# Patient Record
Sex: Female | Born: 1965 | Race: White | Hispanic: No | Marital: Married | Smoking: Never smoker
Health system: Southern US, Community
[De-identification: ages and names within clinical notes are randomized; demographics above are authoritative.]

## PROBLEM LIST (undated history)

## (undated) DIAGNOSIS — R87619 Unspecified abnormal cytological findings in specimens from cervix uteri: Secondary | ICD-10-CM

## (undated) DIAGNOSIS — I10 Essential (primary) hypertension: Secondary | ICD-10-CM

## (undated) DIAGNOSIS — K219 Gastro-esophageal reflux disease without esophagitis: Secondary | ICD-10-CM

## (undated) DIAGNOSIS — R102 Pelvic and perineal pain unspecified side: Secondary | ICD-10-CM

## (undated) DIAGNOSIS — J45909 Unspecified asthma, uncomplicated: Secondary | ICD-10-CM

## (undated) DIAGNOSIS — N8189 Other female genital prolapse: Secondary | ICD-10-CM

## (undated) DIAGNOSIS — Z87442 Personal history of urinary calculi: Secondary | ICD-10-CM

## (undated) DIAGNOSIS — IMO0002 Reserved for concepts with insufficient information to code with codable children: Secondary | ICD-10-CM

## (undated) DIAGNOSIS — N6019 Diffuse cystic mastopathy of unspecified breast: Secondary | ICD-10-CM

## (undated) DIAGNOSIS — N760 Acute vaginitis: Secondary | ICD-10-CM

## (undated) DIAGNOSIS — D3910 Neoplasm of uncertain behavior of unspecified ovary: Secondary | ICD-10-CM

## (undated) DIAGNOSIS — N200 Calculus of kidney: Secondary | ICD-10-CM

## (undated) DIAGNOSIS — S060X9A Concussion with loss of consciousness of unspecified duration, initial encounter: Secondary | ICD-10-CM

## (undated) HISTORY — DX: Acute vaginitis: N76.0

## (undated) HISTORY — DX: Calculus of kidney: N20.0

## (undated) HISTORY — DX: Pelvic and perineal pain: R10.2

## (undated) HISTORY — DX: Reserved for concepts with insufficient information to code with codable children: IMO0002

## (undated) HISTORY — DX: Unspecified abnormal cytological findings in specimens from cervix uteri: R87.619

## (undated) HISTORY — DX: Diffuse cystic mastopathy of unspecified breast: N60.19

## (undated) HISTORY — DX: Personal history of urinary calculi: Z87.442

## (undated) HISTORY — DX: Other female genital prolapse: N81.89

## (undated) HISTORY — DX: Pelvic and perineal pain unspecified side: R10.20

## (undated) HISTORY — DX: Gastro-esophageal reflux disease without esophagitis: K21.9

## (undated) HISTORY — DX: Essential (primary) hypertension: I10

## (undated) HISTORY — PX: CHOLECYSTECTOMY: SHX55

## (undated) HISTORY — DX: Neoplasm of uncertain behavior of unspecified ovary: D39.10

---

## 1998-07-16 ENCOUNTER — Other Ambulatory Visit: Admission: RE | Admit: 1998-07-16 | Discharge: 1998-07-16 | Payer: Self-pay | Admitting: Obstetrics and Gynecology

## 1999-05-06 ENCOUNTER — Encounter: Admission: RE | Admit: 1999-05-06 | Discharge: 1999-05-06 | Payer: Self-pay | Admitting: General Surgery

## 1999-05-06 ENCOUNTER — Encounter: Payer: Self-pay | Admitting: General Surgery

## 1999-09-05 ENCOUNTER — Other Ambulatory Visit: Admission: RE | Admit: 1999-09-05 | Discharge: 1999-09-05 | Payer: Self-pay | Admitting: Obstetrics and Gynecology

## 2000-12-22 ENCOUNTER — Other Ambulatory Visit: Admission: RE | Admit: 2000-12-22 | Discharge: 2000-12-22 | Payer: Self-pay | Admitting: Obstetrics and Gynecology

## 2001-04-15 ENCOUNTER — Encounter: Payer: Self-pay | Admitting: Obstetrics and Gynecology

## 2001-04-15 ENCOUNTER — Ambulatory Visit (HOSPITAL_COMMUNITY): Admission: RE | Admit: 2001-04-15 | Discharge: 2001-04-15 | Payer: Self-pay | Admitting: Obstetrics and Gynecology

## 2002-04-27 ENCOUNTER — Encounter: Admission: RE | Admit: 2002-04-27 | Discharge: 2002-04-27 | Payer: Self-pay | Admitting: Obstetrics and Gynecology

## 2002-04-27 ENCOUNTER — Encounter: Payer: Self-pay | Admitting: Obstetrics and Gynecology

## 2002-06-08 HISTORY — PX: OOPHORECTOMY: SHX86

## 2002-06-14 ENCOUNTER — Other Ambulatory Visit: Admission: RE | Admit: 2002-06-14 | Discharge: 2002-06-14 | Payer: Self-pay | Admitting: Obstetrics and Gynecology

## 2003-06-09 DIAGNOSIS — D3910 Neoplasm of uncertain behavior of unspecified ovary: Secondary | ICD-10-CM

## 2003-06-09 HISTORY — DX: Neoplasm of uncertain behavior of unspecified ovary: D39.10

## 2003-06-19 ENCOUNTER — Other Ambulatory Visit: Admission: RE | Admit: 2003-06-19 | Discharge: 2003-06-19 | Payer: Self-pay | Admitting: Obstetrics and Gynecology

## 2003-06-22 ENCOUNTER — Ambulatory Visit (HOSPITAL_COMMUNITY): Admission: RE | Admit: 2003-06-22 | Discharge: 2003-06-22 | Payer: Self-pay | Admitting: Obstetrics and Gynecology

## 2003-06-29 ENCOUNTER — Ambulatory Visit (HOSPITAL_COMMUNITY): Admission: RE | Admit: 2003-06-29 | Discharge: 2003-06-29 | Payer: Self-pay | Admitting: Obstetrics and Gynecology

## 2003-07-03 ENCOUNTER — Ambulatory Visit: Admission: RE | Admit: 2003-07-03 | Discharge: 2003-07-03 | Payer: Self-pay | Admitting: Gynecology

## 2003-07-10 ENCOUNTER — Inpatient Hospital Stay (HOSPITAL_COMMUNITY): Admission: RE | Admit: 2003-07-10 | Discharge: 2003-07-12 | Payer: Self-pay | Admitting: Obstetrics and Gynecology

## 2003-07-10 ENCOUNTER — Encounter (INDEPENDENT_AMBULATORY_CARE_PROVIDER_SITE_OTHER): Payer: Self-pay | Admitting: Specialist

## 2003-08-07 ENCOUNTER — Ambulatory Visit: Admission: RE | Admit: 2003-08-07 | Discharge: 2003-08-07 | Payer: Self-pay | Admitting: Gynecology

## 2004-06-19 ENCOUNTER — Other Ambulatory Visit: Admission: RE | Admit: 2004-06-19 | Discharge: 2004-06-19 | Payer: Self-pay | Admitting: Obstetrics and Gynecology

## 2004-07-02 ENCOUNTER — Encounter: Admission: RE | Admit: 2004-07-02 | Discharge: 2004-07-02 | Payer: Self-pay | Admitting: Family Medicine

## 2004-09-12 ENCOUNTER — Encounter (INDEPENDENT_AMBULATORY_CARE_PROVIDER_SITE_OTHER): Payer: Self-pay | Admitting: Specialist

## 2004-09-12 ENCOUNTER — Observation Stay (HOSPITAL_COMMUNITY): Admission: RE | Admit: 2004-09-12 | Discharge: 2004-09-13 | Payer: Self-pay | Admitting: General Surgery

## 2004-12-26 ENCOUNTER — Ambulatory Visit (HOSPITAL_COMMUNITY): Admission: RE | Admit: 2004-12-26 | Discharge: 2004-12-26 | Payer: Self-pay | Admitting: Obstetrics and Gynecology

## 2005-07-02 ENCOUNTER — Other Ambulatory Visit: Admission: RE | Admit: 2005-07-02 | Discharge: 2005-07-02 | Payer: Self-pay | Admitting: Obstetrics and Gynecology

## 2006-01-04 ENCOUNTER — Ambulatory Visit (HOSPITAL_COMMUNITY): Admission: RE | Admit: 2006-01-04 | Discharge: 2006-01-04 | Payer: Self-pay | Admitting: Obstetrics and Gynecology

## 2007-01-12 ENCOUNTER — Ambulatory Visit (HOSPITAL_COMMUNITY): Admission: RE | Admit: 2007-01-12 | Discharge: 2007-01-12 | Payer: Self-pay | Admitting: Obstetrics and Gynecology

## 2008-04-19 ENCOUNTER — Ambulatory Visit: Payer: Self-pay | Admitting: Internal Medicine

## 2008-04-20 ENCOUNTER — Ambulatory Visit (HOSPITAL_COMMUNITY): Admission: RE | Admit: 2008-04-20 | Discharge: 2008-04-20 | Payer: Self-pay | Admitting: Obstetrics and Gynecology

## 2010-10-24 NOTE — Op Note (Signed)
NAME:  Dawn Russell, Dawn Russell                         ACCOUNT NO.:  [id number]   MEDICAL RECORD NO.:  [id number]                   PATIENT TYPE:  INP   LOCATION:  0447                                 FACILITY:  [facility]   PHYSICIAN:  Janine Limbo, M.D.            DATE OF BIRTH:  11/07/1965   DATE OF PROCEDURE:  07/10/2003  DATE OF DISCHARGE:                                 OPERATIVE REPORT   PREOPERATIVE DIAGNOSIS:  Right ovarian pelvic mass.   POSTOPERATIVE DIAGNOSIS:  Borderline serous right ovarian carcinoma.   PROCEDURES:  1. Exploratory laparotomy.  2. Right salpingo-oophorectomy.   SURGEON:  Janine Limbo, M.D.   FIRST ASSISTANT:  De Blanch, M.D., and Telford Nab, R.N.   ANESTHESIA:  General.   DISPOSITION:  Dawn Russell is a 45 year old female, para 0-2-0-2, who presents  with a right ovarian mass discovered at her routine annual exam.  Ultrasound  showed an 11.8 x 8.0 cm right adnexal complex cystic mass with nodules.  Her  CA125 was at the upper level of normal at 24.9.  The patient understands the  indications for her surgical procedure and she accepts the risks of, but not  limited to, anesthetic complications, bleeding, infection, and possible  damage to the surrounding organs.  The patient understands that if a  malignancy is found that we may need to proceed with lymph node dissection,  omentectomy, selected pelvic biopsies, and possible bowel surgery.  The  patient admits that she is not yet ready to say that she does not ever want  any more children, and therefore she will not have her tubes tied and will  maintain her potential fertility unless absolutely necessary.   FINDINGS:  The patient was found to have a 12 cm multicystic right adnexal  mass involving the entire right ovary.  The capsule was smooth and there was  no evidence of tumors or excrescences.  The fallopian tubes were normal  bilaterally.  The left ovary was completely normal.   The uterus was  completely normal.  With exploration of the abdomen, the liver was normal.  The surface was smooth, and there was no evidence of tumor.  The omentum was  normal.  The bowel was run and there was no evidence of masses along the  bowel.  The pelvis was carefully inspected, and there was no evidence of  tumor in the pelvis.  The appendix was identified, and it was completely  normal.   DESCRIPTION OF PROCEDURE:  The patient was taken to the operating room,  where a general anesthetic was given.  The patient's abdomen, perineum, and  vagina were prepped with multiple layers of Betadine.  Examination under  anesthesia was performed.  The mass was detected as mentioned above.  A  catheter was placed in the bladder.  The patient was sterilely draped.  A  low transverse incision was made in the abdomen and carried sharply  through  the subcutaneous tissue, the fascia, and the anterior peritoneum.  Washings  were obtained from the pelvis.  The abdominal contents were explored with  findings as mentioned above.  The Bookwalter retractor was used for  retraction.  The mass was elevated into the operative field.  The right  retroperitoneal space was opened and the right infundibulopelvic ligament  was isolated.  The ureter was identified and followed through its course.  The right infundibulopelvic ligament was isolated, doubly clamped, cut, free-  tied, and suture ligated.  The right ovary was then skeletonized and the  right utero-ovarian ligament was then doubly clamped and cut.  The specimen  was carefully inspected and then sent to pathology for frozen section.  The  right utero-ovarian ligament was then doubly sutured and tied.  The pelvis  was carefully explored while we were awaiting the frozen section report.  The frozen section report returned showing a borderline serous ovarian  carcinoma.  There was no evidence of tumor on the external surface of the  ovarian specimen.   This having been completed, the pelvis was vigorously  irrigated.  Hemostasis was again confirmed.  All instruments were removed.  The anterior peritoneum was closed using a running suture of 0 Vicryl.  The  fascia was closed using two running sutures of 0 Vicryl from the corners to  the midline.  A Jackson-Pratt drain was placed in the subcutaneous space and  brought out through the right lower quadrant.  It was sutured into place  using silk suture.  The subcutaneous layer was closed using 2-0 Vicryl.  The  skin was reapproximated using skin staples.  The sponge, needle, and  instrument counts were correct on two occasions.  The estimated blood loss  was 100 mL.  The patient was noted to drain clear yellow urine at the end of  her procedure.  She was awakened from her anesthetic and taken to the  recovery room in stable condition.  The specimen will be re-evaluated for  permanent samples.                                               Janine Limbo, M.D.    AVS/MEDQ  D:  07/10/2003  T:  07/10/2003  Job:  [id number]   cc:   De Blanch, M.D.

## 2010-10-24 NOTE — H&P (Signed)
NAME:  Dawn Russell, Dawn Russell                         ACCOUNT NO.:  [id number]   MEDICAL RECORD NO.:  [id number]                   PATIENT TYPE:  OUT   LOCATION:  MAMO                                 [facility]ACILITY:  [facility]   PHYSICIAN:  Janine Limbo, M.D.            DATE OF BIRTH:  13-May-1966   DATE OF ADMISSION:  07/10/2003  DATE OF DISCHARGE:                                HISTORY & PHYSICAL   HISTORY OF PRESENT ILLNESS:  Dawn Russell is a 45 year old female, para 0-2-0-  2, who presents for an exploratory laparotomy and right salpingo-  oophorectomy.  The patient has been followed at the [facility].  The  patient was in her usual condition of good health when she presented for her  annual exam on June 19, 2003.  She was found to have a cystic right  adnexal mass.  An ultrasound was performed on that date which showed an 11.5  cm cystic mass on the right ovary.  A CT scan with contrast was obtained and  the CT scan showed an 11.8 x 8.0 x 7.3 cm right adnexal complex cystic mass.  The mass is composed of several cystic portions, some of which do contain  mural nodules.  The left ovary measured 3.7 x 3.7 x 4.0 cm.  There was no  evidence of free fluid or pelvic adenopathy.  There was no evidence of  hydronephrosis.  The uterus appeared normal.  The patient's most recent Pap  smear was in January 2005 and it was within normal limits.  The patient uses  rhythm method and condoms for contraception.  The patient did have a CT scan  with contrast in November 2002 because of abdominal pain.  At that time  there was no evidence of acute abnormality in the abdomen.  The right and  left ovary were both said to be slightly enlarged but upon measurement the  right ovary measured 4.8 x 3.5 cm and it contained several follicles.  The  left ovary measured 5.0 x 3.0 cm.  No discrete masses were identified.  The  patient did have a  cesarean section in November 1998 at [redacted] weeks gestation  because of preterm labor and because of a breech presentation.  The  patient's gynecologic history is otherwise largely uncomplicated.  She  denies GI and GU symptomatology other than occasional pelvic discomfort.  She does state for the past 4-6 months that she is sometimes uncomfortable  when she bends over.   OBSTETRICAL HISTORY:  In August 1993 the patient had a vaginal delivery of a  5-pound 13-ounce female infant at [redacted] weeks gestation.  In 1998 the patient had  a cesarean section at 35-1/[redacted] weeks gestation because of a breech infant  where she delivered a 5-pound 2-ounce female infant.   DRUG ALLERGIES:  The patient reports that she is allergic to SULFA  MEDICATIONS and AMOXICILLIN - they cause a rash.   PAST MEDICAL HISTORY:  The patient has asthma and she uses an inhaler p.r.n.  Generally speaking she does well.  Change of temperature can cause her  difficulties.  She has been told in the past that she has sludge in her  gallbladder.  The patient denies hypertension and diabetes.   SOCIAL HISTORY:  The patient denies cigarette use, alcohol use, and  recreational drug use.   REVIEW OF SYSTEMS:  Please see history of present illness.   FAMILY HISTORY:  The patient's father died from an aneurysm.  The patient's  twin sister died from a brain tumor at age 33.   PHYSICAL EXAMINATION:  VITAL SIGNS:  Weight is 161 pounds.  HEENT:  Within normal limits.  CHEST:  Clear.  HEART:  Regular rate and rhythm.  BREASTS:  Fibrocytic changes but no discrete masses are appreciated.  BACK:  No CVA tenderness.  ABDOMEN:  Nontender and no adenopathy is present.  There is not a fluid wave  present.  EXTREMITIES:  Within normal limits.  NEUROLOGIC:  Grossly normal.  PELVIC:  External genitalia is normal.  The vagina is normal except for a  small cystocele and rectocele.  The cervix is nontender and no lesions are  appreciated.  The uterus  is normal size, shape, and consistency.  Adnexa  there is an approximately 8-10 cm right cystic mass present that is not  particularly tender.  The left adnexa is clear and nontender.  Rectovaginal  exam confirms the above.   LABORATORY VALUES:  Hemoccult is negative.  CA 125 is 24.9 (normal).  Her  hemoglobin is 14.7, her hematocrit is 44.9, white blood cell count is  10,000, platelet count is 299,000.  Chemistries are within normal limits  except for a glucose of 113.   ASSESSMENT:  An 11 cm complex cystic mass on the right with mural nodules.  Must rule out ovarian cancer.   PLAN:  1. A long discussion was held with the patient and her husband about     possible etiologies for the mass.  The patient understands that the     lesion may be completely benign and that a right salpingo-oophorectomy     may be all that is required.  The patient also understands that there is     a possibility that there is an ovarian malignancy and that we will need     to proceed with a total abdominal hysterectomy, bilateral salpingo-     oophorectomy, lymph node dissection, omentectomy, tumor debulking, and     possible bowel surgery.  The risks of surgery were reviewed including     anesthetic complications, bleeding, infections, and possible damage to     the surrounding organs.  2. The patient will see Dr. De Blanch (GYN oncologist) in     preparation for surgery.                                               Janine Limbo, M.D.    AVS/MEDQ  D:  06/28/2003  T:  06/29/2003  Job:  [id number]   cc:   De Blanch, M.D.   Leonie Man, M.D.  [address]  [address]  [place]  [place] [address]  Fax: [phone]

## 2010-10-24 NOTE — Consult Note (Signed)
NAME:  Dawn Russell, Dawn Russell                         ACCOUNT NO.:  [id number]   MEDICAL RECORD NO.:  [id number]                   PATIENT TYPE:  INP   LOCATION:  NA                                   FACILITY:  [facility]   PHYSICIAN:  De Blanch, M.D.         DATE OF BIRTH:  1965/10/12   DATE OF CONSULTATION:  DATE OF DISCHARGE:                                   CONSULTATION   A 45 year old white female seen in consultation at the request of Dr. Marline Backbone regarding a newly diagnosed complex mass. The patient apparently  was essentially asymptomatic, but on routine exam was found to have a pelvic  mass. This was further typified with an ultrasound showing an 11.5 cm cystic  mass in the right ovary. CT scan was subsequently obtained showing the right  adnexal complex cystic mass composed of several cystic portions which do  contain some mural nodules. There is no evidence of free fluid or pelvic  adenopathy and the upper abdomen appears to be normal. The patient has had a  CA-125 value which was normal (24.9 units/ml). She does note some pelvic  pressure, but has really no other gynecologic symptoms. Her gynecologic  history is uncomplicated and negative.  Obstetrical history of gravida 2.  She delivered her second child by Cesarean section at 35.5 weeks.   DRUG ALLERGIES:  SULFA and AMOXICILLIN.   PAST SURGICAL HISTORY:  Cesarean section.   PAST MEDICAL HISTORY:  Asthma. She uses an inhaler p.r.n.   SOCIAL HISTORY:  The patient is married. She does not smoke.   REVIEW OF SYSTEMS:  Negative except as noted above.   PHYSICAL EXAMINATION:  VITAL SIGNS: Weight 157 pounds.  GENERAL: The patient is a healthy white female in no acute distress.  HEENT: Negative.  NECK: Supple without thyromegaly. There is no supraclavicular or inguinal  adenopathy.  ABDOMEN: Soft and nontender with no mass, organomegaly, ascites, or hernias  noted.  PELVIC EXAM: EGBUS, vagina, bladder, and  urethra are normal. The cervix is  normal. The uterus is anterior and small. Normal shape, size, and  consistency. There is a cystic mass posterior to the uterus measuring  approximately 9 cm in diameter. This is minimally tender. Rectovaginal exam  confirmatory.   IMPRESSION:  Complex cystic mass with mural nodules in a young woman with  normal CA-125. I believe it is likely this is a benign mass, but nonetheless  it needs to be removed and evaluated pathologically. The patient is strongly  desirous of preserving the uterus and a normal ovary if possible. She  understands that if both ovaries are involved she will have a total  abdominal hysterectomy and bilateral salpingo-oophorectomy.  Further she  understands the extent of surgical staging necessary if this turns out to be  an ovarian cancer which would include pelvic and periaortic lymphadenectomy  and omentectomy, peritoneal washings, and biopsies.   Given the fact that this is  most likely benign, I am comfortable if we make  proceed through a Pfannenstiel incision. The patient understands that if  this is a malignancy and requires surgery in the upper abdomen, a second  midline incision will be performed.   We will proceed to coordinate surgery with Dr. Marline Backbone. The risks of  surgery including hemorrhage, infection, injury to adjacent viscera,  thromboembolic complications, and anesthetic risks were outlined to the  patient and her husband. All their questions were answered.                                               De Blanch, M.D.    DC/MEDQ  D:  07/03/2003  T:  07/03/2003  Job:  [id number]

## 2010-10-24 NOTE — Consult Note (Signed)
NAME:  ELYSABETH, Dawn Russell                         ACCOUNT NO.:  [id number]   MEDICAL RECORD NO.:  [id number]                   PATIENT TYPE:  INP   LOCATION:  0447                                 [facility]ACILITY:  [facility]   PHYSICIAN:  De Blanch, M.D.         DATE OF BIRTH:  March 22, 1966   DATE OF CONSULTATION:  07/10/2003  DATE OF DISCHARGE:                                   CONSULTATION   The patient has been found to have a papillary serous borderline tumor of  the right ovary based on frozen section diagnosis by Dr. Charlott Rakes.  The  patient was explored including the liver capsule, diaphragm, spleen,  stomach, omentum, small and large bowel as well as pelvic peritoneal  surfaces, left tube and ovary, and uterus.  There is no evidence of  intraperitoneal or retroperitoneal adenopathy or other metastatic disease.  Peritoneal washings were previously obtained at the time of opening the  abdomen and have been submitted to cytopathology.   Preoperative discussion with the patient indicated that she strongly desires  to preserving the uterus, tube, and ovary as well as preserving fertility.  Therefore, further surgical resection will not be performed given what would  be expected to be an excellent prognosis of a stage IA serous borderline  tumor with no evidence of gross metastatic disease.                                               De Blanch, M.D.    DC/MEDQ  D:  07/11/2003  T:  07/11/2003  Job:  [id number]   cc:   Telford Nab, R.N.  [address]  [place], [place] [address]

## 2010-10-24 NOTE — Op Note (Signed)
NAMEPAYDEN, BONUS NO.:  [id number]   MEDICAL RECORD NO.:  [id number]          PATIENT TYPE:  AMB   LOCATION:  DAY                          FACILITY:  [facility]   PHYSICIAN:  Adolph Pollack, M.D.DATE OF BIRTH:  09/22/1965   DATE OF PROCEDURE:  09/12/2004  DATE OF DISCHARGE:                                 OPERATIVE REPORT   PREOPERATIVE DIAGNOSIS:  Symptomatic cholelithiasis.   POSTOPERATIVE DIAGNOSIS:  Cholelithiasis and chronic cholecystitis.   PROCEDURE:  Laparoscopic cholecystectomy with intraoperative cholangiogram.   SURGEON:  Adolph Pollack, M.D.   ASSISTANT:  Gabrielle Dare. Janee Morn, M.D.   ANESTHESIA:  General.   INDICATIONS FOR PROCEDURE:  Ms. Durrett is a 45 year old female who is having  episodes of right upper quadrant abdominal pain.  Ultrasound demonstrates  several gallstone and a small gallbladder polyp.  She now presents for  elective laparoscopic cholecystectomy.  The procedure and the risks were  discussed with her preoperatively.   DESCRIPTION OF PROCEDURE:  She was seen in the holding area and then brought  to the operating room and placed supine on the operating table.  General  anesthetic was administered.  Her abdominal wall was then sterilely prepped  and draped.  A dilute Marcaine solution was infiltrated in the subumbilical  area.  She was LATEX SENSITIVE, so latex-free gloves and equipment was used.   A small incision was made in the subumbilical region and carried through the  skin and subcutaneous tissues and fascia.  The peritoneal cavity was entered  under direct vision.  A pursestring suture of 0 Vicryl was placed around the  fascia edges.  A Hasson trocar was introduced to the peritoneal cavity, and  pneumoperitoneum was created by insufflation of CO2 gas.   Next, the laparoscope was introduced.  She was placed in the reverse  Trendelenburg position with the right side tilted slightly upward.  An 11-mm  trocar was  placed through an epigastric incision, and two 5-mm trocars were  placed into the right midlateral abdomen.  The fundus of the gallbladder was  grasped.  Filmy adhesions between the duodenum and omentum were noted, and  these were lysed sharply.  No injury to the duodenum was noted.  The fundus  was then retracted towards the right shoulder.  The infundibulum was grasped  and mobilized using careful blunt dissection and select cautery.  I then  identified the neck of the gallbladder and its junction with the cystic  duct.  I created a window around the cystic duct.  I placed a clip at the  cystic duct/gallbladder junction and made a small incision in the cystic  duct and bile was milked back.  A cholangiocatheter was placed through the  anterior abdominal wall into the cystic duct, and a cholangiogram was  performed.   Under real-time fluoroscopy, dilute contrast material was injected into the  cystic duct which was of moderate length.  The common bile ducts all filled  properly.  Contrast drained into the duodenum rapidly without obvious  evidence of obstruction.  Final report is pending radiologist  interpretation.  Following this, the cholangiocatheter was removed.  The cystic duct was  clipped three times proximally and divided.  An anterior branch of the  cystic artery was identified, clipped, and divided.  The posterior branch  was clipped and divided.  The gallbladder was dissected free from the liver  bed intact with the electrocautery and placed in an Endopouch bag.   The gallbladder fossa was irrigated and bleeding points controlled with the  cautery.  The area was irrigated and inspected again, and no bleeding or  bile leak was noted.  The gallbladder was then removed through the  subumbilical port in the bag.  The subumbilical fascial defect was closed  with no laparoscopic vision by tightening up and tying down the pursestring  suture.  The perihepatic area was irrigated,  and fluid was evacuated.  Following this, the trocars were removed and pneumoperitoneum released.  The  skin incisions were closed with 4-0 Monocryl subcuticular sutures followed  by Steri-Strips and sterile dressings.   She tolerated the procedure without any apparent complications and was taken  to the recovery room in satisfactory condition.      TJR/MEDQ  D:  09/12/2004  T:  09/12/2004  Job:  [id number]   cc:   Caryn Bee L. Little, M.D.  [address]  [place]  [place] [address]  Fax: [phone]

## 2010-10-24 NOTE — Discharge Summary (Signed)
NAME:  Dawn Russell, Dawn Russell                         ACCOUNT NO.:  [id number]   MEDICAL RECORD NO.:  [id number]                   PATIENT TYPE:  INP   LOCATION:  0447                                 FACILITY:  [facility]   PHYSICIAN:  Janine Limbo, M.D.            DATE OF BIRTH:  1966/01/30   DATE OF ADMISSION:  07/10/2003  DATE OF DISCHARGE:  07/12/2003                                 DISCHARGE SUMMARY   DISCHARGE DIAGNOSES:  1. 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  2. Asthma.   PROCEDURES THIS ADMISSION, July 10, 2003:  1. Exploratory laparotomy.  2. Right salpingo-oophorectomy.   HISTORY OF PRESENT ILLNESS:  Ms. Biddinger is a 45 year old female, para 0-2-0-  2, who presented for her routine annual exam in January of 2005 at which  time she was found to have a 10 to 12 cm right adnexal mass.  Ultrasound and  then CT scan confirmed a 12 cm complex cystic mass in the right ovary with  nodules.  Her CA-125 was within normal limits.   PHYSICAL EXAMINATION:  VITAL SIGNS:  Stable.  GENERAL:  Within normal limits.  PELVIC:  Significant for a 10 cm cystic mass in the right adnexa.   HOSPITAL COURSE:  On the day of admission, the patient underwent an  exploratory laparotomy where she was found to have a 12 cm multicystic mass  on the right ovary.  There were no excrescences on the external surface of  the ovary.  The pelvis was explored and there was no evidence of malignancy  in the pelvis or the abdominal cavity.  Washings were obtained that returned  showing benign cells.  Frozen section analysis of the specimen showed a  borderline carcinoma of the ovary.  Because the patient was not ready to end  her childbearing potential, we did not perform Dawn Russell additional surgery.  The  patient's postoperative course was uneventful.  Her postoperative hemoglobin  was 11.4 (preoperative hemoglobin was 13.5).  The patient quickly tolerated  her regular diet here and she remained  afebrile.  She was discharged to home  on postoperative day #2 doing well.   DISCHARGE MEDICATIONS:  The patient was given Vicodin and she will take one  or two tablets every 4 hours as needed for pain.  She was given Phenergan  and she will take 25 mg every 6 hours as needed for nausea.  She will also  take ibuprofen 600 mg q.6 h. as needed for mild to moderate pain.   DISCHARGE INSTRUCTIONS:  The patient will return to see Dr. Stefano Gaul in 4  weeks and Dr. Stanford Breed in 4 weeks for follow up examination.  She will  call for questions and concerns.  She will refrain from driving for 2 weeks,  heavy lifting for 4 weeks, and intercourse for 6 weeks.   FINAL PATHOLOGY REPORT:  Cytology:  No malignant cells identified, right  salpingo-oophorectomy,  benign right tube.  The right ovary has a borderline  papillary serous carcinoma without evidence of spread beyond the capsule.                                               Janine Limbo, M.D.    AVS/MEDQ  D:  07/12/2003  T:  07/12/2003  Job:  [id number]   cc:   De Blanch, M.D.

## 2010-10-24 NOTE — Consult Note (Signed)
NAME:  Dawn Russell, Dawn Russell                         ACCOUNT NO.:  [id number]   MEDICAL RECORD NO.:  [id number]                   PATIENT TYPE:  OUT   LOCATION:  GYN                                  FACILITY:  [facility]   PHYSICIAN:  De Blanch, M.D.         DATE OF BIRTH:  April 23, 1966   DATE OF CONSULTATION:  08/07/2003  DATE OF DISCHARGE:                                   CONSULTATION   REASON FOR CONSULTATION:  A 45 year old white female returns to discuss  pathology obtained at surgery on July 10, 2003.  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  The patient  was desirous of preserving fertility and the contralateral ovary if at all  possible which was achieved.  Final pathology showed that the tumor was  confined to the ovary with no evidence of capsular rupture or peritoneal  implants and washings were negative.   The patient has had an uncomplicated postoperative course.   I had a lengthy discussion with the patient and her husband regarding the  natural history of borderline tumors and recommendations for management.  At  the time being, I indicated that I thought the risk of recurrence is very  low, and would simply plan of her having her undergo ultrasound of the  opposite ovary approximately every six months.  They are aware that  functional cysts would be periodically detected which would need followup.  Given the fact that she did not have an elevated CA-125 preoperatively, I do  not think continuing to follow her with CA-125 values would be of any  significant benefit.  The patient will return to the care of Dr. Stefano Gaul,  and begin this follow up program as outlined above.                                               De Blanch, M.D.    DC/MEDQ  D:  08/07/2003  T:  08/07/2003  Job:  [id number]   cc:   Janine Limbo, M.D.  [address], [address]  [place]  [place] [address]  Fax: [phone]   Telford Nab, R.N.  [address]  [place], [place] [address]

## 2010-12-07 DIAGNOSIS — Z87442 Personal history of urinary calculi: Secondary | ICD-10-CM

## 2010-12-07 HISTORY — DX: Personal history of urinary calculi: Z87.442

## 2010-12-09 ENCOUNTER — Other Ambulatory Visit: Payer: Self-pay | Admitting: Internal Medicine

## 2010-12-11 ENCOUNTER — Ambulatory Visit: Payer: Self-pay | Admitting: Internal Medicine

## 2010-12-11 LAB — CA 125: CA 125: 12 U/mL

## 2010-12-27 LAB — HM PAP SMEAR: HM Pap smear: NORMAL

## 2011-01-01 ENCOUNTER — Ambulatory Visit (HOSPITAL_COMMUNITY): Payer: BC Managed Care – PPO

## 2011-01-01 ENCOUNTER — Ambulatory Visit (HOSPITAL_COMMUNITY)
Admission: RE | Admit: 2011-01-01 | Discharge: 2011-01-01 | Disposition: A | Discharge status: Home / Self Care | Payer: BC Managed Care – PPO | Source: Ambulatory Visit | Attending: Urology | Admitting: Urology

## 2011-01-01 DIAGNOSIS — N200 Calculus of kidney: Secondary | ICD-10-CM | POA: Insufficient documentation

## 2011-01-01 DIAGNOSIS — J701 Chronic and other pulmonary manifestations due to radiation: Secondary | ICD-10-CM | POA: Insufficient documentation

## 2011-01-01 DIAGNOSIS — I10 Essential (primary) hypertension: Secondary | ICD-10-CM | POA: Insufficient documentation

## 2011-01-01 DIAGNOSIS — R310 Gross hematuria: Secondary | ICD-10-CM | POA: Insufficient documentation

## 2011-01-01 DIAGNOSIS — J45909 Unspecified asthma, uncomplicated: Secondary | ICD-10-CM | POA: Insufficient documentation

## 2011-03-24 ENCOUNTER — Other Ambulatory Visit: Payer: Self-pay | Admitting: Internal Medicine

## 2011-03-30 ENCOUNTER — Encounter: Payer: Self-pay | Admitting: Internal Medicine

## 2011-03-30 ENCOUNTER — Other Ambulatory Visit: Payer: Self-pay | Admitting: Internal Medicine

## 2011-03-30 ENCOUNTER — Ambulatory Visit (INDEPENDENT_AMBULATORY_CARE_PROVIDER_SITE_OTHER): Payer: BC Managed Care – PPO | Admitting: Internal Medicine

## 2011-03-30 VITALS — BP 161/93 | HR 93 | Temp 98.4°F | Resp 16 | Wt 167.5 lb

## 2011-03-30 DIAGNOSIS — I10 Essential (primary) hypertension: Secondary | ICD-10-CM | POA: Insufficient documentation

## 2011-03-30 DIAGNOSIS — Z87442 Personal history of urinary calculi: Secondary | ICD-10-CM

## 2011-03-30 DIAGNOSIS — E1159 Type 2 diabetes mellitus with other circulatory complications: Secondary | ICD-10-CM | POA: Insufficient documentation

## 2011-03-30 DIAGNOSIS — J029 Acute pharyngitis, unspecified: Secondary | ICD-10-CM | POA: Insufficient documentation

## 2011-03-30 DIAGNOSIS — Z0000 Encounter for general adult medical examination without abnormal findings: Secondary | ICD-10-CM | POA: Insufficient documentation

## 2011-03-30 DIAGNOSIS — Z13220 Encounter for screening for lipoid disorders: Secondary | ICD-10-CM

## 2011-03-30 DIAGNOSIS — Z1239 Encounter for other screening for malignant neoplasm of breast: Secondary | ICD-10-CM | POA: Insufficient documentation

## 2011-03-30 DIAGNOSIS — N200 Calculus of kidney: Secondary | ICD-10-CM | POA: Insufficient documentation

## 2011-03-30 DIAGNOSIS — Z124 Encounter for screening for malignant neoplasm of cervix: Secondary | ICD-10-CM | POA: Insufficient documentation

## 2011-03-30 MED ORDER — LOSARTAN POTASSIUM 50 MG PO TABS: 50.0000 mg | ORAL_TABLET | Freq: Every day | ORAL | Status: DC

## 2011-03-30 MED ORDER — AZITHROMYCIN 500 MG PO TABS: 500.0000 mg | ORAL_TABLET | Freq: Every day | ORAL | Status: DC

## 2011-03-30 MED ORDER — AZITHROMYCIN 500 MG PO TABS: 500.0000 mg | ORAL_TABLET | Freq: Every day | ORAL | Status: AC

## 2011-03-30 NOTE — Assessment & Plan Note (Signed)
Not well controlled on amlodipine 5 mg daily, and she is having LE edema . Will change therapy to losartan

## 2011-03-30 NOTE — Patient Instructions (Addendum)
I am stopping amlodipine bc of your edema (fluid) and prescribing losartan to take once daily for bp.  Get your  bp checked in 2 weeks.  If bp > 130/80, call office for medication adjustment.   Return in one week for fasting labs.   I am[prescribing azithromycin 500 mg one tablet daily for 7 days for throat and cough/sinuses.  Gargle with salt water 2 or 3 times daily fr sore throatt and use Simply Saline to flushes sinuses AM and PM

## 2011-03-30 NOTE — Progress Notes (Signed)
  Subjective:    Patient ID: Dawn Russell, female    DOB: Jun 25, 1965, 45 y.o.   MRN: [id number]  HPI 45 yo white female with history of renal calculi requiring lithotripsy several months ago  presents with a 4 day shistory of pharyngitis, worse at night, accompaned by feeling of strangling and cough.  Accompanied by cervical lymphadenopathy, malaise despite resting all weekend.  Denies fevers, headaches and nausea. Past Medical History  Diagnosis Date  . History of renal calculi July 2012    s/p lithotripsy Dahlstedt  . Hypertension    No current outpatient prescriptions on file prior to visit.    Review of Systems  Constitutional: Positive for chills and fatigue. Negative for fever and unexpected weight change.  HENT: Positive for sore throat, voice change and postnasal drip. Negative for hearing loss, ear pain, nosebleeds, congestion, facial swelling, rhinorrhea, sneezing, mouth sores, trouble swallowing, neck pain, neck stiffness, sinus pressure, tinnitus and ear discharge.   Eyes: Negative for pain, discharge, redness and visual disturbance.  Respiratory: Negative for cough, chest tightness, shortness of breath, wheezing and stridor.   Cardiovascular: Negative for chest pain, palpitations and leg swelling.  Musculoskeletal: Negative for myalgias and arthralgias.  Skin: Negative for color change and rash.  Neurological: Negative for dizziness, weakness, light-headedness and headaches.  Hematological: Negative for adenopathy.       Objective:   Physical Exam  Constitutional: She is oriented to person, place, and time. She appears well-developed and well-nourished.  HENT:  Mouth/Throat: Oropharynx is clear and moist. No oropharyngeal exudate.  Eyes: EOM are normal. Pupils are equal, round, and reactive to light. No scleral icterus.  Neck: Normal range of motion. Neck supple. No JVD present. No thyromegaly present.  Cardiovascular: Normal rate, regular rhythm, normal heart sounds  and intact distal pulses.   Pulmonary/Chest: Effort normal and breath sounds normal.  Abdominal: Soft. Bowel sounds are normal. She exhibits no mass. There is no tenderness.  Musculoskeletal: Normal range of motion. She exhibits no edema.  Lymphadenopathy:    She has cervical adenopathy.  Neurological: She is alert and oriented to person, place, and time.  Skin: Skin is warm and dry.  Psychiatric: She has a normal mood and affect.          Assessment & Plan:

## 2011-04-10 ENCOUNTER — Other Ambulatory Visit (INDEPENDENT_AMBULATORY_CARE_PROVIDER_SITE_OTHER): Payer: BC Managed Care – PPO | Admitting: *Deleted

## 2011-04-10 DIAGNOSIS — I10 Essential (primary) hypertension: Secondary | ICD-10-CM

## 2011-04-10 DIAGNOSIS — Z13220 Encounter for screening for lipoid disorders: Secondary | ICD-10-CM

## 2011-04-10 LAB — COMPREHENSIVE METABOLIC PANEL
ALT: 33 U/L (ref 0–35)
AST: 25 U/L (ref 0–37)
Albumin: 4.2 g/dL (ref 3.5–5.2)
Alkaline Phosphatase: 75 U/L (ref 39–117)
BUN: 11 mg/dL (ref 6–23)
Potassium: 3.6 mEq/L (ref 3.5–5.1)

## 2011-04-10 LAB — LIPID PANEL
Cholesterol: 165 mg/dL (ref 0–200)
LDL Cholesterol: 91 mg/dL (ref 0–99)
Total CHOL/HDL Ratio: 3
VLDL: 12.4 mg/dL (ref 0.0–40.0)

## 2011-04-28 ENCOUNTER — Telehealth: Payer: Self-pay | Admitting: Internal Medicine

## 2011-04-28 DIAGNOSIS — I10 Essential (primary) hypertension: Secondary | ICD-10-CM

## 2011-04-28 MED ORDER — AMLODIPINE BESYLATE 5 MG PO TABS: 5.0000 mg | ORAL_TABLET | Freq: Every day | ORAL | Status: DC

## 2011-04-28 NOTE — Telephone Encounter (Signed)
[phone] Pt called to give bp readings 2 week after appointment on 10/22   137/83 Today  (Both after lunch) 153/79    142/82

## 2011-04-28 NOTE — Telephone Encounter (Signed)
Those are both elevated,  I recommend starting amlodipine 5 mg daily  Qty #30 2 refills.   Goal bp is 130/80 or less,  Wait a week before rechecking. Will call to pharmacy

## 2011-04-29 NOTE — Telephone Encounter (Signed)
Patient notified, she says that the amlodipine is what she was on before starting the losartan when she was having the swelling in her ankles. She is asking if there is something else she can try.

## 2011-04-29 NOTE — Telephone Encounter (Signed)
Sorry, she can increase the losartan to 100 mg daily (from 50 mg daily).  If that brings bp to 130/80 or less,  We will call in new rx for 100 mg

## 2011-04-29 NOTE — Telephone Encounter (Signed)
Patient notified. She will keep log of her blood pressure readings and call us back if it is working of her for new rx for 100 mg.

## 2011-04-29 NOTE — Telephone Encounter (Signed)
See below Can you help ms Hackworth with this thanks

## 2011-05-08 ENCOUNTER — Telehealth: Payer: Self-pay | Admitting: Internal Medicine

## 2011-05-08 NOTE — Telephone Encounter (Signed)
Patient has yellow moucus coming up ,she is taking Mucinex how long should she continue this before coming in for an appointment.

## 2011-05-11 NOTE — Telephone Encounter (Signed)
Typically viral syndromes can take up to a week to resolve, so I do not treat for bacterail infection suntil sympotms have been lasting longer than one week without imoprvement.

## 2011-05-18 ENCOUNTER — Telehealth: Payer: Self-pay | Admitting: Internal Medicine

## 2011-05-18 DIAGNOSIS — I10 Essential (primary) hypertension: Secondary | ICD-10-CM

## 2011-05-18 MED ORDER — LOSARTAN POTASSIUM 100 MG PO TABS: 100.0000 mg | ORAL_TABLET | Freq: Every day | ORAL | Status: DC

## 2011-05-18 NOTE — Telephone Encounter (Signed)
Patient called and left a voicemail and said she was advised to increase her losartan 50 mg to 2 tablets daily (100 mg) and to call back with readings.  She only left 2 readings and no pulse readings:  135/70    123/74.   She wanted to know if you want her to stay on this dose and if so, she needs a refill for the 100 mg tablet.  Please advise

## 2011-05-18 NOTE — Telephone Encounter (Signed)
rx for 100 mg losartan sent to [facility],  bp is much better

## 2011-05-19 NOTE — Telephone Encounter (Signed)
Tried calling patient, but got no answer, phone just rang and rang with no way to leave message. Will try to call patient again later .

## 2011-05-25 NOTE — Telephone Encounter (Signed)
Patient informed. 

## 2011-05-27 ENCOUNTER — Ambulatory Visit (INDEPENDENT_AMBULATORY_CARE_PROVIDER_SITE_OTHER): Payer: BC Managed Care – PPO | Admitting: *Deleted

## 2011-05-27 DIAGNOSIS — Z23 Encounter for immunization: Secondary | ICD-10-CM

## 2011-07-22 ENCOUNTER — Other Ambulatory Visit: Payer: Self-pay | Admitting: Internal Medicine

## 2011-07-23 ENCOUNTER — Ambulatory Visit (INDEPENDENT_AMBULATORY_CARE_PROVIDER_SITE_OTHER): Payer: BC Managed Care – PPO | Admitting: Internal Medicine

## 2011-07-23 ENCOUNTER — Telehealth: Payer: Self-pay | Admitting: Internal Medicine

## 2011-07-23 ENCOUNTER — Encounter: Payer: Self-pay | Admitting: Internal Medicine

## 2011-07-23 VITALS — BP 130/78 | HR 109 | Temp 98.2°F | Ht 67.0 in | Wt 173.0 lb

## 2011-07-23 DIAGNOSIS — J45901 Unspecified asthma with (acute) exacerbation: Secondary | ICD-10-CM

## 2011-07-23 DIAGNOSIS — J45909 Unspecified asthma, uncomplicated: Secondary | ICD-10-CM | POA: Insufficient documentation

## 2011-07-23 MED ORDER — PREDNISONE (PAK) 10 MG PO TABS: ORAL_TABLET | ORAL | Status: AC

## 2011-07-23 MED ORDER — FLUTICASONE-SALMETEROL 250-50 MCG/DOSE IN AEPB: 1.0000 | INHALATION_SPRAY | Freq: Two times a day (BID) | RESPIRATORY_TRACT | Status: DC

## 2011-07-23 NOTE — Progress Notes (Signed)
Subjective:    Patient ID: Dawn Russell, female    DOB: Jan 27, 1966, 46 y.o.   MRN: [id number]  HPI 46 YO female with history of asthma presents for acute visit complaining of shortness of breath. Patient notes that she first developed symptoms of shortness of breath approximately 4 days ago. She attributed shortness of breath to exposure to cold air given that this has exacerbated her symptoms in the past. She also notes some symptoms of sneezing and runny nose consistent with allergic rhinitis which is also triggered her asthma in the past. She tried to use her albuterol inhaler, but this was out of date, and she noted no improvement with it. She refilled medication and filled the next day, but again noted no improvement. She notes that in the distant past she has had to use inhaled steroids and oral steroids to help resolve acute asthma exacerbation. She has never been hospitalized for asthma exacerbation. She does not smoke and is not exposed to smokers. She has not had any fever or chills. She has not had any sore throat, productive cough, ear pain.  Outpatient Encounter Prescriptions as of 07/23/2011  Medication Sig Dispense Refill  . losartan (COZAAR) 100 MG tablet Take 1 tablet (100 mg total) by mouth daily.  30 tablet  11  . VENTOLIN HFA 108 (90 BASE) MCG/ACT inhaler INHALE TWO PUFFS 4 TIMES DAILY AS NEEDED FOR  WHEEZING  18 g  PRN  . Fluticasone-Salmeterol (ADVAIR DISKUS) 250-50 MCG/DOSE AEPB Inhale 1 puff into the lungs 2 (two) times daily.  1 each  3  . predniSONE (STERAPRED UNI-PAK) 10 MG tablet Take 60mg  day 1 then taper by 10mg  daily  21 tablet  0  . DISCONTD: amLODipine (NORVASC) 5 MG tablet Take 1 tablet (5 mg total) by mouth daily.  30 tablet  11    Review of Systems  Constitutional: Negative for fever, chills and unexpected weight change.  HENT: Positive for rhinorrhea, sneezing and postnasal drip. Negative for hearing loss, ear pain, nosebleeds, congestion, sore throat, facial  swelling, mouth sores, trouble swallowing, neck pain, neck stiffness, voice change, sinus pressure, tinnitus and ear discharge.   Eyes: Negative for pain, discharge, redness and visual disturbance.  Respiratory: Positive for cough and shortness of breath. Negative for chest tightness, wheezing and stridor.   Cardiovascular: Negative for chest pain, palpitations and leg swelling.  Musculoskeletal: Negative for myalgias and arthralgias.  Skin: Negative for color change and rash.  Neurological: Negative for dizziness, weakness, light-headedness and headaches.  Hematological: Negative for adenopathy.   BP 130/78  Pulse 109  Temp(Src) 98.2 F (36.8 C) (Oral)  Ht 5\' 7"  (1.702 m)  Wt 173 lb (78.472 kg)  BMI 27.10 kg/m2  SpO2 98%     Objective:   Physical Exam  Constitutional: She is oriented to person, place, and time. She appears well-developed and well-nourished. No distress.  HENT:  Head: Normocephalic and atraumatic.  Right Ear: External ear normal.  Left Ear: External ear normal.  Nose: Nose normal.  Mouth/Throat: Oropharynx is clear and moist. No oropharyngeal exudate.  Eyes: Conjunctivae are normal. Pupils are equal, round, and reactive to light. Right eye exhibits no discharge. Left eye exhibits no discharge. No scleral icterus.  Neck: Normal range of motion. Neck supple. No tracheal deviation present. No thyromegaly present.  Cardiovascular: Normal rate, regular rhythm, normal heart sounds and intact distal pulses.  Exam reveals no gallop and no friction rub.   No murmur heard. Pulmonary/Chest: Effort normal. No  accessory muscle usage. Not tachypneic. No respiratory distress. She has decreased breath sounds (prolonged expiratory phase with few end expiratory crackles). She has no wheezes. She has no rales. She exhibits no tenderness.  Musculoskeletal: Normal range of motion. She exhibits no edema and no tenderness.  Lymphadenopathy:    She has no cervical adenopathy.    Neurological: She is alert and oriented to person, place, and time. No cranial nerve deficit. She exhibits normal muscle tone. Coordination normal.  Skin: Skin is warm and dry. No rash noted. She is not diaphoretic. No erythema. No pallor.  Psychiatric: She has a normal mood and affect. Her behavior is normal. Judgment and thought content normal.          Assessment & Plan:

## 2011-07-23 NOTE — Telephone Encounter (Signed)
[phone] Pt called wanting to see dr Darrick Huntsman today Pt is having problems asthma

## 2011-07-23 NOTE — Assessment & Plan Note (Signed)
Current exacerbation secondary to both exposure to cold weather and seasonal allergies. Will add Zyrtec. Will start prednisone taper. Will start Advair. Patient will continue to use albuterol as needed for episodes of cough and dyspnea. She will followup in one week for repeat evaluation.

## 2011-07-23 NOTE — Telephone Encounter (Signed)
Appt made with Dr. Dan Humphreys for today.

## 2011-07-30 ENCOUNTER — Encounter: Payer: Self-pay | Admitting: Internal Medicine

## 2011-07-30 ENCOUNTER — Other Ambulatory Visit: Payer: Self-pay | Admitting: Internal Medicine

## 2011-07-30 ENCOUNTER — Ambulatory Visit (INDEPENDENT_AMBULATORY_CARE_PROVIDER_SITE_OTHER): Payer: BC Managed Care – PPO | Admitting: Internal Medicine

## 2011-07-30 VITALS — BP 124/80 | HR 88 | Temp 98.2°F | Wt 172.0 lb

## 2011-07-30 DIAGNOSIS — Z1231 Encounter for screening mammogram for malignant neoplasm of breast: Secondary | ICD-10-CM

## 2011-07-30 DIAGNOSIS — Z124 Encounter for screening for malignant neoplasm of cervix: Secondary | ICD-10-CM

## 2011-07-30 DIAGNOSIS — J45901 Unspecified asthma with (acute) exacerbation: Secondary | ICD-10-CM

## 2011-07-30 MED ORDER — BUDESONIDE 180 MCG/ACT IN AEPB: 1.0000 | INHALATION_SPRAY | Freq: Two times a day (BID) | RESPIRATORY_TRACT | Status: DC

## 2011-08-02 ENCOUNTER — Encounter: Payer: Self-pay | Admitting: Internal Medicine

## 2011-08-02 NOTE — Progress Notes (Signed)
  Subjective:    Patient ID: Dawn Russell, female    DOB: 04-30-1966, 46 y.o.   MRN: [id number]  HPI  46 yr old white female with history of asthma presents  With cough , shortness of breath and wheezing,  Symptoms have been present for 3 or 4 days and have not responded to oral medications. Denies fevers productive cough, sick contacts.    Past Medical History  Diagnosis Date  . History of renal calculi July 2012    s/p lithotripsy Dahlstedt  . Hypertension    Current Outpatient Prescriptions on File Prior to Visit  Medication Sig Dispense Refill  . Fluticasone-Salmeterol (ADVAIR DISKUS) 250-50 MCG/DOSE AEPB Inhale 1 puff into the lungs 2 (two) times daily.  1 each  3  . losartan (COZAAR) 100 MG tablet Take 1 tablet (100 mg total) by mouth daily.  30 tablet  11  . VENTOLIN HFA 108 (90 BASE) MCG/ACT inhaler INHALE TWO PUFFS 4 TIMES DAILY AS NEEDED FOR  WHEEZING  18 g  PRN  . predniSONE (STERAPRED UNI-PAK) 10 MG tablet Take 60mg  day 1 then taper by 10mg  daily  21 tablet  0    Review of Systems     Objective:   Physical Exam  Constitutional: She is oriented to person, place, and time. She appears well-developed and well-nourished.  HENT:  Mouth/Throat: Oropharynx is clear and moist.  Eyes: EOM are normal. Pupils are equal, round, and reactive to light. No scleral icterus.  Neck: Normal range of motion. Neck supple. No JVD present. No thyromegaly present.  Cardiovascular: Normal rate, regular rhythm, normal heart sounds and intact distal pulses.   Pulmonary/Chest: Effort normal and breath sounds normal.  Abdominal: Soft. Bowel sounds are normal. She exhibits no mass. There is no tenderness.  Musculoskeletal: Normal range of motion. She exhibits no edema.  Lymphadenopathy:    She has no cervical adenopathy.  Neurological: She is alert and oriented to person, place, and time.  Skin: Skin is warm and dry.  Psychiatric: She has a normal mood and affect.      Assessment & Plan:

## 2011-08-02 NOTE — Assessment & Plan Note (Signed)
Will treat with prednisone, MDIs .

## 2011-08-25 ENCOUNTER — Ambulatory Visit (HOSPITAL_COMMUNITY): Admission: RE | Admit: 2011-08-25 | Payer: BC Managed Care – PPO | Source: Ambulatory Visit

## 2011-08-25 ENCOUNTER — Ambulatory Visit (HOSPITAL_COMMUNITY)
Admission: RE | Admit: 2011-08-25 | Discharge: 2011-08-25 | Disposition: A | Discharge status: Home / Self Care | Payer: BC Managed Care – PPO | Source: Ambulatory Visit | Attending: Internal Medicine | Admitting: Internal Medicine

## 2011-08-25 DIAGNOSIS — Z1231 Encounter for screening mammogram for malignant neoplasm of breast: Secondary | ICD-10-CM | POA: Insufficient documentation

## 2011-08-26 ENCOUNTER — Other Ambulatory Visit: Payer: Self-pay | Admitting: Internal Medicine

## 2011-08-26 DIAGNOSIS — R928 Other abnormal and inconclusive findings on diagnostic imaging of breast: Secondary | ICD-10-CM

## 2011-08-27 ENCOUNTER — Ambulatory Visit
Admission: RE | Admit: 2011-08-27 | Discharge: 2011-08-27 | Disposition: A | Discharge status: Home / Self Care | Payer: BC Managed Care – PPO | Source: Ambulatory Visit | Attending: Internal Medicine | Admitting: Internal Medicine

## 2011-08-27 DIAGNOSIS — R928 Other abnormal and inconclusive findings on diagnostic imaging of breast: Secondary | ICD-10-CM

## 2011-08-28 ENCOUNTER — Telehealth: Payer: Self-pay | Admitting: Internal Medicine

## 2011-08-28 NOTE — Telephone Encounter (Signed)
Triage Record Num: [id number] Operator: Chevis Pretty Patient Name: Dawn Russell Call Date & Time: 08/28/2011 2:44:09PM Patient Phone: [phone] PCP: Patient Gender: Female PCP Fax : Patient DOB: 08-05-1965 Practice Name: [facility] Reason for Call: Caller: Anaiya/Patient; PCP: Duncan Dull; CB#: [phone]; ; ; Call regarding Cough/Congestion; seen in office 2 weeks ago for URI and got albuterol refill and sample of Symbicort. States has not improved, and new onset 08/26/11 of cough, stuffy head, and coughing on arising. Cough productive of brownish green phlegm. Per protocol, emergent symptoms denied; advised being seen within 24 hours. Appt sched 0915 08/29/11 at [facility] office. Protocol(s) Used: Upper Respiratory Infection (URI) Recommended Outcome per Protocol: See Provider within 24 hours Reason for Outcome: Productive cough with colored sputum (other than clear or white sputum) Symptoms worsen after 7 days or symptoms do not improve after 14 days of home care Care Advice: ~ Use a cool mist humidifier to moisten air. Be sure to clean according to manufacturer's instructions. ~ Rest until symptoms improve. ~ May inhale steam from hot shower or heated water. Be careful to avoid burns. Limit or avoid exposure to irritants and allergens (e.g. air pollution, smoke/smoking, chemicals, dust, pollen, pet dander, etc.) ~ Increase fluids to 8-12 eight oz (1.6 to 2.4 liters) glasses per day, half of them to be water. Soups, popsicles, fruit juices, non-caffeinated sodas (unless restricting sodium intake), jello, broths, decaf teas, etc. are all okay. Warm fluids can be soothing. ~ ~ Consider use of a saline nasal spray per package directions to help relieve nasal congestion. ~ If you can, stop smoking now and avoid all secondhand smoke. ~ Warm fluids may help, or try a mixture of honey and lemon juice in warm tea. ~ HEALTH PROMOTION / MAINTENANCE ~ SYMPTOM /  CONDITION MANAGEMENT ~ INFECTION CONTROL ~ CAUTIONS Coughing up mucus or phlegm helps to get rid of an infection. A productive cough should not be stopped. A cough medicine with guaifenesin (Robitussin, Mucinex) can help loosen the mucus. Cough medicine with dextromethorphan (DM) should be avoided. Drinking lots of fluids can help loosen the mucus too, especially warm fluids. ~ Go to the ED if new onset of stiff neck (unable to touch chin to chest), generalized headache, change in mental status, difficulty opening mouth, unable to swallow liquids or signs of dehydration. ~ Most adults need to drink 6-10 eight-ounce glasses (1.2-2.0 liters) of fluids per day unless previously told to limit fluid intake for other medical reasons. Limit fluids that contain caffeine, sugar or alcohol. Urine will be a very light yellow color when you drink enough fluids. ~ Analgesic/Antipyretic Advice - Acetaminophen: Consider acetaminophen as directed on label or by pharmacist/provider for pain or fever PRECAUTIONS: - Use if there is no history of liver disease, alcoholism, or intake of three or more alcohol drinks per day ~ 08/28/2011 2:55:25PM Page 1 of 2 [id number] [facility] Triage Call Report Patient Name: Tameshia Bonneville continuation page/s - Only if approved by provider during pregnancy or when breastfeeding - During pregnancy, acetaminophen should not be taken more than 3 consecutive days without telling provider - Do not exceed recommended dose or frequency ~ Go to the ED if having chest pain with breathing or breathing is becoming more difficult. Call provider if has a fever over 101.5 F (38.6 C) that has not responded to home care measures, having shaking chills or any fever in someone immunocompromised/frail elderly. ~ Speak with your provider as soon as possible if: - any temperature  elevation in a frail elderly or immunocompromised patient (such as diabetes, HIV/AIDS, renal disease,  chemotherapy, organ transplant, or chronic steroid use). - pregnant and temperature elevation of 100.5 F (38C) or above. - fever does not respond despite 2 doses of fever reducing medication. - fever responds to home care but persists for 3 days or more. ~ Analgesic/Antipyretic Advice - NSAIDs: Consider aspirin, ibuprofen, naproxen or ketoprofen for pain or fever as directed on label or by pharmacist/provider. PRECAUTIONS: - If over 88 years of age, should not take longer than 1 week without consulting provider. EXCEPTIONS: - Should not be used if taking blood thinners or have bleeding problems. - Do not use if have history of sensitivity/allergy to any of these medications; or history of cardiovascular, ulcer, kidney, liver disease or diabetes unless approved by provider. - Do not exceed recommended dose or frequency. ~ Respiratory Hygiene: - Cover the nose/mouth tightly with a tissue when coughing or sneezing. - Use tissue 1 time and discard in the nearest waste receptacle. - Wash hands with soap and water or alcohol-based hand rub after coming into contact with respiratory secretions and contaminated objects/materials. - Alternatively when no tissue is available, cough into the bend of the elbow. - .Avoid touching your eyes, nose or mouth. ~ 03/

## 2011-08-28 NOTE — Telephone Encounter (Signed)
Caller: Tashay/Patient; PCP: Duncan Dull; CB#: [phone]; ; ; Call regarding Cough/Congestion; seen in office 2 weeks ago for URI and got albuterol refill and sample of Symbicort.  States has not improved, and new onset 08/26/11 of cough, stuffy head, and coughing on arising.  Cough productive of brownish green phlegm.  Per protocol, emergent symptoms denied; advised being seen within 24 hours.  Appt sched 0915 08/29/11 at [facility] office.

## 2011-08-29 ENCOUNTER — Ambulatory Visit (INDEPENDENT_AMBULATORY_CARE_PROVIDER_SITE_OTHER): Payer: BC Managed Care – PPO | Admitting: Family Medicine

## 2011-08-29 ENCOUNTER — Encounter: Payer: Self-pay | Admitting: Family Medicine

## 2011-08-29 VITALS — BP 130/82 | Temp 98.0°F | Wt 173.0 lb

## 2011-08-29 DIAGNOSIS — J45901 Unspecified asthma with (acute) exacerbation: Secondary | ICD-10-CM

## 2011-08-29 DIAGNOSIS — J069 Acute upper respiratory infection, unspecified: Secondary | ICD-10-CM

## 2011-08-29 MED ORDER — PREDNISONE 20 MG PO TABS: ORAL_TABLET | ORAL | Status: DC

## 2011-08-29 NOTE — Progress Notes (Signed)
OFFICE NOTE  08/29/2011  CC:  Chief Complaint  Patient presents with  . Cough    congestion     HPI: Patient is a 46 y.o. Caucasian female who is here for sinus congestion. Pt presents complaining of respiratory symptoms for 4 days.  Mostly nasal congestion/runny nose, sneezing, and PND cough.  Worst symptoms seems to be the coughing, chest tight--using albuterol but not wheezing.  Lately the symptoms seem to be stable. No fevers, no wheezing, and no SOB.  No pain in face or teeth.  No significant HA.  ST mild at most.  Symptoms made worse in morning and night.  Symptoms improved by ventolin. Smoker? no Recent sick contact? Not known Muscle or joint aches? no Flu shot this season at least 2 wks ago? yes  ROS: no n/v/d or abdominal pain.  No rash.  No neck stiffness.   +Mild fatigue.  +Mild appetite loss. '  Pertinent PMH:  Asthma, persistent HTN MEDS:  Outpatient Prescriptions Prior to Visit  Medication Sig Dispense Refill  . budesonide (PULMICORT FLEXHALER) 180 MCG/ACT inhaler Inhale 1 puff into the lungs 2 (two) times daily.  1 Inhaler  6  . Fluticasone-Salmeterol (ADVAIR DISKUS) 250-50 MCG/DOSE AEPB Inhale 1 puff into the lungs 2 (two) times daily.  1 each  3  . losartan (COZAAR) 100 MG tablet Take 1 tablet (100 mg total) by mouth daily.  30 tablet  11  . VENTOLIN HFA 108 (90 BASE) MCG/ACT inhaler INHALE TWO PUFFS 4 TIMES DAILY AS NEEDED FOR  WHEEZING  18 g  PRN  *Pt taking pulmicort last few days and ventolin qid last few days.  Not taking advair.   PE: Blood pressure 130/82, temperature 98 F (36.7 C), temperature source Oral, weight 173 lb (78.472 kg), last menstrual period 08/24/2011. VS: noted--normal. Gen: alert, NAD, NONTOXIC APPEARING. HEENT: eyes without injection, drainage, or swelling.  Ears: EACs clear, TMs with normal light reflex and landmarks.  Nose: Clear rhinorrhea, with some dried, crusty exudate adherent to mildly injected mucosa.  No purulent d/c.  No  paranasal sinus TTP.  No facial swelling.  Throat and mouth without focal lesion.  No pharyngial swelling, erythema, or exudate.   Neck: supple, no LAD.   LUNGS: CTA bilat, nonlabored resps.  +Prolonged exp phase with decreased aeration MINIMALLY on exhalation.  No wheezing. CV: RRR, no m/r/g. EXT: no c/c/e SKIN: no rash    IMPRESSION AND PLAN: 1) Viral URI.  Self-limited nature of this illness was discussed, questions answered.  Discussed symptomatic care, rest, fluids.   Warning signs/symptoms of worsening illness were discussed.  Patient instructed to call or return if any of these occur. 2) Mild acute asthma flare--pt wishes to avoid systemic steroids if at all possible so we went with a switch to advair 250/50 short term, 1 p bid today (stop pulmicort), gave rx of prednisone 40mg  qd x 5d to fill in 2d if not improving, see Dr. Darrick Huntsman if worsening. Continue ventolin q4h prn, add saline nasal spray.  FOLLOW UP: prn

## 2011-08-31 ENCOUNTER — Telehealth: Payer: Self-pay | Admitting: Internal Medicine

## 2011-08-31 MED ORDER — PREDNISONE 10 MG PO TABS: ORAL_TABLET | ORAL | Status: DC

## 2011-08-31 NOTE — Telephone Encounter (Signed)
Patient notified of taper she stated she would rather do what Dr. Darrick Huntsman says so she will pick up the Rx.  Rx has been called in.

## 2011-08-31 NOTE — Telephone Encounter (Signed)
Typically I will do a prednisone taper using 10 mg tables  starting at 60 mg,  Reduce by 10 mg daily until gone #21 no refills Ok to call in if she would prefer that.

## 2011-08-31 NOTE — Telephone Encounter (Signed)
[facility] Triage Call Report Triage Record Num: [id number] Operator: Arline Asp Loftin Patient Name: Dawn Russell Call Date & Time: 08/31/2011 10:51:02AM Patient Phone: [phone] PCP: Duncan Dull Patient Gender: Female PCP Fax : [phone] Patient DOB: 1966/01/08 Practice Name: [facility] Reason for Call: Caller: Henlee/Patient; PCP: Duncan Dull; CB#: [phone]; LMP: 03/18; Call regarding "Patient Put On Steroids for Cough and Is SOB," THE PATIENT REFUSED 911. Seen 03/23 for coughing, saw Dr. Marvel Plan, received Rx for Steroids 20mg  to "use 2 daily if coughing worsens." Per caller, this is "different than what Dr. Darrick Huntsman normally prescribes." Caller simply wants to confirm with T. Tullo if this is the dosage she needs." PLEASE CALL MS Fan AT [phone] AND ADVISE. RN has no remote access to [facility], unable to send message except per [facility]. Protocol(s) Used: Office Note Recommended Outcome per Protocol: Information Noted and Sent to Office Reason for Outcome: Caller information to office Care Advice: ~ 08/31/2011 11:02:19AM Page 1 of 1 [id number]

## 2011-08-31 NOTE — Telephone Encounter (Signed)
From Call A Nurse: saw Dr. Marvel Plan, received Rx for Steroids 20mg  to "use 2 daily  if coughing worsens." Per caller, this is "different than what Dr. Darrick Huntsman normally prescribes."  Caller simply wants to confirm with T. Tullo if this is the dosage she needs."

## 2011-11-09 ENCOUNTER — Telehealth: Payer: Self-pay | Admitting: Internal Medicine

## 2011-11-09 NOTE — Telephone Encounter (Signed)
Caller: Dawn Russell/Patient; PCP: Duncan Dull; CB#: [phone]. Call regarding Ha/Congestion. Caller reports she has had some sinus pain and pressure for the past 4-5 weeks. Caller reports she was seen by Dentist appx a month ago for pain in her teeth. Xrays showed a "cloudy area" over her sinuses. She was advised to call MD to advise. Freq headache, feeling bad in general.  Per Face Pain or Swelling Protocol, Caller advised she should be seen for eval of sxs. She is agreeable, appt scheduled with Dr Dan Humphreys for Tues 6/4 at 8:30.

## 2011-11-10 ENCOUNTER — Ambulatory Visit (INDEPENDENT_AMBULATORY_CARE_PROVIDER_SITE_OTHER): Payer: BC Managed Care – PPO | Admitting: Internal Medicine

## 2011-11-10 ENCOUNTER — Encounter: Payer: Self-pay | Admitting: Internal Medicine

## 2011-11-10 VITALS — BP 130/80 | HR 81 | Temp 98.7°F | Ht 67.0 in | Wt 174.8 lb

## 2011-11-10 DIAGNOSIS — J320 Chronic maxillary sinusitis: Secondary | ICD-10-CM

## 2011-11-10 MED ORDER — LEVOFLOXACIN 750 MG PO TABS: 750.0000 mg | ORAL_TABLET | Freq: Every day | ORAL | Status: AC

## 2011-11-10 NOTE — Assessment & Plan Note (Signed)
Symptoms consistent with left maxillary sinusitis. Will treat with Levaquin. Patient will use ibuprofen 800 mg 2-3 times daily over the next couple of days to help with inflammation. She will call or return to clinic if symptoms are not improving.

## 2011-11-10 NOTE — Progress Notes (Signed)
Subjective:    Patient ID: Dawn Russell, female    DOB: 26-Sep-1965, 46 y.o.   MRN: [id number]  HPI 46 year old female with history of hypertension presents for acute visit complaining of several week history of left-sided facial pressure and upper tooth pain. She reports that she was seen by her dentist because she was concerned that she had a cavity or abscess. Her dentist performed x-rays of her left upper teeth which showed opacification of her maxillary sinus. Her dentist recommended taking ibuprofen to help with inflammation and drainage. She used some ibuprofen with minimal improvement in her symptoms. She reports minimal sinus drainage. She has not had fever or chills. She does report significant fatigue and occasional sweats. She has not had any cough or shortness of breath.  Outpatient Encounter Prescriptions as of 11/10/2011  Medication Sig Dispense Refill  . cetirizine (ZYRTEC) 10 MG tablet Take 10 mg by mouth daily.      Marland Kitchen losartan (COZAAR) 100 MG tablet Take 1 tablet (100 mg total) by mouth daily.  30 tablet  11  . VENTOLIN HFA 108 (90 BASE) MCG/ACT inhaler INHALE TWO PUFFS 4 TIMES DAILY AS NEEDED FOR  WHEEZING  18 g  PRN  . levofloxacin (LEVAQUIN) 750 MG tablet Take 1 tablet (750 mg total) by mouth daily.  7 tablet  0    Review of Systems  Constitutional: Positive for diaphoresis. Negative for fever, chills and unexpected weight change.  HENT: Positive for congestion, dental problem and sinus pressure. Negative for hearing loss, ear pain, nosebleeds, sore throat, facial swelling, rhinorrhea, sneezing, mouth sores, trouble swallowing, neck pain, neck stiffness, voice change, postnasal drip, tinnitus and ear discharge.   Eyes: Negative for pain, discharge, redness and visual disturbance.  Respiratory: Negative for cough, chest tightness, shortness of breath and wheezing.   Cardiovascular: Negative for chest pain, palpitations and leg swelling.  Musculoskeletal: Negative for myalgias  and arthralgias.  Skin: Negative for color change and rash.  Neurological: Positive for headaches. Negative for dizziness, weakness and light-headedness.  Hematological: Negative for adenopathy.   BP 130/80  Pulse 81  Temp(Src) 98.7 F (37.1 C) (Oral)  Ht 5\' 7"  (1.702 m)  Wt 174 lb 12 oz (79.266 kg)  BMI 27.37 kg/m2  SpO2 96%  LMP 11/09/2011     Objective:   Physical Exam  Constitutional: She is oriented to person, place, and time. She appears well-developed and well-nourished. No distress.  HENT:  Head: Normocephalic and atraumatic.  Right Ear: Tympanic membrane and external ear normal. Tympanic membrane is not erythematous and not bulging.  Left Ear: External ear normal. Tympanic membrane is bulging. Tympanic membrane is not erythematous.  Nose: Right sinus exhibits no maxillary sinus tenderness and no frontal sinus tenderness. Left sinus exhibits maxillary sinus tenderness. Left sinus exhibits no frontal sinus tenderness.  Mouth/Throat: Oropharynx is clear and moist. No oropharyngeal exudate.  Eyes: Conjunctivae are normal. Pupils are equal, round, and reactive to light. Right eye exhibits no discharge. Left eye exhibits no discharge. No scleral icterus.  Neck: Normal range of motion. Neck supple. No tracheal deviation present. No thyromegaly present.  Cardiovascular: Normal rate, regular rhythm, normal heart sounds and intact distal pulses.  Exam reveals no gallop and no friction rub.   No murmur heard. Pulmonary/Chest: Effort normal and breath sounds normal. No respiratory distress. She has no wheezes. She has no rales. She exhibits no tenderness.  Musculoskeletal: Normal range of motion. She exhibits no edema and no tenderness.  Lymphadenopathy:  She has no cervical adenopathy.  Neurological: She is alert and oriented to person, place, and time. No cranial nerve deficit. She exhibits normal muscle tone. Coordination normal.  Skin: Skin is warm and dry. No rash noted. She is  not diaphoretic. No erythema. No pallor.  Psychiatric: She has a normal mood and affect. Her behavior is normal. Judgment and thought content normal.          Assessment & Plan:

## 2011-12-01 ENCOUNTER — Ambulatory Visit (INDEPENDENT_AMBULATORY_CARE_PROVIDER_SITE_OTHER): Payer: BC Managed Care – PPO | Admitting: Internal Medicine

## 2011-12-01 ENCOUNTER — Encounter: Payer: Self-pay | Admitting: Internal Medicine

## 2011-12-01 VITALS — BP 112/72 | HR 95 | Temp 98.0°F | Resp 16 | Wt 175.2 lb

## 2011-12-01 DIAGNOSIS — H6690 Otitis media, unspecified, unspecified ear: Secondary | ICD-10-CM | POA: Insufficient documentation

## 2011-12-01 DIAGNOSIS — E663 Overweight: Secondary | ICD-10-CM | POA: Insufficient documentation

## 2011-12-01 DIAGNOSIS — I10 Essential (primary) hypertension: Secondary | ICD-10-CM

## 2011-12-01 DIAGNOSIS — Z6825 Body mass index (BMI) 25.0-25.9, adult: Secondary | ICD-10-CM

## 2011-12-01 MED ORDER — AZITHROMYCIN 500 MG PO TABS: 500.0000 mg | ORAL_TABLET | Freq: Every day | ORAL | Status: AC

## 2011-12-01 MED ORDER — LOSARTAN POTASSIUM 100 MG PO TABS: 100.0000 mg | ORAL_TABLET | Freq: Every day | ORAL | Status: DC

## 2011-12-01 NOTE — Assessment & Plan Note (Signed)
Left eardrum remain cloudy suggesting effusion., despite tx with levaquin.  Has PCN allergy.  Will start azithromycin and add decongestant.

## 2011-12-01 NOTE — Assessment & Plan Note (Signed)
BMI acknowledged. Weight status was not addressed at today's visit due to acute nature of evaluation

## 2011-12-01 NOTE — Progress Notes (Signed)
Patient ID: Dawn Russell, female   DOB: 09-26-1965, 46 y.o.   MRN: [id number]  Patient Active Problem List  Diagnosis  . History of renal calculi  . Hypertension  . Screening for breast cancer  . Screening for cervical cancer  . Left maxillary sinusitis  . Otitis media    Subjective:  CC:   Chief Complaint  Patient presents with  . Follow-up    HPI:   Dawn Russell a 46 y.o. female who presents for follow up on left maxillary sinusitis diganosed by dentist,  Evaluated by Dr. Dan Humphreys and found to have concurrent otitis media.  Asymptomatic except for recent onset of sweats.  Was treated with levaquin.  Did not take a decongestant since she had no symptoms,  Never had pain.     Past Medical History  Diagnosis Date  . History of renal calculi July 2012    s/p lithotripsy Dahlstedt  . Hypertension     Past Surgical History  Procedure Date  . Oophorectomy 2004    due to large benign cyst,  elevated CA125         The following portions of the patient's history were reviewed and updated as appropriate: Allergies, current medications, and problem list.    Review of Systems:  HEENT  review of systems was negative except those addressed in the HPI,     History   Social History  . Marital Status: Married    Spouse Name: N/A    Number of Children: N/A  . Years of Education: N/A   Occupational History  . Not on file.   Social History Main Topics  . Smoking status: Never Smoker   . Smokeless tobacco: Never Used  . Alcohol Use: No  . Drug Use: No  . Sexually Active: Not on file   Other Topics Concern  . Not on file   Social History Narrative  . No narrative on file    Objective:  BP 112/72  Pulse 95  Temp 98 F (36.7 C) (Oral)  Resp 16  Wt 175 lb 4 oz (79.493 kg)  SpO2 96%  LMP 11/09/2011  General appearance: alert, cooperative and appears stated age Ears: TM on left is opaque, with evience of recent rupture.  Right TM shiny  Throat:  lips, mucosa, and tongue normal; teeth and gums normal Neck: no adenopathy, no carotid bruit, supple, symmetrical, trachea midline and thyroid not enlarged, symmetric, no tenderness/mass/nodules Back: symmetric, no curvature. ROM normal. No CVA tenderness. Lungs: clear to auscultation bilaterally Heart: regular rate and rhythm, S1, S2 normal, no murmur, click, rub or gallop Abdomen: soft, non-tender; bowel sounds normal; no masses,  no organomegaly Lymph nodes: Cervical, supraclavicular, and axillary nodes normal.  Assessment and Plan:  Otitis media Left eardrum remain cloudy suggesting effusion., despite tx with levaquin.  Has PCN allergy.  Will start azithromycin and add decongestant.    Updated Medication List Outpatient Encounter Prescriptions as of 12/01/2011  Medication Sig Dispense Refill  . cetirizine (ZYRTEC) 10 MG tablet Take 10 mg by mouth daily.      Marland Kitchen losartan (COZAAR) 100 MG tablet Take 1 tablet (100 mg total) by mouth daily.  90 tablet  3  . VENTOLIN HFA 108 (90 BASE) MCG/ACT inhaler INHALE TWO PUFFS 4 TIMES DAILY AS NEEDED FOR  WHEEZING  18 g  PRN  . DISCONTD: losartan (COZAAR) 100 MG tablet Take 1 tablet (100 mg total) by mouth daily.  30 tablet  11  . azithromycin (  ZITHROMAX) 500 MG tablet Take 1 tablet (500 mg total) by mouth daily.  5 tablet  0  . DISCONTD: cetirizine (ZYRTEC) 10 MG tablet Take 10 mg by mouth daily.         No orders of the defined types were placed in this encounter.    No Follow-up on file.

## 2011-12-01 NOTE — Patient Instructions (Addendum)
Your eardrum is better but not normal.  Please take  Sudafed PE  10  Mg every 6 hours for a few days along with 7 days of azithromycin   Flush sinuses with simply saline twice daily for as long as you can tolerate   Take sudafed PE with you to [facility] and take a dose before you board the plane

## 2011-12-29 ENCOUNTER — Telehealth: Payer: Self-pay | Admitting: Internal Medicine

## 2011-12-29 NOTE — Telephone Encounter (Signed)
error 

## 2011-12-29 NOTE — Telephone Encounter (Signed)
Caller: [place]/Patient; PCP: Duncan Dull; CB#: [phone]; ; ; Call regarding Ear Infection;  seen by Dr. Dan Humphreys in June 2013 for sinus infection and ear infection.  States her ear drum was "ready to burst."  States she took antibiotics/Levaquin, and when rechecked 3 weeks later, her ears looked "okay," though not completely cleared.  Took sudafed PE during travel to [place].  L ear did hurt after she returned from [facility] 12/23/11.  Onset 12/29/11 of restart of L earache, with "pain going down the neck."  Using saline spray in sinuses as well.  Afebrile.   Per protocol, advised appt within 24 hours; no appt available in office within designated time frame.  Advised UC; patient will go to UC.

## 2011-12-30 ENCOUNTER — Telehealth: Payer: Self-pay | Admitting: Internal Medicine

## 2011-12-30 DIAGNOSIS — H6690 Otitis media, unspecified, unspecified ear: Secondary | ICD-10-CM

## 2011-12-30 NOTE — Telephone Encounter (Signed)
Left detailed message notifying patient.

## 2011-12-30 NOTE — Telephone Encounter (Signed)
Caller: September/Patient; Phone Number: [phone]; Message from caller: Pt calling today 12/30/11 regarding she called yesterday and talked with nurse Wynona Canes regarding has been having ear infection problems since June, she went to walk in clinic yesterday and was prescribed Omnicef, Flonase, Tramadol.  MD also told her to take Muccinex D.  2 Weeks ago Dr.  Darrick Huntsman told her to take Sudafed PE.  Also told her they were going to make appt for an ENT.  Pt wants to know if she should take the Muccinex D or the Sudafed PE that Dr.  Darrick Huntsman had advised earlier.  She does not like taking a lot of meds.  PLEASE CALL PT BACK [phone] ADVISE.

## 2011-12-30 NOTE — Telephone Encounter (Signed)
Sudafed PE instead of mucinex D.   referral made to [facility] ENT

## 2012-01-11 ENCOUNTER — Telehealth: Payer: Self-pay | Admitting: Obstetrics and Gynecology

## 2012-01-11 NOTE — Telephone Encounter (Signed)
Triage/appt

## 2012-01-12 ENCOUNTER — Encounter: Payer: Self-pay | Admitting: Obstetrics and Gynecology

## 2012-01-12 ENCOUNTER — Ambulatory Visit (INDEPENDENT_AMBULATORY_CARE_PROVIDER_SITE_OTHER): Payer: BC Managed Care – PPO | Admitting: Obstetrics and Gynecology

## 2012-01-12 VITALS — BP 116/72 | Wt 174.0 lb

## 2012-01-12 DIAGNOSIS — N898 Other specified noninflammatory disorders of vagina: Secondary | ICD-10-CM

## 2012-01-12 DIAGNOSIS — B373 Candidiasis of vulva and vagina: Secondary | ICD-10-CM

## 2012-01-12 MED ORDER — NYSTATIN-TRIAMCINOLONE 100000-0.1 UNIT/GM-% EX OINT: TOPICAL_OINTMENT | CUTANEOUS | Status: DC

## 2012-01-12 NOTE — Patient Instructions (Signed)
Avoid: - excess soap on genital area (consider using plain oatmeal soap) - use of powder or sprays in genital area - douching - wearing underwear to bed (except with menses) - using more than is directed detergent when washing clothes - tight fitting garments around genital area - excess sugar intake   

## 2012-01-12 NOTE — Progress Notes (Signed)
Color: white Odor: no Itching:yes Thin:yes Thick:no Fever:no Dyspareunia:no Hx PID:no HX STD:no Pelvic Pain:no Desires Gc/CT:no Desires HIV,RPR,HbsAG:no

## 2012-01-12 NOTE — Progress Notes (Signed)
46 YO with complaints of pruritic vaginal discharge that she treated with one day Monistat that is now worse.Has been on antibiotics for months due to refractory ear infection. Most recently Omnicef x 10 days  (is to see ENT this week).  Used some old cream on outside that helped (previously given for yeast).   O: Pelvic: EGBUS-inflamed with mild edema, vagina-white discharge, cervix/uterus-normal, adnexae-no tenderness  Wet Prep: pH-5.0,  whiff-negative,  obscured by Monistat  A: Vulvovaginitis  P:  Lotrisone Cream 15 grams apply to external vaginal area bid x 7-14 days no refills       Perineal hygiene       RTO-as scheduled or prn  Dawn Greggs, PA-C

## 2012-02-25 ENCOUNTER — Ambulatory Visit (INDEPENDENT_AMBULATORY_CARE_PROVIDER_SITE_OTHER): Payer: BC Managed Care – PPO | Admitting: Obstetrics and Gynecology

## 2012-02-25 ENCOUNTER — Encounter: Payer: Self-pay | Admitting: Obstetrics and Gynecology

## 2012-02-25 VITALS — BP 124/72 | Wt 172.0 lb

## 2012-02-25 DIAGNOSIS — Z124 Encounter for screening for malignant neoplasm of cervix: Secondary | ICD-10-CM

## 2012-02-25 DIAGNOSIS — B373 Candidiasis of vulva and vagina: Secondary | ICD-10-CM

## 2012-02-25 MED ORDER — FLUCONAZOLE 150 MG PO TABS: 150.0000 mg | ORAL_TABLET | Freq: Every day | ORAL | Status: DC

## 2012-02-25 NOTE — Progress Notes (Signed)
HISTORY OF PRESENT ILLNESS  [name] is a 46 y.o. year old female,G2P0, who presents for a problem visit. The patient has a history of ASCUS Pap smears.  Subjective:  She is doing well.  Objective:  BP 124/72  Wt 172 lb (78.019 kg)  LMP 02/17/2012   General: no distress GI: soft and nontender  External genitalia: normal general appearance Vaginal: relaxation noted Cervix: normal appearance Adnexa: normal bimanual exam Uterus: upper limits normal size  Assessment:  History of ascus Pap  Plan:  Pap smear sent  Return to office in 6 month(s) for annual exam.   Leonard Schwartz M.D.  02/25/2012 9:27 AM   Last Pap Normal: yes Date: 06/22/11 Grade: n/a High Risk HPV: no Vaginal Discharge:no Prior LEEP:no Prior Conization:no Prior Cryotherapy:no Prior Lazer:no

## 2012-02-29 LAB — PAP IG W/ RFLX HPV ASCU

## 2012-03-01 LAB — HUMAN PAPILLOMAVIRUS, HIGH RISK: HPV DNA High Risk: DETECTED — AB

## 2012-03-14 ENCOUNTER — Telehealth: Payer: Self-pay

## 2012-03-14 NOTE — Telephone Encounter (Signed)
Per AVS pt needs colpo. Pt is on cycle this week. Colpo scheduled Tuesday 03/22/2012. W/AVS per pt request. Pt agreeable.  [facility] CMA

## 2012-03-22 ENCOUNTER — Encounter: Payer: Self-pay | Admitting: Obstetrics and Gynecology

## 2012-03-22 ENCOUNTER — Ambulatory Visit (INDEPENDENT_AMBULATORY_CARE_PROVIDER_SITE_OTHER): Payer: BC Managed Care – PPO | Admitting: Obstetrics and Gynecology

## 2012-03-22 VITALS — BP 144/70 | Ht 67.0 in | Wt 172.0 lb

## 2012-03-22 DIAGNOSIS — M545 Low back pain, unspecified: Secondary | ICD-10-CM

## 2012-03-22 DIAGNOSIS — R6889 Other general symptoms and signs: Secondary | ICD-10-CM

## 2012-03-22 DIAGNOSIS — B977 Papillomavirus as the cause of diseases classified elsewhere: Secondary | ICD-10-CM

## 2012-03-22 DIAGNOSIS — IMO0001 Reserved for inherently not codable concepts without codable children: Secondary | ICD-10-CM

## 2012-03-22 LAB — POCT URINALYSIS DIPSTICK
Ketones, UA: NEGATIVE
Leukocytes, UA: NEGATIVE
Nitrite, UA: NEGATIVE
Protein, UA: NEGATIVE
pH, UA: 5

## 2012-03-22 LAB — POCT URINE PREGNANCY: Preg Test, Ur: NEGATIVE

## 2012-03-22 NOTE — Progress Notes (Signed)
HISTORY OF PRESENT ILLNESS  [name] is a 46 y.o. year old female,G2P0, who presents for a problem visit. The patient has a history of ASCUS Pap smears.  Her HPV test have been negative in the past.  However, with her last Pap smear her HPV was positive.  Subjective:  The patient is very upset about her diagnosis.  Objective:  BP 144/70  Ht 5\' 7"  (1.702 m)  Wt 172 lb (78.019 kg)  BMI 26.94 kg/m2  LMP 03/14/2012   GI: soft and nontender  External genitalia: normal general appearance Vaginal: atrophic mucosa and relaxation noted Cervix: see colposcopy note Adnexa: normal bimanual exam Uterus: upper limits normal size  COLPOSCOPY NOTE:  The colposcopy procedure was explained.  The patient's questions were answered. A speculum exam was performed.  The cervix was prepped with acetic acid and Hurricaine gel.  The cervix was evaluated using a white light and the green filter. Findings: white epithelium noted at 5:00 .  The endocervical canal was clear.  No lesions were seen.  Biopsies obtained: 5:00.  Hemostasis was adequate.  An endocervical curettage was performed.  Again, hemostasis was adequate. The procedure was terminated.  The patient tolerated her procedure well.  The specimens were sent to pathology.   Assessment:  Recurrent ascus Pap smear Positive HPV  Plan:  Biopsies at 5:00 and ECC sent to pathology. Abnormal Pap smears and HPV discussed.  25 min. Visit with greater than 50% being face-to-face.  Return to office in 2 week(s).   Leonard Schwartz M.D.  03/22/2012 9:38 PM

## 2012-04-05 ENCOUNTER — Ambulatory Visit (INDEPENDENT_AMBULATORY_CARE_PROVIDER_SITE_OTHER): Payer: BC Managed Care – PPO | Admitting: Obstetrics and Gynecology

## 2012-04-05 ENCOUNTER — Telehealth: Payer: Self-pay | Admitting: Internal Medicine

## 2012-04-05 ENCOUNTER — Encounter: Payer: Self-pay | Admitting: Obstetrics and Gynecology

## 2012-04-05 VITALS — BP 136/80 | Ht 67.0 in | Wt 171.0 lb

## 2012-04-05 DIAGNOSIS — N870 Mild cervical dysplasia: Secondary | ICD-10-CM

## 2012-04-05 DIAGNOSIS — A630 Anogenital (venereal) warts: Secondary | ICD-10-CM

## 2012-04-05 DIAGNOSIS — B977 Papillomavirus as the cause of diseases classified elsewhere: Secondary | ICD-10-CM

## 2012-04-05 NOTE — Telephone Encounter (Signed)
Caller: Liah/Patient; Patient Name: Dawn Russell; PCP: Duncan Dull (Adults only); Best Callback Phone Number: [phone].  Patient calling about sinus pressure/earache; has had 3 ear infections in past 6 months.  Seen by Dr. Cheri Rous and everything looked good, but onset of new head congestion 2 weeks ago.  States has a cough, and sinus pain/pressure.  Afebrile.  Per URI protocol, emergent symptoms denied; advised appt within 24 hours.  Appt scheduled 04/06/12 1130 with Dr. Darrick Huntsman.

## 2012-04-05 NOTE — Progress Notes (Signed)
HISTORY OF PRESENT ILLNESS  [name] is a 46 y.o. year old female,G2P0, who presents for a problem visit. The patient had a Pap smear that showed ASCUS cells and HPV.  Colposcopy and biopsies were performed that shows CIN-1 and HPV.  Subjective:  The patient reports that she is more calm today.  Objective:  BP 136/80  Ht 5\' 7"  (1.702 m)  Wt 171 lb (77.565 kg)  BMI 26.78 kg/m2  LMP 03/14/2012   General: no distress  Exam deferred.  Assessment:  CIN-1  HPV  Anxious personality  Plan:  HPV and CIN discussed.  25 min. Visit (greater than 50% face to face) with patient and her husband.  They agree to observe only for now.  Gardasil recommended for their children. Repeat Pap smear in 6 months.  Return to office in 3 month(s) for annual exam.   Leonard Schwartz M.D.  04/05/2012 2:03 PM

## 2012-04-06 ENCOUNTER — Encounter: Payer: Self-pay | Admitting: Internal Medicine

## 2012-04-06 ENCOUNTER — Ambulatory Visit (INDEPENDENT_AMBULATORY_CARE_PROVIDER_SITE_OTHER): Payer: BC Managed Care – PPO | Admitting: Internal Medicine

## 2012-04-06 VITALS — BP 116/68 | HR 82 | Temp 98.4°F | Ht 66.5 in | Wt 171.5 lb

## 2012-04-06 DIAGNOSIS — Z23 Encounter for immunization: Secondary | ICD-10-CM

## 2012-04-06 DIAGNOSIS — J069 Acute upper respiratory infection, unspecified: Secondary | ICD-10-CM

## 2012-04-06 MED ORDER — LEVOFLOXACIN 500 MG PO TABS: 500.0000 mg | ORAL_TABLET | Freq: Every day | ORAL | Status: DC

## 2012-04-06 NOTE — Patient Instructions (Addendum)
You have a viral  Syndrome .  The post nasal drip is causing your cough  Lavage your sinuses twice daily with Simply saline nasal spray.  Use benadryl 25 mg every 8 hours and Sudafed PE 10 to 30 every 8 hours to manage the  post nasal drip  and congestion.  Continye the steroid spray from Dr Willeen Cass (use AFTER the saline)   If you develop T > 100.4,  Green nasal discharge,  Or ear or facial pain,  You may start the antibiotic.

## 2012-04-06 NOTE — Progress Notes (Signed)
Patient ID: Dawn Russell, female   DOB: 07-Oct-1965, 46 y.o.   MRN: [id number]  Patient Active Problem List  Diagnosis  . History of renal calculi  . Hypertension  . Screening for breast cancer  . Screening for cervical cancer  . Left maxillary sinusitis  . Otitis media  . Overweight (BMI 25.0-29.9)  . HPV in female  . ASCUS on Pap smear  . URI (upper respiratory infection)    Subjective:  CC:   Chief Complaint  Patient presents with  . Sinus Problem    HPI:   Dawn Russell a 46 y.o. female who presents with  URI symptoms for the past two weeks.  Symptoms started with mild left ear fullness with popping and crackling sounds with swallowing. Last week developed sinus congestion,  Treated with sudafed PE with some improvement.  No headaches, fevers. Purulent nasal drainage or discolored sputum..  Occasional cough.  No pharyngitis.    Past Medical History  Diagnosis Date  . History of renal calculi July 2012    s/p lithotripsy Dahlstedt  . Hypertension   . 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 Abnormal Pap smear     ASCUS  . Pelvic pain in female   . Dyspareunia   . Pelvic relaxation   . Kidney stones   . Vaginitis   . GERD (gastroesophageal reflux disease)   . Fibrocystic breast changes     Past Surgical History  Procedure Date  . Oophorectomy 2004    due to large benign cyst,  elevated CA125  . Cesarean section   . Cholecystectomy          The following portions of the patient's history were reviewed and updated as appropriate: Allergies, current medications, and problem list.    Review of Systems:   12 Pt  review of systems was negative except those addressed in the HPI,     History   Social History  . Marital Status: Married    Spouse Name: N/A    Number of Children: N/A  . Years of Education: N/A   Occupational History  . Not on file.   Social History Main Topics  . Smoking status: Never  Smoker   . Smokeless tobacco: Never Used  . Alcohol Use: No  . Drug Use: No  . Sexually Active: Yes    Birth Control/ Protection: Surgical     vas.   Other Topics Concern  . Not on file   Social History Narrative  . No narrative on file    Objective:  BP 116/68  Pulse 82  Temp 98.4 F (36.9 C) (Oral)  Ht 5' 6.5" (1.689 m)  Wt 171 lb 8 oz (77.792 kg)  BMI 27.27 kg/m2  SpO2 98%  LMP 04/05/2012  General appearance: alert, cooperative and appears stated age Ears: normal TM's and external ear canals both ears Throat: lips, mucosa, and tongue normal; teeth and gums normal Neck: no adenopathy, no carotid bruit, supple, symmetrical, trachea midline and thyroid not enlarged, symmetric, no tenderness/mass/nodules Back: symmetric, no curvature. ROM normal. No CVA tenderness. Lungs: clear to auscultation bilaterally Heart: regular rate and rhythm, S1, S2 normal, no murmur, click, rub or gallop Abdomen: soft, non-tender; bowel sounds normal; no masses,  no organomegaly Pulses: 2+ and symmetric Skin: Skin color, texture, turgor normal. No rashes or lesions Lymph nodes: Cervical, supraclavicular, and axillary nodes normal.  Assessment and Plan:  URI (upper respiratory infection) This URI is  currently mild and likely viral given the history  And exam .   I have explained that in viral URIS, an antibiotic will not help the symptoms and will increase the risk of developing diarrhea.,  Continue oral and nasal decongestants,  Use Ibuprofen 400 mg and tylenol 650 mq 8 hrs for aches and pains,  Add abx only if symptoms worsen to include fevers, facial pain, purulent sputum./drainage.    Updated Medication List Outpatient Encounter Prescriptions as of 04/06/2012  Medication Sig Dispense Refill  . losartan (COZAAR) 100 MG tablet Take 1 tablet (100 mg total) by mouth daily.  90 tablet  3  . triamcinolone (NASACORT) 55 MCG/ACT nasal inhaler Place 2 sprays into the nose daily.      . VENTOLIN  HFA 108 (90 BASE) MCG/ACT inhaler INHALE TWO PUFFS 4 TIMES DAILY AS NEEDED FOR  WHEEZING  18 g  PRN  . cetirizine (ZYRTEC) 10 MG tablet Take 10 mg by mouth daily.      . fluconazole (DIFLUCAN) 150 MG tablet Take 1 tablet (150 mg total) by mouth daily.  1 tablet  1  . fluticasone (FLONASE) 50 MCG/ACT nasal spray Place 2 sprays into the nose daily.      Marland Kitchen levofloxacin (LEVAQUIN) 500 MG tablet Take 1 tablet (500 mg total) by mouth daily.  7 tablet  0  . nystatin-triamcinolone ointment (MYCOLOG) apply to affected area bid x 7-14 days  30 g  0     Orders Placed This Encounter  Procedures  . Flu vaccine greater than or equal to 3yo preservative free IM    No Follow-up on file.

## 2012-04-08 ENCOUNTER — Encounter: Payer: Self-pay | Admitting: Internal Medicine

## 2012-04-08 DIAGNOSIS — J069 Acute upper respiratory infection, unspecified: Secondary | ICD-10-CM | POA: Insufficient documentation

## 2012-04-08 NOTE — Assessment & Plan Note (Signed)
This URI is currently mild and likely viral given the history  And exam .   I have explained that in viral URIS, an antibiotic will not help the symptoms and will increase the risk of developing diarrhea.,  Continue oral and nasal decongestants,  Use Ibuprofen 400 mg and tylenol 650 mq 8 hrs for aches and pains,  Add abx only if symptoms worsen to include fevers, facial pain, purulent sputum./drainage.

## 2012-10-25 ENCOUNTER — Other Ambulatory Visit: Payer: Self-pay | Admitting: Internal Medicine

## 2012-11-29 ENCOUNTER — Ambulatory Visit (INDEPENDENT_AMBULATORY_CARE_PROVIDER_SITE_OTHER): Payer: BC Managed Care – PPO | Admitting: Internal Medicine

## 2012-11-29 ENCOUNTER — Encounter: Payer: Self-pay | Admitting: Internal Medicine

## 2012-11-29 VITALS — BP 132/84 | HR 91 | Temp 98.6°F | Resp 16 | Wt 170.2 lb

## 2012-11-29 DIAGNOSIS — R5383 Other fatigue: Secondary | ICD-10-CM

## 2012-11-29 DIAGNOSIS — M545 Low back pain, unspecified: Secondary | ICD-10-CM

## 2012-11-29 DIAGNOSIS — R748 Abnormal levels of other serum enzymes: Secondary | ICD-10-CM

## 2012-11-29 DIAGNOSIS — E785 Hyperlipidemia, unspecified: Secondary | ICD-10-CM

## 2012-11-29 DIAGNOSIS — R5381 Other malaise: Secondary | ICD-10-CM

## 2012-11-29 DIAGNOSIS — Z79899 Other long term (current) drug therapy: Secondary | ICD-10-CM

## 2012-11-29 MED ORDER — MELOXICAM 15 MG PO TABS: 15.0000 mg | ORAL_TABLET | Freq: Every day | ORAL | Status: DC

## 2012-11-29 MED ORDER — TRAMADOL HCL 50 MG PO TABS: 50.0000 mg | ORAL_TABLET | Freq: Three times a day (TID) | ORAL | Status: DC | PRN

## 2012-11-29 MED ORDER — LOSARTAN POTASSIUM 100 MG PO TABS: ORAL_TABLET | ORAL | Status: DC

## 2012-11-29 NOTE — Progress Notes (Signed)
Patient ID: Dawn Russell, female   DOB: 07/16/1965, 47 y.o.   MRN: [id number]  Patient Active Problem List   Diagnosis Date Noted  . Elevated liver enzymes 11/30/2012  . Low back pain without sciatica 11/30/2012  . URI (upper respiratory infection) 04/08/2012  . 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 ASCUS on Pap smear 03/22/2012  . Otitis media 12/01/2011  . Overweight (BMI 25.0-29.9) 12/01/2011  . Left maxillary sinusitis 11/10/2011  . Screening for breast cancer 03/30/2011  . Screening for cervical cancer 03/30/2011  . History of renal calculi   . Hypertension     Subjective:  CC:   Chief Complaint  Patient presents with  . Acute Visit    Back X 3 weeks    HPI:   Dawn Russell a 47 y.o. female who presents with a 4 week history of persistent back pain unrelieved with 600 mg of ibuprofen every 8 hours and chiropractic manipulation.  She has tried  yoga and stretching . Marland Kitchen  Does a lot of heavy lifting every day at work,  Not history of a fall or a specific acitivty which aggravated pain .  She has not taken any time off from work .  The pain is nonradiating.  No leg weakness or saddle parasthesias.      Past Medical History  Diagnosis Date  . History of renal calculi July 2012    s/p lithotripsy Dawn Russell  . Hypertension   . 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 Abnormal Pap smear     ASCUS  . Pelvic pain in female   . Dyspareunia   . Pelvic relaxation   . Kidney stones   . Vaginitis   . GERD (gastroesophageal reflux disease)   . Fibrocystic breast changes     Past Surgical History  Procedure Laterality Date  . Oophorectomy  2004    due to large benign cyst,  elevated CA125  . Cesarean section    . Cholecystectomy         The following portions of the patient's history were reviewed and updated as appropriate: Allergies, current medications, and problem list.    Review of Systems:   Patient denies headache, fevers,  malaise, unintentional weight loss, skin rash, eye pain, sinus congestion and sinus pain, sore throat, dysphagia,  hemoptysis , cough, dyspnea, wheezing, chest pain, palpitations, orthopnea, edema, abdominal pain, nausea, melena, diarrhea, constipation, flank pain, dysuria, hematuria, urinary  Frequency, nocturia, numbness, tingling, seizures,  Focal weakness, Loss of consciousness,  Tremor, insomnia, depression, anxiety, and suicidal ideation.      History   Social History  . Marital Status: Married    Spouse Name: N/A    Number of Children: N/A  . Years of Education: N/A   Occupational History  . Not on file.   Social History Main Topics  . Smoking status: Never Smoker   . Smokeless tobacco: Never Used  . Alcohol Use: No  . Drug Use: No  . Sexually Active: Yes    Birth Control/ Protection: Surgical     Comment: vas.   Other Topics Concern  . Not on file   Social History Narrative  . No narrative on file    Objective:  BP 132/84  Pulse 91  Temp(Src) 98.6 F (37 C) (Oral)  Resp 16  Wt 170 lb 4 oz (77.225 kg)  BMI 27.07 kg/m2  SpO2 98%  LMP 11/13/2012  General appearance: alert, cooperative and appears  stated age Ears: normal TM's and external ear canals both ears Throat: lips, mucosa, and tongue normal; teeth and gums normal Neck: no adenopathy, no carotid bruit, supple, symmetrical, trachea midline and thyroid not enlarged, symmetric, no tenderness/mass/nodules Back: symmetric, no curvature. ROM normal. No CVA tenderness. Lungs: clear to auscultation bilaterally Heart: regular rate and rhythm, S1, S2 normal, no murmur, click, rub or gallop Abdomen: soft, non-tender; bowel sounds normal; no masses,  no organomegaly Pulses: 2+ and symmetric Skin: Skin color, texture, turgor normal. No rashes or lesions Lymph nodes: Cervical, supraclavicular, and axillary nodes normal. Neuro: DTRs normal,  Strength in both LE's normal.   Assessment and Plan:  Low back pain  without sciatica Neurologic exam is normal. Meloxicam, tramadol and tylenol.Marland Kitchen    Updated Medication List Outpatient Encounter Prescriptions as of 11/29/2012  Medication Sig Dispense Refill  . losartan (COZAAR) 100 MG tablet TAKE 1 TABLET DAILY  90 tablet  3  . VENTOLIN HFA 108 (90 BASE) MCG/ACT inhaler INHALE TWO PUFFS 4 TIMES DAILY AS NEEDED FOR  WHEEZING  18 g  PRN  . [DISCONTINUED] losartan (COZAAR) 100 MG tablet TAKE 1 TABLET DAILY  30 tablet  0  . cetirizine (ZYRTEC) 10 MG tablet Take 10 mg by mouth daily.      . fluconazole (DIFLUCAN) 150 MG tablet Take 1 tablet (150 mg total) by mouth daily.  1 tablet  1  . fluticasone (FLONASE) 50 MCG/ACT nasal spray Place 2 sprays into the nose daily.      Marland Kitchen levofloxacin (LEVAQUIN) 500 MG tablet Take 1 tablet (500 mg total) by mouth daily.  7 tablet  0  . meloxicam (MOBIC) 15 MG tablet Take 1 tablet (15 mg total) by mouth daily.  30 tablet  0  . nystatin-triamcinolone ointment (MYCOLOG) apply to affected area bid x 7-14 days  30 g  0  . traMADol (ULTRAM) 50 MG tablet Take 1 tablet (50 mg total) by mouth every 8 (eight) hours as needed for pain.  90 tablet  1  . triamcinolone (NASACORT) 55 MCG/ACT nasal inhaler Place 2 sprays into the nose daily.       No facility-administered encounter medications on file as of 11/29/2012.     Orders Placed This Encounter  Procedures  . Comprehensive metabolic panel  . TSH  . Iron and TIBC  . Hepatitis C antibody  . ANA  . Ferritin  . Anti-Smith antibody  . Hepatitis B surface antigen  . Hepatitis B core antibody, total  . Comprehensive metabolic panel  . Lipid panel    No Follow-up on file.

## 2012-11-29 NOTE — Patient Instructions (Addendum)
We are choosing meloxicam as your anti inflammatory because it is only once daily   Tramadol can be taken every 8 hours if needed. Or just at bedtime up to 100 mg per dose  Ok to add tylenol if needed.   I will refill losartan x 90 days once  I see your labs today

## 2012-11-30 ENCOUNTER — Encounter: Payer: Self-pay | Admitting: Internal Medicine

## 2012-11-30 DIAGNOSIS — M545 Low back pain, unspecified: Secondary | ICD-10-CM | POA: Insufficient documentation

## 2012-11-30 DIAGNOSIS — R748 Abnormal levels of other serum enzymes: Secondary | ICD-10-CM | POA: Insufficient documentation

## 2012-11-30 LAB — COMPREHENSIVE METABOLIC PANEL
ALT: 49 U/L — ABNORMAL HIGH (ref 0–35)
Alkaline Phosphatase: 70 U/L (ref 39–117)
Sodium: 137 mEq/L (ref 135–145)
Total Bilirubin: 0.4 mg/dL (ref 0.3–1.2)
Total Protein: 7.6 g/dL (ref 6.0–8.3)

## 2012-11-30 NOTE — Assessment & Plan Note (Signed)
Neurologic exam is normal. Meloxicam, tramadol and tylenol.Marland Kitchen

## 2012-12-14 ENCOUNTER — Other Ambulatory Visit (INDEPENDENT_AMBULATORY_CARE_PROVIDER_SITE_OTHER): Payer: BC Managed Care – PPO

## 2012-12-14 ENCOUNTER — Other Ambulatory Visit: Payer: Self-pay | Admitting: *Deleted

## 2012-12-14 DIAGNOSIS — R5383 Other fatigue: Secondary | ICD-10-CM

## 2012-12-14 DIAGNOSIS — E785 Hyperlipidemia, unspecified: Secondary | ICD-10-CM

## 2012-12-14 DIAGNOSIS — R5381 Other malaise: Secondary | ICD-10-CM

## 2012-12-14 DIAGNOSIS — R748 Abnormal levels of other serum enzymes: Secondary | ICD-10-CM

## 2012-12-14 LAB — COMPREHENSIVE METABOLIC PANEL
AST: 18 U/L (ref 0–37)
BUN: 12 mg/dL (ref 6–23)
Calcium: 9.3 mg/dL (ref 8.4–10.5)
Chloride: 106 mEq/L (ref 96–112)
Creatinine, Ser: 0.6 mg/dL (ref 0.4–1.2)
Glucose, Bld: 109 mg/dL — ABNORMAL HIGH (ref 70–99)

## 2012-12-14 LAB — LIPID PANEL
Cholesterol: 203 mg/dL — ABNORMAL HIGH (ref 0–200)
HDL: 55.4 mg/dL (ref >39.00)
Triglycerides: 108 mg/dL (ref 0.0–149.0)

## 2012-12-14 LAB — IRON AND TIBC: UIBC: 163 ug/dL (ref 125–400)

## 2012-12-14 LAB — LDL CHOLESTEROL, DIRECT: Direct LDL: 141.8 mg/dL

## 2012-12-14 MED ORDER — LOSARTAN POTASSIUM 100 MG PO TABS: ORAL_TABLET | ORAL | Status: DC

## 2012-12-15 LAB — ANA: Anti Nuclear Antibody(ANA): NEGATIVE

## 2012-12-15 LAB — HEPATITIS B SURFACE ANTIGEN: Hepatitis B Surface Ag: NEGATIVE

## 2012-12-15 LAB — HEPATITIS C ANTIBODY: HCV Ab: NEGATIVE

## 2012-12-19 LAB — ANTI-SMITH ANTIBODY: ENA SM Ab Ser-aCnc: <1 AI (ref <1.0)

## 2012-12-19 NOTE — Addendum Note (Signed)
Addended by: Sherlene Shams on: 12/19/2012 08:55 PM   Modules accepted: Orders

## 2012-12-22 ENCOUNTER — Telehealth: Payer: Self-pay | Admitting: Internal Medicine

## 2012-12-22 ENCOUNTER — Ambulatory Visit: Payer: Self-pay | Admitting: Internal Medicine

## 2012-12-22 DIAGNOSIS — R748 Abnormal levels of other serum enzymes: Secondary | ICD-10-CM

## 2012-12-22 NOTE — Telephone Encounter (Signed)
Liver ultrasound was normal .  Repeat liver tests normal and all serologies for hepatitis causes were negative

## 2012-12-22 NOTE — Assessment & Plan Note (Signed)
Liver ultrasound was normal and serologies for hepatitis causes were negative

## 2012-12-23 NOTE — Telephone Encounter (Signed)
Called and advised patient of results

## 2013-01-12 ENCOUNTER — Encounter: Payer: Self-pay | Admitting: Internal Medicine

## 2013-04-21 ENCOUNTER — Ambulatory Visit: Payer: BC Managed Care – PPO

## 2013-05-02 ENCOUNTER — Ambulatory Visit (INDEPENDENT_AMBULATORY_CARE_PROVIDER_SITE_OTHER): Payer: BC Managed Care – PPO

## 2013-05-02 DIAGNOSIS — Z23 Encounter for immunization: Secondary | ICD-10-CM

## 2013-05-20 ENCOUNTER — Other Ambulatory Visit: Payer: Self-pay | Admitting: Internal Medicine

## 2013-06-20 ENCOUNTER — Telehealth: Payer: Self-pay | Admitting: Internal Medicine

## 2013-06-20 ENCOUNTER — Ambulatory Visit: Payer: Self-pay | Admitting: Family Medicine

## 2013-06-20 NOTE — Telephone Encounter (Signed)
FYI being seen at [facility]

## 2013-06-20 NOTE — Telephone Encounter (Signed)
Patient Information:  Caller Name: Ieisha  Phone: [phone]  Patient: Dawn Russell, Dawn Russell  Gender: Female  DOB: 08/09/1965  Age: 48 Years  PCP: Deborra Medina (Adults only)  Pregnant: No  Office Follow Up:  Does the office need to follow up with this patient?: No  Instructions For The Office: N/A   Symptoms  Reason For Call & Symptoms: Pressure over bladder area, urgency, frequency, small amounts of urine at times.  Reviewed Health History In EMR: Yes  Reviewed Medications In EMR: Yes  Reviewed Allergies In EMR: Yes  Reviewed Surgeries / Procedures: Yes  Date of Onset of Symptoms: 06/18/2013 OB / GYN:  LMP: 06/07/2013  Guideline(s) Used:  Urination Pain - Female  Disposition Per Guideline:   See Today in Office  Reason For Disposition Reached:   All other females with painful urination, or patient wants to be seen  Advice Given:  N/A  Patient Will Follow Care Advice:  YES  Appointment Scheduled:  06/20/2013 15:45:00 Appointment Scheduled Provider:  Hulan Saas, DO with [facility]

## 2013-07-27 ENCOUNTER — Ambulatory Visit (INDEPENDENT_AMBULATORY_CARE_PROVIDER_SITE_OTHER): Payer: BC Managed Care – PPO | Admitting: Internal Medicine

## 2013-07-27 ENCOUNTER — Encounter: Payer: Self-pay | Admitting: Internal Medicine

## 2013-07-27 VITALS — BP 118/74 | HR 120 | Temp 99.6°F | Resp 18 | Wt 163.5 lb

## 2013-07-27 DIAGNOSIS — R6889 Other general symptoms and signs: Secondary | ICD-10-CM

## 2013-07-27 DIAGNOSIS — J111 Influenza due to unidentified influenza virus with other respiratory manifestations: Secondary | ICD-10-CM

## 2013-07-27 LAB — POCT INFLUENZA A/B
Influenza A, POC: POSITIVE
Influenza B, POC: POSITIVE

## 2013-07-27 MED ORDER — PSEUDOEPHEDRINE-CODEINE-GG 30-10-100 MG/5ML PO SOLN: 10.0000 mL | Freq: Four times a day (QID) | ORAL | Status: DC | PRN

## 2013-07-27 MED ORDER — PROMETHAZINE HCL 25 MG PO TABS: 25.0000 mg | ORAL_TABLET | Freq: Three times a day (TID) | ORAL | Status: DC | PRN

## 2013-07-27 MED ORDER — LEVOFLOXACIN 500 MG PO TABS: 500.0000 mg | ORAL_TABLET | Freq: Every day | ORAL | Status: DC

## 2013-07-27 MED ORDER — OSELTAMIVIR PHOSPHATE 75 MG PO CAPS: 75.0000 mg | ORAL_CAPSULE | Freq: Two times a day (BID) | ORAL | Status: DC

## 2013-07-27 NOTE — Progress Notes (Signed)
Pre-visit discussion using our clinic review tool. No additional management support is needed unless otherwise documented below in the visit note.  

## 2013-07-27 NOTE — Patient Instructions (Addendum)
You have the flu.  Please ask you husband to request a "prophylactic" dose of Tamiflu from his physician.  Start your Tamiflu ASAP ,  Twice daily for 5 days.  This is the "anti viral" medication   I called in Promethazine to take  if needed for nausea..  It will make you sleepy  Cheratussin cough syrup (has sudafed codeine and mucinex already in it)  You Can use benadryl for runny nose if needed; Ibuprofen and tylenol as needed for fevers and body aches.  If the throat is no better  In 3 to 4 days OR  if you develop  Facial pain with blood streaked or Green nasal discharge, start the levaquin. Please take a probiotic ( Align, Floraque or Culturelle) for 2 weeks if you start the Levaquin to prevent a serious antibiotic associated diarrhea  Called" clostridium dificile colitis" ( should also help prevent   vaginal yeast infection) .

## 2013-07-27 NOTE — Progress Notes (Signed)
Patient ID: Dawn Russell, female   DOB: 1966/02/06, 48 y.o.   MRN: [id number]   Patient Active Problem List   Diagnosis Date Noted  . Influenza with respiratory manifestations 07/29/2013  . Elevated liver enzymes 11/30/2012  . Low back pain without sciatica 11/30/2012  . URI (upper respiratory infection) 04/08/2012  . 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 ASCUS on Pap smear 03/22/2012  . Otitis media 12/01/2011  . Overweight (BMI 25.0-29.9) 12/01/2011  . Left maxillary sinusitis 11/10/2011  . Screening for breast cancer 03/30/2011  . Screening for cervical cancer 03/30/2011  . History of renal calculi   . Hypertension     Subjective:  CC:   Chief Complaint  Patient presents with  . Cough    joint pain  fever reported by patient of 101.  . Sore Throat    HPI:   Dawn Russell is a 48 y.o. female who presents for evaluation of flu like symptoms .  She returned from flight to [place] less than 48 hours ago and developed cough, myalgias,  And fevers 24 hours ago. Family is not sick but husband has now been exposed to her,    Past Medical History  Diagnosis Date  . History of renal calculi July 2012    s/p lithotripsy Dahlstedt  . Hypertension   . 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 Abnormal Pap smear     ASCUS  . Pelvic pain in female   . Dyspareunia   . Pelvic relaxation   . Kidney stones   . Vaginitis   . GERD (gastroesophageal reflux disease)   . Fibrocystic breast changes     Past Surgical History  Procedure Laterality Date  . Oophorectomy  2004    due to large benign cyst,  elevated CA125  . Cesarean section    . Cholecystectomy         The following portions of the patient's history were reviewed and updated as appropriate: Allergies, current medications, and problem list.    Review of Systems:   Patient denies headache, fevers, malaise, unintentional weight loss, skin rash, eye pain, sinus congestion and sinus pain,  sore throat, dysphagia,  hemoptysis , cough, dyspnea, wheezing, chest pain, palpitations, orthopnea, edema, abdominal pain, nausea, melena, diarrhea, constipation, flank pain, dysuria, hematuria, urinary  Frequency, nocturia, numbness, tingling, seizures,  Focal weakness, Loss of consciousness,  Tremor, insomnia, depression, anxiety, and suicidal ideation.     History   Social History  . Marital Status: Married    Spouse Name: N/A    Number of Children: N/A  . Years of Education: N/A   Occupational History  . Not on file.   Social History Main Topics  . Smoking status: Never Smoker   . Smokeless tobacco: Never Used  . Alcohol Use: No  . Drug Use: No  . Sexual Activity: Yes    Birth Control/ Protection: Surgical     Comment: vas.   Other Topics Concern  . Not on file   Social History Narrative  . No narrative on file    Objective:  Filed Vitals:   07/27/13 0930  BP: 118/74  Pulse: 120  Temp: 99.6 F (37.6 C)  Resp: 18     General appearance: alert, cooperative and appears stated age.  Sick appearing  Ears: normal TM's and external ear canals both ears Throat: lips, mucosa, and tongue normal; teeth and gums normal Neck: cervical adenopathy noted , no carotid bruit,  supple, symmetrical, trachea midline and thyroid not enlarged, symmetric, no tenderness/mass/nodules Back: symmetric, no curvature. ROM normal. No CVA tenderness. Lungs: clear to auscultation bilaterally Heart: regular rate and rhythm, S1, S2 normal, no murmur, click, rub or gallop Abdomen: soft, non-tender; bowel sounds normal; no masses,  no organomegaly Pulses: 2+ and symmetric Skin: Skin color, texture, turgor normal. No rashes or lesions Lymph nodes: Cervical, supraclavicular, and axillary nodes normal.  Assessment and Plan:  Influenza with respiratory manifestations tamiflu prescribed.  Other supportive care outlined.  Work note given  Prophylaxis recommended for husband.    Updated  Medication List Outpatient Encounter Prescriptions as of 07/27/2013  Medication Sig  . losartan (COZAAR) 100 MG tablet TAKE 1 TABLET DAILY  . VENTOLIN HFA 108 (90 BASE) MCG/ACT inhaler INHALE TWO PUFFS 4 TIMES DAILY AS NEEDED FOR  WHEEZING  . cetirizine (ZYRTEC) 10 MG tablet Take 10 mg by mouth daily.  . fluconazole (DIFLUCAN) 150 MG tablet Take 1 tablet (150 mg total) by mouth daily.  . fluticasone (FLONASE) 50 MCG/ACT nasal spray Place 2 sprays into the nose daily.  Marland Kitchen levofloxacin (LEVAQUIN) 500 MG tablet Take 1 tablet (500 mg total) by mouth daily.  Marland Kitchen levofloxacin (LEVAQUIN) 500 MG tablet Take 1 tablet (500 mg total) by mouth daily.  . meloxicam (MOBIC) 15 MG tablet Take 1 tablet (15 mg total) by mouth daily.  Marland Kitchen nystatin-triamcinolone ointment (MYCOLOG) apply to affected area bid x 7-14 days  . oseltamivir (TAMIFLU) 75 MG capsule Take 1 capsule (75 mg total) by mouth 2 (two) times daily.  . promethazine (PHENERGAN) 25 MG tablet Take 1 tablet (25 mg total) by mouth every 8 (eight) hours as needed for nausea or vomiting.  . pseudoephedrine-codeine-guaifenesin (CHERATUSSIN DAC) 30-10-100 MG/5ML solution Take 10 mLs by mouth 4 (four) times daily as needed for cough.  . traMADol (ULTRAM) 50 MG tablet Take 1 tablet (50 mg total) by mouth every 8 (eight) hours as needed for pain.  Marland Kitchen triamcinolone (NASACORT) 55 MCG/ACT nasal inhaler Place 2 sprays into the nose daily.     Orders Placed This Encounter  Procedures  . POCT Influenza A/B    No Follow-up on file.

## 2013-07-29 DIAGNOSIS — J111 Influenza due to unidentified influenza virus with other respiratory manifestations: Secondary | ICD-10-CM | POA: Insufficient documentation

## 2013-07-29 NOTE — Assessment & Plan Note (Signed)
tamiflu prescribed.  Other supportive care outlined.  Work note given  Prophylaxis recommended for husband.

## 2013-10-24 ENCOUNTER — Other Ambulatory Visit: Payer: Self-pay | Admitting: Internal Medicine

## 2013-10-25 ENCOUNTER — Other Ambulatory Visit: Payer: Self-pay | Admitting: Obstetrics and Gynecology

## 2013-10-25 DIAGNOSIS — Z1231 Encounter for screening mammogram for malignant neoplasm of breast: Secondary | ICD-10-CM

## 2013-11-07 ENCOUNTER — Ambulatory Visit (HOSPITAL_COMMUNITY)
Admission: RE | Admit: 2013-11-07 | Discharge: 2013-11-07 | Disposition: A | Discharge status: Home / Self Care | Payer: BC Managed Care – PPO | Source: Ambulatory Visit | Attending: Obstetrics and Gynecology | Admitting: Obstetrics and Gynecology

## 2013-11-07 DIAGNOSIS — Z1231 Encounter for screening mammogram for malignant neoplasm of breast: Secondary | ICD-10-CM

## 2013-12-05 ENCOUNTER — Ambulatory Visit (INDEPENDENT_AMBULATORY_CARE_PROVIDER_SITE_OTHER): Payer: BC Managed Care – PPO | Admitting: Adult Health

## 2013-12-05 ENCOUNTER — Encounter: Payer: Self-pay | Admitting: Adult Health

## 2013-12-05 VITALS — BP 122/60 | HR 95 | Temp 98.8°F | Resp 14 | Wt 163.0 lb

## 2013-12-05 DIAGNOSIS — J019 Acute sinusitis, unspecified: Secondary | ICD-10-CM

## 2013-12-05 MED ORDER — AZITHROMYCIN 250 MG PO TABS: ORAL_TABLET | ORAL | Status: DC

## 2013-12-05 NOTE — Progress Notes (Signed)
Pre visit review using our clinic review tool, if applicable. No additional management support is needed unless otherwise documented below in the visit note. 

## 2013-12-05 NOTE — Patient Instructions (Addendum)
  Start azithromycin 2 tablets today then one tablet daily for the next 4 days.  Continue Mucinex and Zyrtec as you have been doing.  You may take Sudafed for a few days. This should not affect your blood pressure if used short-term.  You can used Afrin nasal spray for 3 days only if your get severely congested.   Drink plenty of fluids to stay hydrated.  Tylenol for general body aches, discomfort or fever.

## 2013-12-05 NOTE — Progress Notes (Signed)
   Subjective:    Patient ID: Dawn Russell, female    DOB: 11-21-1965, 48 y.o.   MRN: [id number]  Sinusitis Associated symptoms include congestion, coughing and sinus pressure.    Pleasant 48 yo caucasian female presents today for a potential sinus infection. Symptoms started approximately 1 1/2 weeks ago. Indicates fogginess feeling in the head and some congestion in her chest. Productive cough especially worse in the morning is thick, white and some yellowish. Feels malaise and not like doing anything. Some sinus pressure. Denies sore throat, fever, or hearing, or balance changes. Has been taking mucinex and zyrtec with little relief. Did not take sudafed because of concern with her blood pressure.  Has a job interview coming up and would like to get this cleared up before.   Past Medical History  Diagnosis Date  . History of renal calculi July 2012    s/p lithotripsy Dahlstedt  . Hypertension   . 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 Abnormal Pap smear     ASCUS  . Pelvic pain in female   . Dyspareunia   . Pelvic relaxation   . Kidney stones   . Vaginitis   . GERD (gastroesophageal reflux disease)   . Fibrocystic breast changes     Current Outpatient Prescriptions on File Prior to Visit  Medication Sig Dispense Refill  . cetirizine (ZYRTEC) 10 MG tablet Take 10 mg by mouth daily.      . fluticasone (FLONASE) 50 MCG/ACT nasal spray Place 2 sprays into the nose daily.      Marland Kitchen losartan (COZAAR) 100 MG tablet TAKE 1 TABLET DAILY  30 tablet  0  . VENTOLIN HFA 108 (90 BASE) MCG/ACT inhaler INHALE TWO PUFFS 4 TIMES DAILY AS NEEDED FOR  WHEEZING  18 g  PRN   No current facility-administered medications on file prior to visit.     Review of Systems  HENT: Positive for congestion and sinus pressure.   Respiratory: Positive for cough.   All other systems reviewed and are negative.      Objective:  BP 122/60  Pulse 95  Temp(Src) 98.8  F (37.1 C) (Oral)  Resp 14  Wt 163 lb (73.936 kg)  SpO2 97%  LMP 10/08/2013   Physical Exam  Constitutional: She is oriented to person, place, and time. No distress.  HENT:  Right Ear: External ear normal.  Left Ear: External ear normal.  Nose: Nose normal.  Mucus membranes moist and pink. Mild errythemia in oral pharynx with no exudate.   Cardiovascular: Normal rate, regular rhythm and normal heart sounds.   Pulmonary/Chest: Effort normal and breath sounds normal.  Neurological: She is alert and oriented to person, place, and time.  Skin: Skin is warm and dry.  Psychiatric: She has a normal mood and affect. Her behavior is normal. Judgment and thought content normal.       Assessment & Plan:   .1. Acute sinusitis, unspecified Symptoms consistent with a bacterial sinus infection. Start azithromycin as below. Continue the mucinex and zyrtec as needed. May take Sudafed for a couple of days. Use saline nasal and drink plenty of fluids. Use Afrin nasal spray for 3 days if you get severely congested.   - azithromycin (ZITHROMAX) 250 MG tablet; Start 2 tablets today then 1 tablet daily for 4 days.  Dispense: 6 tablet; Refill: 0

## 2014-01-09 ENCOUNTER — Telehealth: Payer: Self-pay | Admitting: Internal Medicine

## 2014-01-09 DIAGNOSIS — R5383 Other fatigue: Secondary | ICD-10-CM

## 2014-01-09 DIAGNOSIS — E785 Hyperlipidemia, unspecified: Secondary | ICD-10-CM

## 2014-01-09 DIAGNOSIS — R5381 Other malaise: Secondary | ICD-10-CM

## 2014-01-09 DIAGNOSIS — I10 Essential (primary) hypertension: Secondary | ICD-10-CM

## 2014-01-09 NOTE — Telephone Encounter (Signed)
Patient called for refill on losartan requesting a 90 day supply patient was advised in May needed follow up appointment but has not scheduled please advise.

## 2014-01-10 MED ORDER — LOSARTAN POTASSIUM 100 MG PO TABS: ORAL_TABLET | ORAL | Status: DC

## 2014-01-10 NOTE — Telephone Encounter (Signed)
Patient notified to call office and schedule fasting lab appointment.

## 2014-01-10 NOTE — Telephone Encounter (Signed)
Refill one 30 days only on losartan.  Has to have labs done because it has been over a year . Fasting labs ordered

## 2014-01-18 ENCOUNTER — Other Ambulatory Visit: Payer: BC Managed Care – PPO

## 2014-01-23 ENCOUNTER — Other Ambulatory Visit (INDEPENDENT_AMBULATORY_CARE_PROVIDER_SITE_OTHER): Payer: BC Managed Care – PPO

## 2014-01-23 DIAGNOSIS — R5383 Other fatigue: Secondary | ICD-10-CM

## 2014-01-23 DIAGNOSIS — I10 Essential (primary) hypertension: Secondary | ICD-10-CM

## 2014-01-23 DIAGNOSIS — R5381 Other malaise: Secondary | ICD-10-CM

## 2014-01-23 DIAGNOSIS — E785 Hyperlipidemia, unspecified: Secondary | ICD-10-CM

## 2014-01-23 LAB — TSH: TSH: 1.23 u[IU]/mL (ref 0.35–4.50)

## 2014-01-23 LAB — COMPREHENSIVE METABOLIC PANEL
ALBUMIN: 3.8 g/dL (ref 3.5–5.2)
ALK PHOS: 61 U/L (ref 39–117)
ALT: 26 U/L (ref 0–35)
AST: 20 U/L (ref 0–37)
BUN: 10 mg/dL (ref 6–23)
CALCIUM: 9 mg/dL (ref 8.4–10.5)
CHLORIDE: 105 meq/L (ref 96–112)
CO2: 28 mEq/L (ref 19–32)
Creatinine, Ser: 0.6 mg/dL (ref 0.4–1.2)
GFR: 112.92 mL/min (ref >60.00)
GLUCOSE: 100 mg/dL — AB (ref 70–99)
Potassium: 3.8 mEq/L (ref 3.5–5.1)
Sodium: 139 mEq/L (ref 135–145)
Total Bilirubin: 0.5 mg/dL (ref 0.2–1.2)
Total Protein: 6.9 g/dL (ref 6.0–8.3)

## 2014-01-23 LAB — LIPID PANEL
CHOLESTEROL: 191 mg/dL (ref 0–200)
HDL: 63.4 mg/dL (ref >39.00)
LDL Cholesterol: 101 mg/dL — ABNORMAL HIGH (ref 0–99)
NonHDL: 127.6
TRIGLYCERIDES: 134 mg/dL (ref 0.0–149.0)
Total CHOL/HDL Ratio: 3
VLDL: 26.8 mg/dL (ref 0.0–40.0)

## 2014-01-24 ENCOUNTER — Encounter: Payer: Self-pay | Admitting: *Deleted

## 2014-02-05 ENCOUNTER — Telehealth: Payer: Self-pay | Admitting: Internal Medicine

## 2014-02-05 DIAGNOSIS — I10 Essential (primary) hypertension: Secondary | ICD-10-CM

## 2014-02-05 MED ORDER — LOSARTAN POTASSIUM 100 MG PO TABS: ORAL_TABLET | ORAL | Status: DC

## 2014-02-05 NOTE — Telephone Encounter (Signed)
Patient called for refill on losartan [facility] day supply [facility]

## 2014-04-09 ENCOUNTER — Encounter: Payer: Self-pay | Admitting: Adult Health

## 2014-05-17 ENCOUNTER — Ambulatory Visit (INDEPENDENT_AMBULATORY_CARE_PROVIDER_SITE_OTHER): Payer: BC Managed Care – PPO | Admitting: *Deleted

## 2014-05-17 ENCOUNTER — Ambulatory Visit: Payer: BC Managed Care – PPO

## 2014-05-17 DIAGNOSIS — Z23 Encounter for immunization: Secondary | ICD-10-CM

## 2014-06-24 ENCOUNTER — Emergency Department: Payer: Self-pay | Admitting: Physician Assistant

## 2014-06-24 DIAGNOSIS — S060X9A Concussion with loss of consciousness of unspecified duration, initial encounter: Secondary | ICD-10-CM

## 2014-06-24 DIAGNOSIS — S060XAA Concussion with loss of consciousness status unknown, initial encounter: Secondary | ICD-10-CM

## 2014-06-24 HISTORY — DX: Concussion with loss of consciousness of unspecified duration, initial encounter: S06.0X9A

## 2014-06-24 HISTORY — DX: Concussion with loss of consciousness status unknown, initial encounter: S06.0XAA

## 2014-06-25 ENCOUNTER — Encounter: Payer: Self-pay | Admitting: Internal Medicine

## 2014-06-25 ENCOUNTER — Telehealth: Payer: Self-pay | Admitting: Internal Medicine

## 2014-06-25 ENCOUNTER — Ambulatory Visit (INDEPENDENT_AMBULATORY_CARE_PROVIDER_SITE_OTHER): Payer: BLUE CROSS/BLUE SHIELD | Admitting: Internal Medicine

## 2014-06-25 VITALS — BP 138/76 | HR 92 | Temp 97.9°F | Resp 16 | Ht 66.5 in | Wt 166.5 lb

## 2014-06-25 DIAGNOSIS — S060X0D Concussion without loss of consciousness, subsequent encounter: Secondary | ICD-10-CM

## 2014-06-25 DIAGNOSIS — S060X0A Concussion without loss of consciousness, initial encounter: Secondary | ICD-10-CM

## 2014-06-25 MED ORDER — CEPHALEXIN 500 MG PO CAPS: 500.0000 mg | ORAL_CAPSULE | Freq: Three times a day (TID) | ORAL | Status: DC

## 2014-06-25 NOTE — Telephone Encounter (Signed)
Please schedule in place of the cancellation

## 2014-06-25 NOTE — Progress Notes (Signed)
Patient ID: Dawn Russell, female   DOB: 09-05-1965, 49 y.o.   MRN: [id number]  Patient Active Problem List   Diagnosis Date Noted  . Concussion with no loss of consciousness 06/28/2014  . Influenza with respiratory manifestations 07/29/2013  . Elevated liver enzymes 11/30/2012  . Low back pain without sciatica 11/30/2012  . URI (upper respiratory infection) 04/08/2012  . 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 ASCUS on Pap smear 03/22/2012  . Otitis media 12/01/2011  . Overweight (BMI 25.0-29.9) 12/01/2011  . Left maxillary sinusitis 11/10/2011  . Screening for breast cancer 03/30/2011  . Screening for cervical cancer 03/30/2011  . History of renal calculi   . Hypertension     Subjective:  CC:   Chief Complaint  Patient presents with  . Follow-up    For ER visit fell down 3 to 4 steps.    HPI:   Dawn Russell is a 49 y.o. female who presents for  TREATED IN ED  JAN 17 , 2 DAYS after a fall, for  PERSISTENT HEADACHE, feeling foggy BLURRED VISION.  Patient fell face forward while descending cement stairway at her daughter's high school basketball game on Jan 15th.   HIT FACE FIRST ON CEMENT.  Lacerated her upper lip, and abraded her right .  She was not noted to have a LOC and did not go to ER until 2 days later because she had a persistent headache accompanied by feeling mentally "foggy" and noting some blurred vision.     Head CT was normal in ER and she was released with recommendations for treatment of concussion and a 2 day "stay out of work" note.   Given rx for meclozine only, and given a tetanus shot. She has not complied with instructions for brain rest.  Has been watching TV, texting,  Using computer.  Still has a headache, mild and feels exhausted.   Past Medical History  Diagnosis Date  . History of renal calculi July 2012    s/p lithotripsy Dahlstedt  . Hypertension   . 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 Abnormal Pap smear    ASCUS  . Pelvic pain in female   . Dyspareunia   . Pelvic relaxation   . Kidney stones   . Vaginitis   . GERD (gastroesophageal reflux disease)   . Fibrocystic breast changes     Past Surgical History  Procedure Laterality Date  . Oophorectomy  2004    due to large benign cyst,  elevated CA125  . Cesarean section    . Cholecystectomy         The following portions of the patient's history were reviewed and updated as appropriate: Allergies, current medications, and problem list.    Review of Systems:   Patient denies headache, fevers, malaise, unintentional weight loss, skin rash, eye pain, sinus congestion and sinus pain, sore throat, dysphagia,  hemoptysis , cough, dyspnea, wheezing, chest pain, palpitations, orthopnea, edema, abdominal pain, nausea, melena, diarrhea, constipation, flank pain, dysuria, hematuria, urinary  Frequency, nocturia, numbness, tingling, seizures,  Focal weakness, Loss of consciousness,  Tremor, insomnia, depression, anxiety, and suicidal ideation.     History   Social History  . Marital Status: Married    Spouse Name: N/A    Number of Children: N/A  . Years of Education: N/A   Occupational History  . Not on file.   Social History Main Topics  . Smoking status: Never Smoker   . Smokeless  tobacco: Never Used  . Alcohol Use: No  . Drug Use: No  . Sexual Activity: Yes    Birth Control/ Protection: Surgical     Comment: vas.   Other Topics Concern  . Not on file   Social History Narrative    Objective:  Filed Vitals:   06/25/14 1649  BP: 138/76  Pulse: 92  Temp: 97.9 F (36.6 C)  Resp: 16     General appearance: alert, cooperative and appears stated age Ears: normal TM's and external ear canals both ears Throat: lips, mucosa, and tongue normal; teeth and gums normal Neck: no adenopathy, no carotid bruit, supple, symmetrical, trachea midline and thyroid not enlarged, symmetric, no tenderness/mass/nodules Back: symmetric, no  curvature. ROM normal. No CVA tenderness. Lungs: clear to auscultation bilaterally Heart: regular rate and rhythm, S1, S2 normal, no murmur, click, rub or gallop Abdomen: soft, non-tender; bowel sounds normal; no masses,  no organomegaly Pulses: 2+ and symmetric Skin: Skin color, texture, turgor normal. No rashes or lesions Lymph nodes: Cervical, supraclavicular, and axillary nodes normal. Neuro: CNs 2-12 intact. DTRs 2+/4 in biceps, brachioradialis, patellars and achilles. Muscle strength 5/5 in upper and lower exremities. Fine resting tremor bilaterally both hands cerebellar function normal. Romberg negative.  No pronator drift.   Gait normal.    Assessment and Plan:  Concussion with no loss of consciousness Neurologic exam is normal except for mild loss of balance.  Counseling given on "brain rest" and advised to stay home from work for rest of week.    A total of 25 minutes was spent with patient more than half of which was spent in counseling patient on the above mentioned issues , reviewing recent imaging studies done in ER , and coordination of care.  Updated Medication List Outpatient Encounter Prescriptions as of 06/25/2014  Medication Sig  . cetirizine (ZYRTEC) 10 MG tablet Take 10 mg by mouth daily.  . fluticasone (FLONASE) 50 MCG/ACT nasal spray Place 2 sprays into the nose daily.  Marland Kitchen losartan (COZAAR) 100 MG tablet TAKE 1 TABLET DAILY  . meclizine (ANTIVERT) 25 MG tablet Take 25 mg by mouth 3 (three) times daily as needed for dizziness.  . VENTOLIN HFA 108 (90 BASE) MCG/ACT inhaler INHALE TWO PUFFS 4 TIMES DAILY AS NEEDED FOR  WHEEZING  . cephALEXin (KEFLEX) 500 MG capsule Take 1 capsule (500 mg total) by mouth 3 (three) times daily.  . [DISCONTINUED] azithromycin (ZITHROMAX) 250 MG tablet Start 2 tablets today then 1 tablet daily for 4 days. (Patient not taking: Reported on 06/25/2014)     No orders of the defined types were placed in this encounter.    No Follow-up on  file.

## 2014-06-25 NOTE — Telephone Encounter (Signed)
The patient fell on Saturday and she received a mild concussion from this fall per the ER physician . She was told to follow up with her primary with in two days she is to return to work on Wednesday.

## 2014-06-25 NOTE — Patient Instructions (Signed)
I am treating your wound with an antibiotic  Called cephalexin  3 times daily for one 7 days Please take a probiotic ( Align, Floraque or Culturelle or the generic version , available OTC ) while you are on the antibiotic to prevent a serious antibiotic associated diarrhea  Called clostirudium dificile colitis and a vaginal yeast infection   You meed to treat your concussion with "brain rest."    No TV,  Reading,  texting or IPAD use for one week  No sports for one week  Concussion A concussion, or closed-head injury, is a brain injury caused by a direct blow to the head or by a quick and sudden movement (jolt) of the head or neck. Concussions are usually not life-threatening. Even so, the effects of a concussion can be serious. If you have had a concussion before, you are more likely to experience concussion-like symptoms after a direct blow to the head.  CAUSES  Direct blow to the head, such as from running into another player during a soccer game, being hit in a fight, or hitting your head on a hard surface.  A jolt of the head or neck that causes the brain to move back and forth inside the skull, such as in a car crash. SIGNS AND SYMPTOMS The signs of a concussion can be hard to notice. Early on, they may be missed by you, family members, and health care providers. You may look fine but act or feel differently. Symptoms are usually temporary, but they may last for days, weeks, or even longer. Some symptoms may appear right away while others may not show up for hours or days. Every head injury is different. Symptoms include:  Mild to moderate headaches that will not go away.  A feeling of pressure inside your head.  Having more trouble than usual:  Learning or remembering things you have heard.  Answering questions.  Paying attention or concentrating.  Organizing daily tasks.  Making decisions and solving problems.  Slowness in thinking, acting or reacting, speaking, or  reading.  Getting lost or being easily confused.  Feeling tired all the time or lacking energy (fatigued).  Feeling drowsy.  Sleep disturbances.  Sleeping more than usual.  Sleeping less than usual.  Trouble falling asleep.  Trouble sleeping (insomnia).  Loss of balance or feeling lightheaded or dizzy.  Nausea or vomiting.  Numbness or tingling.  Increased sensitivity to:  Sounds.  Lights.  Distractions.  Vision problems or eyes that tire easily.  Diminished sense of taste or smell.  Ringing in the ears.  Mood changes such as feeling sad or anxious.  Becoming easily irritated or angry for little or no reason.  Lack of motivation.  Seeing or hearing things other people do not see or hear (hallucinations). DIAGNOSIS Your health care provider can usually diagnose a concussion based on a description of your injury and symptoms. He or she will ask whether you passed out (lost consciousness) and whether you are having trouble remembering events that happened right before and during your injury. Your evaluation might include:  A brain scan to look for signs of injury to the brain. Even if the test shows no injury, you may still have a concussion.  Blood tests to be sure other problems are not present. TREATMENT  Concussions are usually treated in an emergency department, in urgent care, or at a clinic. You may need to stay in the hospital overnight for further treatment.  Tell your health care provider if  you are taking any medicines, including prescription medicines, over-the-counter medicines, and natural remedies. Some medicines, such as blood thinners (anticoagulants) and aspirin, may increase the chance of complications. Also tell your health care provider whether you have had alcohol or are taking illegal drugs. This information may affect treatment.  Your health care provider will send you home with important instructions to follow.  How fast you will  recover from a concussion depends on many factors. These factors include how severe your concussion is, what part of your brain was injured, your age, and how healthy you were before the concussion.  Most people with mild injuries recover fully. Recovery can take time. In general, recovery is slower in older persons. Also, persons who have had a concussion in the past or have other medical problems may find that it takes longer to recover from their current injury. HOME CARE INSTRUCTIONS General Instructions  Carefully follow the directions your health care provider gave you.  Only take over-the-counter or prescription medicines for pain, discomfort, or fever as directed by your health care provider.  Take only those medicines that your health care provider has approved.  Do not drink alcohol until your health care provider says you are well enough to do so. Alcohol and certain other drugs may slow your recovery and can put you at risk of further injury.  If it is harder than usual to remember things, write them down.  If you are easily distracted, try to do one thing at a time. For example, do not try to watch TV while fixing dinner.  Talk with family members or close friends when making important decisions.  Keep all follow-up appointments. Repeated evaluation of your symptoms is recommended for your recovery.  Watch your symptoms and tell others to do the same. Complications sometimes occur after a concussion. Older adults with a brain injury may have a higher risk of serious complications, such as a blood clot on the brain.  Tell your teachers, school nurse, school counselor, coach, athletic trainer, or work Freight forwarder about your injury, symptoms, and restrictions. Tell them about what you can or cannot do. They should watch for:  Increased problems with attention or concentration.  Increased difficulty remembering or learning new information.  Increased time needed to complete tasks  or assignments.  Increased irritability or decreased ability to cope with stress.  Increased symptoms.  Rest. Rest helps the brain to heal. Make sure you:  Get plenty of sleep at night. Avoid staying up late at night.  Keep the same bedtime hours on weekends and weekdays.  Rest during the day. Take daytime naps or rest breaks when you feel tired.  Limit activities that require a lot of thought or concentration. These include:  Doing homework or job-related work.  Watching TV.  Working on the computer.  Avoid any situation where there is potential for another head injury (football, hockey, soccer, basketball, martial arts, downhill snow sports and horseback riding). Your condition will get worse every time you experience a concussion. You should avoid these activities until you are evaluated by the appropriate follow-up health care providers. Returning To Your Regular Activities You will need to return to your normal activities slowly, not all at once. You must give your body and brain enough time for recovery.  Do not return to sports or other athletic activities until your health care provider tells you it is safe to do so.  Ask your health care provider when you can drive, ride a bicycle,  or operate heavy machinery. Your ability to react may be slower after a brain injury. Never do these activities if you are dizzy.  Ask your health care provider about when you can return to work or school. Preventing Another Concussion It is very important to avoid another brain injury, especially before you have recovered. In rare cases, another injury can lead to permanent brain damage, brain swelling, or death. The risk of this is greatest during the first 7-10 days after a head injury. Avoid injuries by:  Wearing a seat belt when riding in a car.  Drinking alcohol only in moderation.  Wearing a helmet when biking, skiing, skateboarding, skating, or doing similar activities.  Avoiding  activities that could lead to a second concussion, such as contact or recreational sports, until your health care provider says it is okay.  Taking safety measures in your home.  Remove clutter and tripping hazards from floors and stairways.  Use grab bars in bathrooms and handrails by stairs.  Place non-slip mats on floors and in bathtubs.  Improve lighting in dim areas. SEEK MEDICAL CARE IF:  You have increased problems paying attention or concentrating.  You have increased difficulty remembering or learning new information.  You need more time to complete tasks or assignments than before.  You have increased irritability or decreased ability to cope with stress.  You have more symptoms than before. Seek medical care if you have any of the following symptoms for more than 2 weeks after your injury:  Lasting (chronic) headaches.  Dizziness or balance problems.  Nausea.  Vision problems.  Increased sensitivity to noise or light.  Depression or mood swings.  Anxiety or irritability.  Memory problems.  Difficulty concentrating or paying attention.  Sleep problems.  Feeling tired all the time. SEEK IMMEDIATE MEDICAL CARE IF:  You have severe or worsening headaches. These may be a sign of a blood clot in the brain.  You have weakness (even if only in one hand, leg, or part of the face).  You have numbness.  You have decreased coordination.  You vomit repeatedly.  You have increased sleepiness.  One pupil is larger than the other.  You have convulsions.  You have slurred speech.  You have increased confusion. This may be a sign of a blood clot in the brain.  You have increased restlessness, agitation, or irritability.  You are unable to recognize people or places.  You have neck pain.  It is difficult to wake you up.  You have unusual behavior changes.  You lose consciousness. MAKE SURE YOU:  Understand these instructions.  Will watch your  condition.  Will get help right away if you are not doing well or get worse. Document Released: 08/15/2003 Document Revised: 05/30/2013 Document Reviewed: 12/15/2012 [facility] Patient Information 2015 [place], [place]. This information is not intended to replace advice given to you by your health care provider. Make sure you discuss any questions you have with your health care provider.

## 2014-06-25 NOTE — Progress Notes (Signed)
Pre-visit discussion using our clinic review tool. No additional management support is needed unless otherwise documented below in the visit note.  

## 2014-06-26 ENCOUNTER — Telehealth: Payer: Self-pay | Admitting: Internal Medicine

## 2014-06-26 NOTE — Telephone Encounter (Signed)
Patient notified and voiced understanding.

## 2014-06-26 NOTE — Telephone Encounter (Signed)
Patient says the medication the doctor prescribed yesterday for her to take, Suzie Portela said it would interfere with her reaction to amoxicillin.  She wants to know if she should take this medication or not.

## 2014-06-26 NOTE — Telephone Encounter (Signed)
Patient had rash about 15 years ago and stated she did  Mention this at appointment please advise?

## 2014-06-26 NOTE — Telephone Encounter (Signed)
i am aware of remote amox allergy,  Most peolole can tolerate keflex and it is the only choice available to cover skin infections for her.  If she fevelops a rash,  Stop the medication

## 2014-06-28 DIAGNOSIS — S060X0A Concussion without loss of consciousness, initial encounter: Secondary | ICD-10-CM | POA: Insufficient documentation

## 2014-06-28 NOTE — Assessment & Plan Note (Signed)
Neurologic exam is normal except for mild loss of balance.  Counseling given on "brain rest" and advised to stay home from work for rest of week.

## 2014-07-10 ENCOUNTER — Telehealth: Payer: Self-pay | Admitting: *Deleted

## 2014-07-10 NOTE — Addendum Note (Signed)
Addended by: Crecencio Mc on: 07/10/2014 08:23 PM   Modules accepted: Orders

## 2014-07-10 NOTE — Telephone Encounter (Signed)
Pt called states she started her menstrual today and is cancelling her appoint for tomorrow for evaluation for yeast infection.  Pt is requesting an Rx to be sent to [facility] on [address].  She wanted to remind you that she is allergic to Monistat.  Please advise

## 2014-07-10 NOTE — Telephone Encounter (Signed)
The only available treatment for yeast infection is fluconazole, which is distantly related to miconazole.  What reaction did she have to monistat?

## 2014-07-11 ENCOUNTER — Ambulatory Visit: Payer: BLUE CROSS/BLUE SHIELD | Admitting: Nurse Practitioner

## 2014-07-11 MED ORDER — FLUCONAZOLE 150 MG PO TABS: 150.0000 mg | ORAL_TABLET | Freq: Every day | ORAL | Status: DC

## 2014-07-11 NOTE — Telephone Encounter (Deleted)
Please send Diflucan Rx.  No Rx sent.

## 2014-07-11 NOTE — Addendum Note (Signed)
Addended by: Johnsie Cancel on: 07/11/2014 08:22 AM   Modules accepted: Orders

## 2014-07-12 ENCOUNTER — Other Ambulatory Visit: Payer: Self-pay | Admitting: Internal Medicine

## 2014-07-16 ENCOUNTER — Telehealth: Payer: Self-pay | Admitting: Internal Medicine

## 2014-07-16 NOTE — Telephone Encounter (Signed)
Patient stated she feels really tired,  since going back to work. Patient stated she is having  Ringing in ears at night. 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 She wants to know if should be over the concussion from 06/22/14.  Patient also stated when she walks outside she thinks she sees little spots but not each time she walks out.

## 2014-07-16 NOTE — Telephone Encounter (Signed)
She should be, but often patients develop a post concussion syndrome, like what she is describing .  She still needs to be careful about her environment and activity level.

## 2014-07-16 NOTE — Telephone Encounter (Signed)
Patient notified and voiced understanding.

## 2014-07-31 ENCOUNTER — Telehealth: Payer: Self-pay | Admitting: Internal Medicine

## 2014-07-31 ENCOUNTER — Ambulatory Visit: Payer: Self-pay | Admitting: Endocrinology

## 2014-07-31 NOTE — Telephone Encounter (Signed)
Patient Name: Dawn Russell  DOB: 1965/12/02    Initial Comment Caller states last night she had horrible pain in her lower abdominal. Now she is not having any pain. Just has questions. Did have 3 BM's yesterday, not her normal.   Nurse Assessment  Nurse: Leilani Merl, RN, Heather Date/Time (Eastern Time): 07/31/2014 9:45:38 AM  Confirm and document reason for call. If symptomatic, describe symptoms. ---Caller states last night she had horrible pain in her lower abdominal. Now she is not having any pain. Just has questions. Did have 3 BM's yesterday, not her normal. It lasted about 1.5-2 hours and it got severe at one point. Caller states that she feels fine now  Has the patient traveled out of the country within the last 30 days? ---Not Applicable  Does the patient require triage? ---Yes  Related visit to physician within the last 2 weeks? ---No  Does the PT have any chronic conditions? (i.e. diabetes, asthma, etc.) ---Yes  List chronic conditions. ---previous kidney stone, HTN  Did the patient indicate they were pregnant? ---No     Guidelines    Guideline Title Affirmed Question Affirmed Notes  Abdominal Pain - Female [1] MILD-MODERATE abdominal pain AND [2] constant and [3] present < 2 hours (all triage questions negative)    Final Disposition User   See Physician within 24 Hours Standifer, RN, Water quality scientist    Comments  Appt made at 11:15 am with Dr. Howell Rucks

## 2014-07-31 NOTE — Telephone Encounter (Signed)
Just a FYI

## 2014-07-31 NOTE — Telephone Encounter (Signed)
Patient scheduled with carrie Doss NP 08/01/14?

## 2014-07-31 NOTE — Telephone Encounter (Signed)
See her on schedule for 8:45 am tomorrow.

## 2014-08-01 ENCOUNTER — Encounter (INDEPENDENT_AMBULATORY_CARE_PROVIDER_SITE_OTHER): Payer: Self-pay

## 2014-08-01 ENCOUNTER — Encounter: Payer: Self-pay | Admitting: Nurse Practitioner

## 2014-08-01 ENCOUNTER — Ambulatory Visit (INDEPENDENT_AMBULATORY_CARE_PROVIDER_SITE_OTHER): Payer: BLUE CROSS/BLUE SHIELD | Admitting: Nurse Practitioner

## 2014-08-01 VITALS — BP 120/80 | HR 91 | Temp 98.0°F | Resp 14 | Ht 66.5 in | Wt 167.0 lb

## 2014-08-01 DIAGNOSIS — R1031 Right lower quadrant pain: Secondary | ICD-10-CM

## 2014-08-01 DIAGNOSIS — R1032 Left lower quadrant pain: Secondary | ICD-10-CM

## 2014-08-01 NOTE — Patient Instructions (Addendum)
Let us know if it happens again

## 2014-08-01 NOTE — Progress Notes (Signed)
Subjective:    Patient ID: Dawn Russell, female    DOB: Jun 19, 1965, 49 y.o.   MRN: [id number]  HPI  Dawn Russell is a 49 yo female with a CC of lower abdominal pain.   1) She woke up and had to urinate two nights ago she reports and had severe pain in her lower abdomen (points to suprapubic area bilaterally). She reports it spontaneously resolved after 1.5 hours of pain. She went to urinate twice more during this painful episode and produced little urine. Denies hematuria, dysuria, odor, diarrhea, nausea, vomiting. She reports discussing with her husband if they should go to the ER when it resolved.   Review of Systems  Constitutional: Negative for fever, chills, diaphoresis and fatigue.  Respiratory: Negative for chest tightness, shortness of breath and wheezing.   Cardiovascular: Negative for chest pain, palpitations and leg swelling.  Gastrointestinal: Positive for abdominal pain. Negative for nausea, vomiting, diarrhea, constipation, blood in stool, abdominal distention, anal bleeding and rectal pain.  Genitourinary: Negative for dysuria.  Skin: Negative for rash.  Neurological: Negative for dizziness, weakness, numbness and headaches.  Psychiatric/Behavioral: The patient is not nervous/anxious.    Past Medical History  Diagnosis Date  . History of renal calculi July 2012    s/p lithotripsy Dahlstedt  . Hypertension   . 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 Abnormal Pap smear     ASCUS  . Pelvic pain in female   . Dyspareunia   . Pelvic relaxation   . Kidney stones   . Vaginitis   . GERD (gastroesophageal reflux disease)   . Fibrocystic breast changes     History   Social History  . Marital Status: Married    Spouse Name: N/A  . Number of Children: N/A  . Years of Education: N/A   Occupational History  . Not on file.   Social History Main Topics  . Smoking status: Never Smoker   . Smokeless tobacco: Never Used  . Alcohol  Use: No  . Drug Use: No  . Sexual Activity: Yes    Birth Control/ Protection: Surgical     Comment: vas.   Other Topics Concern  . Not on file   Social History Narrative    Past Surgical History  Procedure Laterality Date  . Oophorectomy  2004    due to large benign cyst,  elevated CA125  . Cesarean section    . Cholecystectomy      Family History  Problem Relation Age of Onset  . Hypertension Father   . Stroke Father   . Hypertension Mother   . Hyperlipidemia Mother   . Cancer Sister     brain tumor    Allergies  Allergen Reactions  . Amoxicillin   . Monistat [Miconazole]   . Septra [Bactrim]   . Latex Rash    Current Outpatient Prescriptions on File Prior to Visit  Medication Sig Dispense Refill  . cetirizine (ZYRTEC) 10 MG tablet Take 10 mg by mouth daily.    . fluticasone (FLONASE) 50 MCG/ACT nasal spray Place 2 sprays into the nose daily.    Marland Kitchen losartan (COZAAR) 100 MG tablet TAKE 1 TABLET DAILY 90 tablet 1  . meclizine (ANTIVERT) 25 MG tablet Take 25 mg by mouth 3 (three) times daily as needed for dizziness.    . VENTOLIN HFA 108 (90 BASE) MCG/ACT inhaler INHALE TWO PUFFS 4 TIMES DAILY AS NEEDED FOR  WHEEZING 18 g PRN  No current facility-administered medications on file prior to visit.      Objective:   Physical Exam  Constitutional: She is oriented to person, place, and time. She appears well-developed and well-nourished. No distress.  BP 120/80 mmHg  Pulse 91  Temp(Src) 98 F (36.7 C) (Oral)  Resp 14  Ht 5' 6.5" (1.689 m)  Wt 167 lb (75.751 kg)  BMI 26.55 kg/m2  SpO2 97%  LMP  (Approximate)   HENT:  Head: Normocephalic and atraumatic.  Right Ear: External ear normal.  Left Ear: External ear normal.  Eyes: Right eye exhibits no discharge. Left eye exhibits no discharge. No scleral icterus.  Cardiovascular: Normal rate, regular rhythm, normal heart sounds and intact distal pulses.  Exam reveals no gallop and no friction rub.   No murmur  heard. Pulmonary/Chest: Effort normal and breath sounds normal. No respiratory distress. She has no wheezes. She has no rales. She exhibits no tenderness.  Abdominal: Soft. Bowel sounds are normal. She exhibits no shifting dullness, no distension, no pulsatile liver, no fluid wave, no abdominal bruit, no ascites, no pulsatile midline mass and no mass. There is no hepatosplenomegaly. There is no tenderness. There is no rebound, no guarding and no CVA tenderness. Hernia confirmed negative in the ventral area.  Neurological: She is alert and oriented to person, place, and time. No cranial nerve deficit. She exhibits normal muscle tone. Coordination normal.  Skin: Skin is warm and dry. No rash noted. She is not diaphoretic.  Psychiatric: She has a normal mood and affect. Her behavior is normal. Judgment and thought content normal.      Assessment & Plan:

## 2014-08-01 NOTE — Progress Notes (Signed)
Pre visit review using our clinic review tool, if applicable. No additional management support is needed unless otherwise documented below in the visit note. 

## 2014-08-04 DIAGNOSIS — R1032 Left lower quadrant pain: Principal | ICD-10-CM

## 2014-08-04 DIAGNOSIS — R1031 Right lower quadrant pain: Secondary | ICD-10-CM | POA: Insufficient documentation

## 2014-08-04 NOTE — Assessment & Plan Note (Signed)
Resolved. Pt has no complaints today and wanted to discuss the episode. She has hx of nephrolithiasis. Due to the sudden onset and resolution it may be possible she passed a stone. No further work up warranted at this time. We disscussed possible work up being CBC w/ diff, UA, or pelvic US and we came to the agreement if recurrs we can do the work up. FU prn.

## 2014-10-31 LAB — HM PAP SMEAR: HM Pap smear: NORMAL

## 2014-11-06 ENCOUNTER — Telehealth: Payer: Self-pay | Admitting: Internal Medicine

## 2014-11-06 NOTE — Telephone Encounter (Signed)
PAP smear adedd to  quick abstraction

## 2015-01-26 ENCOUNTER — Other Ambulatory Visit: Payer: Self-pay | Admitting: Internal Medicine

## 2015-04-26 ENCOUNTER — Other Ambulatory Visit: Payer: Self-pay | Admitting: Internal Medicine

## 2015-04-29 ENCOUNTER — Ambulatory Visit (INDEPENDENT_AMBULATORY_CARE_PROVIDER_SITE_OTHER): Payer: BLUE CROSS/BLUE SHIELD | Admitting: Family Medicine

## 2015-04-29 ENCOUNTER — Encounter: Payer: Self-pay | Admitting: Family Medicine

## 2015-04-29 VITALS — BP 120/80 | HR 98 | Temp 98.8°F | Resp 16 | Wt 175.0 lb

## 2015-04-29 DIAGNOSIS — N939 Abnormal uterine and vaginal bleeding, unspecified: Secondary | ICD-10-CM | POA: Diagnosis not present

## 2015-04-29 DIAGNOSIS — J0110 Acute frontal sinusitis, unspecified: Secondary | ICD-10-CM

## 2015-04-29 LAB — TSH: TSH: 1.24 u[IU]/mL (ref 0.35–4.50)

## 2015-04-29 MED ORDER — DOXYCYCLINE HYCLATE 100 MG PO TABS: 100.0000 mg | ORAL_TABLET | Freq: Two times a day (BID) | ORAL | Status: DC

## 2015-04-29 NOTE — Progress Notes (Signed)
Patient ID: Dawn Russell, female   DOB: 12/24/1965, 49 y.o.   MRN: [id number]  Tommi Rumps, MD Phone: [phone]  Dawn Russell is a 49 y.o. female who presents today for same-day visit.  Sinus congestion: Patient notes starting on last Monday she developed scratchy throat. She then developed cough. Cough was productive of green yellow mucus. She notes she initially improved some but then developed sinus pressure with frontal sinus pressure. She is blowing yellow mucus out of her nose. She does not note any fevers. She's not had any aching. She denies any shortness of breath. She denies any chest pain. She had sick contact is her husband. States this feels like her typical sinus infections.  Prolonged period: Patient notes for the last 2 weeks she has had menstrual bleeding. She notes typically she has 1 period a month and bleeds for 3-5 days. She notes this time she is going through a pad every 3 hours. She does note some clots. She denies vision changes and headaches. She's not had any palpitations, chest pain, or shortness of breath. She's not had any lightheadedness. She has no abdominal pain with this. She states she could not be pregnant at husband had a vasectomy in the past.  PMH: nonsmoker.   ROS see history of present illness  Objective  Physical Exam Filed Vitals:   04/29/15 1403 04/29/15 1423  BP: 120/80   Pulse: 108 98  Temp: 98.8 F (37.1 C)   Resp: 16    Physical Exam  Constitutional: She is well-developed, well-nourished, and in no distress.  HENT:  Head: Normocephalic and atraumatic.  Right Ear: External ear normal.  Left Ear: External ear normal.  Mouth/Throat: No oropharyngeal exudate.  Mild posterior oropharyngeal erythema, no tonsillar enlargement, normal TMs bilaterally  Eyes: Conjunctivae are normal. Pupils are equal, round, and reactive to light.  Neck: Neck supple.  Cardiovascular: Normal rate, regular rhythm and normal heart sounds.  Exam reveals  no gallop and no friction rub.   No murmur heard. Pulmonary/Chest: Effort normal and breath sounds normal. No respiratory distress. She has no wheezes. She has no rales.  Abdominal: Soft. Bowel sounds are normal. She exhibits no distension. There is no tenderness. There is no rebound and no guarding.  Genitourinary:  Normal labia, normal vaginal mucosa, there is blood in the vaginal vault, cervix was appreciated though it was oriented posteriorly despite repositioning of speculum and use of Fox swab was unable to get a full view of the cervical os, partial view was obtained and it does appear that blood was coming from the os, no abnormalities appreciated on what view was obtained, no adnexal tenderness or masses, no cervical motion tenderness or suprapubic tenderness  Lymphadenopathy:    She has no cervical adenopathy.  Neurological: She is alert. Gait normal.  CN 2-12 intact, 5/5 strength in bilateral biceps, triceps, grip, quads, hamstrings, plantar and dorsiflexion, sensation to light touch intact in bilateral UE and LE, normal gait, 2+ patellar reflexes  Skin: Skin is warm and dry. She is not diaphoretic.     Assessment/Plan: Please see individual problem list.  Sinusitis, acute Patient with symptoms of sinus congestion and pressure for 7 days. Had improved then worsened. This is consistent with bacterial sinus infection. We will treat her with doxycycline given her amoxicillin allergy. She was advised to take a probiotic on this medication. She is given return precautions.  Abnormal bleeding in menstrual cycle Patient with prolonged period. She's had 2 weeks of menstrual  bleeding. Exam performed today to evaluate this further, though incomplete view of cervix obtained. Given this we will refer her back to gynecology for further exam and evaluation. She is asymptomatic at this time. Recheck of pulse is in the normal range. We'll check a CBC and a TSH to evaluate for causes and anemia.  Unlikely to be related to prolactin elevation given normal vision and no headaches. Did discuss obtaining a urine pregnancy or beta hCG to evaluate for pregnancy, though patient declined this. Given that she is asymptomatic and her vitals are stable we will hold off on treatment given that I was unable to fully visualize the cervix and assess for structural lesions. If her CBC returns that she is anemic we will consider treatment. If she has persistent bleeding or develops symptoms she will seek medical attention. Given return precautions.    Orders Placed This Encounter  Procedures  . TSH  . CBC  . Ambulatory referral to Gynecology    Referral Priority:  Routine    Referral Type:  Consultation    Referral Reason:  Specialty Services Required    Requested Specialty:  Gynecology    Number of Visits Requested:  1    Meds ordered this encounter  Medications  . doxycycline (VIBRA-TABS) 100 MG tablet    Sig: Take 1 tablet (100 mg total) by mouth 2 (two) times daily.    Dispense:  14 tablet    Refill:  0    Tommi Rumps

## 2015-04-29 NOTE — Progress Notes (Signed)
Pre visit review using our clinic review tool, if applicable. No additional management support is needed unless otherwise documented below in the visit note. 

## 2015-04-29 NOTE — Patient Instructions (Signed)
Nice to meet you. You likely have a sinus infection. We will treat this with doxycycline. Please stay well hydrated. We'll check some blood work to evaluate your prolonged period. We will refer you back to her gynecologist for further evaluation. Inclined if you develop fevers, cough, shortness of breath, chest pain, palpitations, lightheadedness, or any new or change in symptoms, or increased vaginal bleeding please seek medical attention.

## 2015-04-30 DIAGNOSIS — J0110 Acute frontal sinusitis, unspecified: Secondary | ICD-10-CM | POA: Insufficient documentation

## 2015-04-30 DIAGNOSIS — J019 Acute sinusitis, unspecified: Secondary | ICD-10-CM | POA: Insufficient documentation

## 2015-04-30 DIAGNOSIS — N939 Abnormal uterine and vaginal bleeding, unspecified: Secondary | ICD-10-CM | POA: Insufficient documentation

## 2015-04-30 LAB — CBC
HEMATOCRIT: 38.2 % (ref 36.0–46.0)
Hemoglobin: 12.7 g/dL (ref 12.0–15.0)
MCHC: 33.2 g/dL (ref 30.0–36.0)
MCV: 82.2 fl (ref 78.0–100.0)
PLATELETS: 321 10*3/uL (ref 150.0–400.0)
RBC: 4.65 Mil/uL (ref 3.87–5.11)
RDW: 13.7 % (ref 11.5–15.5)
WBC: 13.3 10*3/uL — ABNORMAL HIGH (ref 4.0–10.5)

## 2015-04-30 NOTE — Assessment & Plan Note (Addendum)
Patient with prolonged period. She's had 2 weeks of menstrual bleeding. Exam performed today to evaluate this further, though incomplete view of cervix obtained. Given this we will refer her back to gynecology for further exam and evaluation. She is asymptomatic at this time. Recheck of pulse is in the normal range. We'll check a CBC and a TSH to evaluate for causes and anemia. Unlikely to be related to prolactin elevation given normal vision and no headaches. Did discuss obtaining a urine pregnancy or beta hCG to evaluate for pregnancy, though patient declined this. Given that she is asymptomatic and her vitals are stable we will hold off on treatment given that I was unable to fully visualize the cervix and assess for structural lesions. If her CBC returns that she is anemic we will consider treatment. If she has persistent bleeding or develops symptoms she will seek medical attention. Given return precautions.

## 2015-04-30 NOTE — Assessment & Plan Note (Addendum)
Patient with symptoms of sinus congestion and pressure for 7 days. Had improved then worsened. This is consistent with bacterial sinus infection. We will treat her with doxycycline given her amoxicillin allergy. She was advised to take a probiotic on this medication. She is given return precautions.

## 2015-05-06 ENCOUNTER — Telehealth: Payer: Self-pay

## 2015-05-06 NOTE — Telephone Encounter (Signed)
Patient should follow-up for re-evaluation if she is not improved.

## 2015-05-06 NOTE — Telephone Encounter (Signed)
Patient stated that she feels better every day. She started taking Mucinex and she feels that has helped her. I told her that if her symptoms get worse to make an appointment to be seen, but if she continues to feel better then she shouldn't need to be seen.

## 2015-05-06 NOTE — Telephone Encounter (Signed)
Pt is till congested and not feeling well. She would like to know what else can be done for her

## 2015-06-27 ENCOUNTER — Ambulatory Visit (INDEPENDENT_AMBULATORY_CARE_PROVIDER_SITE_OTHER): Payer: BLUE CROSS/BLUE SHIELD | Admitting: Surgical

## 2015-06-27 DIAGNOSIS — Z23 Encounter for immunization: Secondary | ICD-10-CM | POA: Diagnosis not present

## 2015-06-27 NOTE — Progress Notes (Signed)
Flu shot given to patient in Left Deltoid. Patient tolerated well.

## 2015-08-16 ENCOUNTER — Telehealth: Payer: Self-pay | Admitting: Gastroenterology

## 2015-08-16 NOTE — Telephone Encounter (Signed)
triage

## 2015-08-23 ENCOUNTER — Other Ambulatory Visit: Payer: Self-pay

## 2015-08-23 NOTE — Telephone Encounter (Signed)
Pt scheduled for screening colonoscopy at [facility] on 10/11/15. Instructs/rx mailed. Please precert insurance

## 2015-08-23 NOTE — Telephone Encounter (Signed)
Gastroenterology Pre-Procedure Review  Request Date: 10/11/15 Requesting Physician: Dr. Deborra Medina  PATIENT REVIEW QUESTIONS: The patient responded to the following health history questions as indicated:    1. Are you having any GI issues? no 2. Do you have a personal history of Polyps? no 3. Do you have a family history of Colon Cancer or Polyps? no 4. Diabetes Mellitus? no 5. Joint replacements in the past 12 months?no 6. Major health problems in the past 3 months?no 7. Any artificial heart valves, MVP, or defibrillator?no    MEDICATIONS & ALLERGIES:    Patient reports the following regarding taking any anticoagulation/antiplatelet therapy:   Plavix, Coumadin, Eliquis, Xarelto, Lovenox, Pradaxa, Brilinta, or Effient? no Aspirin? no  Patient confirms/reports the following medications:  Current Outpatient Prescriptions  Medication Sig Dispense Refill  . cetirizine (ZYRTEC) 10 MG tablet Take 10 mg by mouth daily.    Marland Kitchen doxycycline (VIBRA-TABS) 100 MG tablet Take 1 tablet (100 mg total) by mouth 2 (two) times daily. 14 tablet 0  . fluticasone (FLONASE) 50 MCG/ACT nasal spray Place 2 sprays into the nose daily.    Marland Kitchen losartan (COZAAR) 100 MG tablet TAKE 1 TABLET DAILY 90 tablet 1  . meclizine (ANTIVERT) 25 MG tablet Take 25 mg by mouth 3 (three) times daily as needed for dizziness.    . VENTOLIN HFA 108 (90 BASE) MCG/ACT inhaler INHALE TWO PUFFS 4 TIMES DAILY AS NEEDED FOR  WHEEZING 18 g PRN   No current facility-administered medications for this visit.    Patient confirms/reports the following allergies:  Allergies  Allergen Reactions  . Amoxicillin   . Monistat [Miconazole]   . Septra [Bactrim]   . Latex Rash    No orders of the defined types were placed in this encounter.    AUTHORIZATION INFORMATION Primary Insurance: 1D#: Group #:  Secondary Insurance: 1D#: Group #:  SCHEDULE INFORMATION: Date: 10/11/15 Time: Location: [place]

## 2015-10-02 ENCOUNTER — Encounter: Payer: Self-pay | Admitting: *Deleted

## 2015-10-11 ENCOUNTER — Ambulatory Visit
Admission: RE | Admit: 2015-10-11 | Discharge: 2015-10-11 | Disposition: A | Discharge status: Home / Self Care | Payer: BLUE CROSS/BLUE SHIELD | Source: Ambulatory Visit | Attending: Gastroenterology | Admitting: Gastroenterology

## 2015-10-11 ENCOUNTER — Encounter
Admission: RE | Disposition: A | Discharge status: Home / Self Care | Payer: Self-pay | Source: Ambulatory Visit | Attending: Gastroenterology

## 2015-10-11 ENCOUNTER — Ambulatory Visit: Payer: BLUE CROSS/BLUE SHIELD | Admitting: Student in an Organized Health Care Education/Training Program

## 2015-10-11 DIAGNOSIS — Z1211 Encounter for screening for malignant neoplasm of colon: Secondary | ICD-10-CM | POA: Insufficient documentation

## 2015-10-11 DIAGNOSIS — Z883 Allergy status to other anti-infective agents status: Secondary | ICD-10-CM | POA: Insufficient documentation

## 2015-10-11 DIAGNOSIS — Z8249 Family history of ischemic heart disease and other diseases of the circulatory system: Secondary | ICD-10-CM | POA: Insufficient documentation

## 2015-10-11 DIAGNOSIS — Z823 Family history of stroke: Secondary | ICD-10-CM | POA: Insufficient documentation

## 2015-10-11 DIAGNOSIS — Z8349 Family history of other endocrine, nutritional and metabolic diseases: Secondary | ICD-10-CM | POA: Diagnosis not present

## 2015-10-11 DIAGNOSIS — D125 Benign neoplasm of sigmoid colon: Secondary | ICD-10-CM | POA: Diagnosis not present

## 2015-10-11 DIAGNOSIS — J45990 Exercise induced bronchospasm: Secondary | ICD-10-CM | POA: Insufficient documentation

## 2015-10-11 DIAGNOSIS — Z79899 Other long term (current) drug therapy: Secondary | ICD-10-CM | POA: Insufficient documentation

## 2015-10-11 DIAGNOSIS — K641 Second degree hemorrhoids: Secondary | ICD-10-CM | POA: Diagnosis not present

## 2015-10-11 DIAGNOSIS — Z881 Allergy status to other antibiotic agents status: Secondary | ICD-10-CM | POA: Diagnosis not present

## 2015-10-11 DIAGNOSIS — I10 Essential (primary) hypertension: Secondary | ICD-10-CM | POA: Insufficient documentation

## 2015-10-11 DIAGNOSIS — Z87442 Personal history of urinary calculi: Secondary | ICD-10-CM | POA: Diagnosis not present

## 2015-10-11 DIAGNOSIS — J45998 Other asthma: Secondary | ICD-10-CM | POA: Insufficient documentation

## 2015-10-11 DIAGNOSIS — Z808 Family history of malignant neoplasm of other organs or systems: Secondary | ICD-10-CM | POA: Insufficient documentation

## 2015-10-11 DIAGNOSIS — D126 Benign neoplasm of colon, unspecified: Secondary | ICD-10-CM | POA: Insufficient documentation

## 2015-10-11 DIAGNOSIS — K219 Gastro-esophageal reflux disease without esophagitis: Secondary | ICD-10-CM | POA: Insufficient documentation

## 2015-10-11 DIAGNOSIS — Z9049 Acquired absence of other specified parts of digestive tract: Secondary | ICD-10-CM | POA: Diagnosis not present

## 2015-10-11 DIAGNOSIS — Z91040 Latex allergy status: Secondary | ICD-10-CM | POA: Diagnosis not present

## 2015-10-11 HISTORY — PX: COLONOSCOPY WITH PROPOFOL: SHX5780

## 2015-10-11 HISTORY — DX: Unspecified asthma, uncomplicated: J45.909

## 2015-10-11 HISTORY — PX: POLYPECTOMY: SHX5525

## 2015-10-11 HISTORY — DX: Concussion with loss of consciousness of unspecified duration, initial encounter: S06.0X9A

## 2015-10-11 SURGERY — COLONOSCOPY WITH PROPOFOL
Anesthesia: Monitor Anesthesia Care

## 2015-10-11 MED ORDER — LIDOCAINE HCL (CARDIAC) 20 MG/ML IV SOLN
INTRAVENOUS | Status: DC | PRN
  Administered 2015-10-11: 20 mg via INTRAVENOUS

## 2015-10-11 MED ORDER — LACTATED RINGERS IV SOLN
INTRAVENOUS | Status: DC
  Administered 2015-10-11: 07:00:00 via INTRAVENOUS

## 2015-10-11 MED ORDER — PROPOFOL 10 MG/ML IV BOLUS
INTRAVENOUS | Status: DC | PRN
  Administered 2015-10-11: 25 mg via INTRAVENOUS
  Administered 2015-10-11: 100 mg via INTRAVENOUS
  Administered 2015-10-11 (×6): 25 mg via INTRAVENOUS

## 2015-10-11 MED ORDER — SIMETHICONE 40 MG/0.6ML PO SUSP
ORAL | Status: DC | PRN
  Administered 2015-10-11: 08:00:00

## 2015-10-11 SURGICAL SUPPLY — 22 items
CANISTER SUCT 1200ML W/VALVE (MISCELLANEOUS) ×3 IMPLANT
CLIP HMST 235XBRD CATH ROT (MISCELLANEOUS) IMPLANT
CLIP RESOLUTION 360 11X235 (MISCELLANEOUS)
FCP ESCP3.2XJMB 240X2.8X (MISCELLANEOUS)
FORCEPS BIOP RAD 4 LRG CAP 4 (CUTTING FORCEPS) IMPLANT
FORCEPS BIOP RJ4 240 W/NDL (MISCELLANEOUS)
FORCEPS ESCP3.2XJMB 240X2.8X (MISCELLANEOUS) IMPLANT
GOWN CVR UNV OPN BCK APRN NK (MISCELLANEOUS) ×4 IMPLANT
GOWN ISOL THUMB LOOP REG UNIV (MISCELLANEOUS) ×6
INJECTOR VARIJECT VIN23 (MISCELLANEOUS) IMPLANT
KIT DEFENDO VALVE AND CONN (KITS) IMPLANT
KIT ENDO PROCEDURE OLY (KITS) ×3 IMPLANT
MARKER SPOT ENDO TATTOO 5ML (MISCELLANEOUS) IMPLANT
PAD GROUND ADULT SPLIT (MISCELLANEOUS) IMPLANT
PROBE APC STR FIRE (PROBE) IMPLANT
SNARE SHORT THROW 13M SML OVAL (MISCELLANEOUS) ×1 IMPLANT
SNARE SHORT THROW 30M LRG OVAL (MISCELLANEOUS) IMPLANT
SNARE SNG USE RND 15MM (INSTRUMENTS) IMPLANT
SPOT EX ENDOSCOPIC TATTOO (MISCELLANEOUS)
TRAP ETRAP POLY (MISCELLANEOUS) IMPLANT
VARIJECT INJECTOR VIN23 (MISCELLANEOUS)
WATER STERILE IRR 250ML POUR (IV SOLUTION) ×3 IMPLANT

## 2015-10-11 NOTE — H&P (Signed)
[facility]  [address] [place], [place] [address] Phone: [phone] Fax : [phone]  Primary Care Physician:  Crecencio Mc, MD Primary Gastroenterologist:  Dr. Allen Norris  Pre-Procedure History & Physical: HPI:  Dawn Russell is a 50 y.o. female is here for a screening colonoscopy.   Past Medical History  Diagnosis Date  . History of renal calculi July 2012    s/p lithotripsy Dahlstedt  . 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 Abnormal Pap smear     ASCUS  . Pelvic pain in female   . Dyspareunia   . Pelvic relaxation   . Kidney stones   . Vaginitis   . Fibrocystic breast changes   . Asthma     exercise and cold(weather) induced  . GERD (gastroesophageal reflux disease)     in past  . Concussion 06/24/14    improved  . Hypertension          Past Surgical History  Procedure Laterality Date  . Oophorectomy  2004    due to large benign cyst,  elevated CA125  . Cesarean section    . Cholecystectomy      Prior to Admission medications   Medication Sig Start Date End Date Taking? Authorizing Provider  cetirizine (ZYRTEC) 10 MG tablet Take 10 mg by mouth daily as needed. Reported on 08/23/2015   Yes Historical Provider, MD  losartan (COZAAR) 100 MG tablet TAKE 1 TABLET DAILY 04/26/15  Yes Rubbie Battiest, NP  VENTOLIN HFA 108 (90 BASE) MCG/ACT inhaler INHALE TWO PUFFS 4 TIMES DAILY AS NEEDED FOR  WHEEZING 07/22/11  Yes Crecencio Mc, MD    Allergies as of 08/23/2015 - Review Complete 08/23/2015  Allergen Reaction Noted  . Amoxicillin  03/30/2011  . Monistat [miconazole]  01/12/2012  . Septra [bactrim]  03/30/2011  . Latex Rash 07/23/2011    Family History  Problem Relation Age of Onset  . Hypertension Father   . Stroke Father   . Hypertension Mother   . Hyperlipidemia Mother   . Cancer Sister     brain tumor    Social History   Social History  . Marital Status: Married    Spouse Name: N/A    . Number of Children: N/A  . Years of Education: N/A   Occupational History  . Not on file.   Social History Main Topics  . Smoking status: Never Smoker   . Smokeless tobacco: Never Used  . Alcohol Use: No  . Drug Use: No  . Sexual Activity: Yes    Birth Control/ Protection: Surgical     Comment: vas.   Other Topics Concern  . Not on file   Social History Narrative    Review of Systems: See HPI, otherwise negative ROS  Physical Exam: BP 126/69 mmHg  Pulse 89  Temp(Src) 98.1 F (36.7 C) (Temporal)  Resp 18  Ht 5' 6.5" (1.689 m)  Wt 170 lb (77.111 kg)  BMI 27.03 kg/m2  SpO2 97%  LMP 08/25/2015 (Approximate) General:   Alert,  pleasant and cooperative in NAD Head:  Normocephalic and atraumatic. Neck:  Supple; no masses or thyromegaly. Lungs:  Clear throughout to auscultation.    Heart:  Regular rate and rhythm. Abdomen:  Soft, nontender and nondistended. Normal bowel sounds, without guarding, and without rebound.   Neurologic:  Alert and  oriented x4;  grossly normal neurologically.  Impression/Plan: Dawn Russell is now here to undergo a  screening colonoscopy.  Risks, benefits, and alternatives regarding colonoscopy have been reviewed with the patient.  Questions have been answered.  All parties agreeable.

## 2015-10-11 NOTE — Transfer of Care (Signed)
Immediate Anesthesia Transfer of Care Note  Patient: Dawn Russell  Procedure(s) Performed: Procedure(s) with comments: COLONOSCOPY WITH PROPOFOL (N/A) - latex sensitivity  Patient Location: PACU  Anesthesia Type: MAC  Level of Consciousness: awake, alert  and patient cooperative  Airway and Oxygen Therapy: Patient Spontanous Breathing and Patient connected to supplemental oxygen  Post-op Assessment: Post-op Vital signs reviewed, Patient's Cardiovascular Status Stable, Respiratory Function Stable, Patent Airway and No signs of Nausea or vomiting  Post-op Vital Signs: Reviewed and stable  Complications: No apparent anesthesia complications

## 2015-10-11 NOTE — Anesthesia Postprocedure Evaluation (Signed)
Anesthesia Post Note  Patient: Dawn Russell  Procedure(s) Performed: Procedure(s) (LRB): COLONOSCOPY WITH PROPOFOL (N/A) POLYPECTOMY  Patient location during evaluation: PACU Anesthesia Type: MAC Level of consciousness: awake and alert and oriented Pain management: pain level controlled Vital Signs Assessment: post-procedure vital signs reviewed and stable Respiratory status: spontaneous breathing and nonlabored ventilation Cardiovascular status: stable Postop Assessment: no signs of nausea or vomiting and adequate PO intake Anesthetic complications: no    Estill Batten

## 2015-10-11 NOTE — Anesthesia Preprocedure Evaluation (Signed)
Anesthesia Evaluation  Patient identified by MRN, date of birth, ID band Patient awake    Reviewed: Allergy & Precautions, NPO status , Patient's Chart, lab work & pertinent test results  Airway Mallampati: Intubated  TM Distance: >3 FB Neck ROM: Full    Dental no notable dental hx.    Pulmonary asthma ,    Pulmonary exam normal        Cardiovascular hypertension, Pt. on medications Normal cardiovascular exam     Neuro/Psych negative neurological ROS  negative psych ROS   GI/Hepatic Neg liver ROS, GERD  Controlled,  Endo/Other  negative endocrine ROS  Renal/GU negative Renal ROS  negative genitourinary   Musculoskeletal negative musculoskeletal ROS (+)   Abdominal   Peds  Hematology   Anesthesia Other Findings   Reproductive/Obstetrics                             Anesthesia Physical Anesthesia Plan  ASA: II  Anesthesia Plan: MAC   Post-op Pain Management:    Induction: Intravenous  Airway Management Planned:   Additional Equipment:   Intra-op Plan:   Post-operative Plan:   Informed Consent: I have reviewed the patients History and Physical, chart, labs and discussed the procedure including the risks, benefits and alternatives for the proposed anesthesia with the patient or authorized representative who has indicated his/her understanding and acceptance.     Plan Discussed with: CRNA  Anesthesia Plan Comments:         Anesthesia Quick Evaluation

## 2015-10-11 NOTE — Op Note (Signed)
[facility] Patient Name: Dawn Russell Procedure Date: 10/11/2015 7:38 AM MRN: [id number] Account #: [id number] Date of Birth: [birth year] Admit Type: Outpatient Age: 50 Room: [facility] OR ROOM 01 Gender: Female Note Status: Finalized Procedure:            Colonoscopy Indications:          Screening for colorectal malignant neoplasm Providers:            Lucilla Lame, MD Referring MD:         Deborra Medina, MD (Referring MD) Medicines:            Propofol per Anesthesia Complications:        No immediate complications. Procedure:            Pre-Anesthesia Assessment:                       - Prior to the procedure, a History and Physical was                        performed, and patient medications and allergies were                        reviewed. The patient's tolerance of previous                        anesthesia was also reviewed. The risks and benefits of                        the procedure and the sedation options and risks were                        discussed with the patient. All questions were                        answered, and informed consent was obtained. Prior                        Anticoagulants: The patient has taken no previous                        anticoagulant or antiplatelet agents. ASA Grade                        Assessment: II - A patient with mild systemic disease.                        After reviewing the risks and benefits, the patient was                        deemed in satisfactory condition to undergo the                        procedure.                       After obtaining informed consent, the colonoscope was                        passed under direct vision. Throughout the procedure,  the patient's blood pressure, pulse, and oxygen                        saturations were monitored continuously. The Olympus                        CF-HQ190L Colonoscope (S#. [id number]) was introduced             through the anus and advanced to the the cecum,                        identified by appendiceal orifice and ileocecal valve.                        The colonoscopy was performed without difficulty. The                        patient tolerated the procedure well. The quality of                        the bowel preparation was excellent. Findings:      The perianal and digital rectal examinations were normal.      A 7 mm polyp was found in the sigmoid colon. The polyp was pedunculated.       The polyp was removed with a cold snare. Resection and retrieval were       complete.      Non-bleeding internal hemorrhoids were found during retroflexion. The       hemorrhoids were Grade II (internal hemorrhoids that prolapse but reduce       spontaneously). Impression:           - One 7 mm polyp in the sigmoid colon, removed with a                        cold snare. Resected and retrieved.                       - Non-bleeding internal hemorrhoids. Recommendation:       - Await pathology results.                       - Repeat colonoscopy in 5 years if polyp adenoma and 10                        years if hyperplastic Procedure Code(s):    --- Professional ---                       [phone], Colonoscopy, flexible; with removal of tumor(s),                        polyp(s), or other lesion(s) by snare technique Diagnosis Code(s):    --- Professional ---                       Z12.11, Encounter for screening for malignant neoplasm                        of colon                       D12.5, Benign neoplasm  of sigmoid colon CPT copyright 2016 American Medical Association. All rights reserved. The codes documented in this report are preliminary and upon coder review may  be revised to meet current compliance requirements. Lucilla Lame, MD 10/11/2015 7:58:44 AM This report has been signed electronically. Number of Addenda: 0 Note Initiated On: 10/11/2015 7:38 AM Scope Withdrawal Time: 0 hours 6  minutes 28 seconds  Total Procedure Duration: 0 hours 9 minutes 4 seconds       [facility]

## 2015-10-11 NOTE — Anesthesia Procedure Notes (Signed)
Procedure Name: MAC Performed by: Latanya Hemmer Pre-anesthesia Checklist: Patient identified, Emergency Drugs available, Suction available, Patient being monitored and Timeout performed Patient Re-evaluated:Patient Re-evaluated prior to inductionOxygen Delivery Method: Nasal cannula Preoxygenation: Pre-oxygenation with 100% oxygen       

## 2015-10-14 ENCOUNTER — Encounter: Payer: Self-pay | Admitting: Gastroenterology

## 2015-10-15 ENCOUNTER — Encounter: Payer: Self-pay | Admitting: Gastroenterology

## 2015-10-21 ENCOUNTER — Other Ambulatory Visit: Payer: Self-pay | Admitting: Nurse Practitioner

## 2015-10-21 NOTE — Telephone Encounter (Signed)
Losartan refill request.  Formerly [facility] patient.

## 2015-11-27 DIAGNOSIS — Z01419 Encounter for gynecological examination (general) (routine) without abnormal findings: Secondary | ICD-10-CM | POA: Diagnosis not present

## 2015-11-27 DIAGNOSIS — Z1231 Encounter for screening mammogram for malignant neoplasm of breast: Secondary | ICD-10-CM | POA: Diagnosis not present

## 2015-11-27 DIAGNOSIS — C569 Malignant neoplasm of unspecified ovary: Secondary | ICD-10-CM | POA: Diagnosis not present

## 2015-11-27 DIAGNOSIS — Z124 Encounter for screening for malignant neoplasm of cervix: Secondary | ICD-10-CM | POA: Diagnosis not present

## 2015-11-27 DIAGNOSIS — R87610 Atypical squamous cells of undetermined significance on cytologic smear of cervix (ASC-US): Secondary | ICD-10-CM | POA: Diagnosis not present

## 2015-11-27 LAB — HM MAMMOGRAPHY

## 2016-02-18 ENCOUNTER — Other Ambulatory Visit: Payer: Self-pay

## 2016-02-18 MED ORDER — LOSARTAN POTASSIUM 100 MG PO TABS: 100.0000 mg | ORAL_TABLET | Freq: Every day | ORAL | 0 refills | Status: DC

## 2016-04-15 ENCOUNTER — Ambulatory Visit (INDEPENDENT_AMBULATORY_CARE_PROVIDER_SITE_OTHER): Payer: BLUE CROSS/BLUE SHIELD

## 2016-04-15 DIAGNOSIS — Z23 Encounter for immunization: Secondary | ICD-10-CM

## 2016-05-18 ENCOUNTER — Telehealth: Payer: Self-pay | Admitting: Internal Medicine

## 2016-05-18 DIAGNOSIS — R748 Abnormal levels of other serum enzymes: Secondary | ICD-10-CM

## 2016-05-18 DIAGNOSIS — E663 Overweight: Secondary | ICD-10-CM

## 2016-05-18 DIAGNOSIS — I10 Essential (primary) hypertension: Secondary | ICD-10-CM

## 2016-05-20 MED ORDER — LOSARTAN POTASSIUM 100 MG PO TABS: 100.0000 mg | ORAL_TABLET | Freq: Every day | ORAL | 0 refills | Status: DC

## 2016-05-20 NOTE — Telephone Encounter (Signed)
Med refilled. Labs signed thanks!

## 2016-05-20 NOTE — Telephone Encounter (Signed)
Scheduled patient lab next week, appointment in January can I refill losartan? I pended labs.

## 2016-05-20 NOTE — Telephone Encounter (Signed)
Pt called to follow up on the medication refill. Please advise? 90 day supply  Pharmacy is [facility], [place]  Call pt @ [phone]. Thank you!

## 2016-05-20 NOTE — Addendum Note (Signed)
Addended by: Nanci Pina on: 05/20/2016 05:05 PM   Modules accepted: Orders

## 2016-05-20 NOTE — Addendum Note (Signed)
Addended by: Crecencio Mc on: 05/20/2016 05:19 PM   Modules accepted: Orders

## 2016-05-21 NOTE — Telephone Encounter (Signed)
Patient notified and voiced understanding.

## 2016-05-26 ENCOUNTER — Other Ambulatory Visit (INDEPENDENT_AMBULATORY_CARE_PROVIDER_SITE_OTHER): Payer: BLUE CROSS/BLUE SHIELD

## 2016-05-26 DIAGNOSIS — R748 Abnormal levels of other serum enzymes: Secondary | ICD-10-CM

## 2016-05-26 DIAGNOSIS — I10 Essential (primary) hypertension: Secondary | ICD-10-CM

## 2016-05-26 DIAGNOSIS — E663 Overweight: Secondary | ICD-10-CM | POA: Diagnosis not present

## 2016-05-26 LAB — COMPREHENSIVE METABOLIC PANEL
ALBUMIN: 4.5 g/dL (ref 3.5–5.2)
ALT: 22 U/L (ref 0–35)
AST: 15 U/L (ref 0–37)
Alkaline Phosphatase: 81 U/L (ref 39–117)
BUN: 10 mg/dL (ref 6–23)
CALCIUM: 9.7 mg/dL (ref 8.4–10.5)
CHLORIDE: 104 meq/L (ref 96–112)
CO2: 28 meq/L (ref 19–32)
CREATININE: 0.62 mg/dL (ref 0.40–1.20)
GFR: 107.7 mL/min (ref >60.00)
Glucose, Bld: 110 mg/dL — ABNORMAL HIGH (ref 70–99)
POTASSIUM: 4.3 meq/L (ref 3.5–5.1)
SODIUM: 140 meq/L (ref 135–145)
Total Bilirubin: 0.4 mg/dL (ref 0.2–1.2)
Total Protein: 7.1 g/dL (ref 6.0–8.3)

## 2016-05-26 LAB — LIPID PANEL
CHOL/HDL RATIO: 3
Cholesterol: 174 mg/dL (ref 0–200)
HDL: 60.3 mg/dL (ref >39.00)
LDL CALC: 96 mg/dL (ref 0–99)
NONHDL: 114.14
Triglycerides: 92 mg/dL (ref 0.0–149.0)
VLDL: 18.4 mg/dL (ref 0.0–40.0)

## 2016-05-26 LAB — CBC WITH DIFFERENTIAL/PLATELET
BASOS PCT: 0.5 % (ref 0.0–3.0)
Basophils Absolute: 0.1 10*3/uL (ref 0.0–0.1)
Eosinophils Absolute: 0.4 10*3/uL (ref 0.0–0.7)
Eosinophils Relative: 3.1 % (ref 0.0–5.0)
HEMATOCRIT: 41.4 % (ref 36.0–46.0)
HEMOGLOBIN: 13.9 g/dL (ref 12.0–15.0)
LYMPHS PCT: 19.4 % (ref 12.0–46.0)
Lymphs Abs: 2.2 10*3/uL (ref 0.7–4.0)
MCHC: 33.6 g/dL (ref 30.0–36.0)
MCV: 81.3 fl (ref 78.0–100.0)
MONOS PCT: 4 % (ref 3.0–12.0)
Monocytes Absolute: 0.5 10*3/uL (ref 0.1–1.0)
Neutro Abs: 8.3 10*3/uL — ABNORMAL HIGH (ref 1.4–7.7)
Neutrophils Relative %: 73 % (ref 43.0–77.0)
PLATELETS: 305 10*3/uL (ref 150.0–400.0)
RBC: 5.09 Mil/uL (ref 3.87–5.11)
RDW: 13.6 % (ref 11.5–15.5)
WBC: 11.4 10*3/uL — AB (ref 4.0–10.5)

## 2016-05-26 LAB — TSH: TSH: 1.13 u[IU]/mL (ref 0.35–4.50)

## 2016-06-15 ENCOUNTER — Encounter: Payer: Self-pay | Admitting: Internal Medicine

## 2016-06-15 ENCOUNTER — Ambulatory Visit (INDEPENDENT_AMBULATORY_CARE_PROVIDER_SITE_OTHER): Payer: BLUE CROSS/BLUE SHIELD | Admitting: Internal Medicine

## 2016-06-15 VITALS — BP 128/82 | HR 89 | Temp 98.0°F | Resp 16 | Ht 67.0 in | Wt 170.2 lb

## 2016-06-15 DIAGNOSIS — R7301 Impaired fasting glucose: Secondary | ICD-10-CM

## 2016-06-15 DIAGNOSIS — D126 Benign neoplasm of colon, unspecified: Secondary | ICD-10-CM

## 2016-06-15 DIAGNOSIS — L989 Disorder of the skin and subcutaneous tissue, unspecified: Secondary | ICD-10-CM | POA: Diagnosis not present

## 2016-06-15 DIAGNOSIS — R7303 Prediabetes: Secondary | ICD-10-CM

## 2016-06-15 DIAGNOSIS — J4520 Mild intermittent asthma, uncomplicated: Secondary | ICD-10-CM

## 2016-06-15 DIAGNOSIS — E559 Vitamin D deficiency, unspecified: Secondary | ICD-10-CM | POA: Diagnosis not present

## 2016-06-15 DIAGNOSIS — I10 Essential (primary) hypertension: Secondary | ICD-10-CM | POA: Diagnosis not present

## 2016-06-15 DIAGNOSIS — G4762 Sleep related leg cramps: Secondary | ICD-10-CM

## 2016-06-15 MED ORDER — TRIAMCINOLONE ACETONIDE 0.1 % EX CREA: 1.0000 "application " | TOPICAL_CREAM | Freq: Two times a day (BID) | CUTANEOUS | 0 refills | Status: DC

## 2016-06-15 NOTE — Progress Notes (Addendum)
Subjective:  Patient ID: Dawn Russell, female    DOB: [birth year]  Age: 51 y.o. MRN: [id number]  CC: The primary encounter diagnosis was Impaired fasting glucose. Diagnoses of Vitamin D deficiency, Skin lesion of face, Essential hypertension, Skin lesions, Tubular adenoma of colon, Nocturnal leg cramps, Mild intermittent asthma without complication, and Prediabetes were also pertinent to this visit.  HPI Dawn Russell presents for follow up on chronic conditions including asthma,  Hypertension and overweight,  Last seen 2 years ago.  she has lost 5 lbs since her last visit . 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 Raphael Gibney annually for cervical ca screening and breast cancer screening.  Colonoscopy May 2017  Tubular adenoma.  5 yt follow up [facility]   Legs cramping at night  2 blemishes/spots on her face   One itch quarter sized rash on suprapubic area.  Has tried  Lotions ,all nonsteroidal   Outpatient Medications Prior to Visit  Medication Sig Dispense Refill  . cetirizine (ZYRTEC) 10 MG tablet Take 10 mg by mouth daily as needed. Reported on 08/23/2015    . losartan (COZAAR) 100 MG tablet Take 1 tablet (100 mg total) by mouth daily. 90 tablet 0  . VENTOLIN HFA 108 (90 BASE) MCG/ACT inhaler INHALE TWO PUFFS 4 TIMES DAILY AS NEEDED FOR  WHEEZING 18 g PRN   No facility-administered medications prior to visit.     Review of Systems;  Patient denies headache, fevers, malaise, unintentional weight loss, skin rash, eye pain, sinus congestion and sinus pain, sore throat, dysphagia,  hemoptysis , cough, dyspnea, wheezing, chest pain, palpitations, orthopnea, edema, abdominal pain, nausea, melena, diarrhea, constipation, flank pain, dysuria, hematuria, urinary  Frequency, nocturia, numbness, tingling, seizures,  Focal weakness, Loss of consciousness,  Tremor, insomnia, depression, anxiety, and suicidal ideation.      Objective:  BP 128/82   Pulse 89   Temp  98 F (36.7 C) (Oral)   Resp 16   Ht 5\' 7"  (1.702 m)   Wt 170 lb 4 oz (77.2 kg)   LMP 05/31/2016   SpO2 99%   BMI 26.66 kg/m   BP Readings from Last 3 Encounters:  06/15/16 128/82  10/11/15 114/62  04/29/15 120/80    Wt Readings from Last 3 Encounters:  06/15/16 170 lb 4 oz (77.2 kg)  10/11/15 170 lb (77.1 kg)  04/29/15 175 lb (79.4 kg)    General appearance: alert, cooperative and appears stated age Ears: normal TM's and external ear canals both ears Throat: lips, mucosa, and tongue normal; teeth and gums normal Neck: no adenopathy, no carotid bruit, supple, symmetrical, trachea midline and thyroid not enlarged, symmetric, no tenderness/mass/nodules Back: symmetric, no curvature. ROM normal. No CVA tenderness. Lungs: clear to auscultation bilaterally Heart: regular rate and rhythm, S1, S2 normal, no murmur, click, rub or gallop Abdomen: soft, non-tender; bowel sounds normal; no masses,  no organomegaly Pulses: 2+ and symmetric Skin: Skin color, texture, turgor normal. No rashes or lesions Lymph nodes: Cervical, supraclavicular, and axillary nodes normal.  Lab Results  Component Value Date   HGBA1C 6.1 06/15/2016    Lab Results  Component Value Date   CREATININE 0.62 05/26/2016   CREATININE 0.6 01/23/2014   CREATININE 0.6 12/14/2012    Lab Results  Component Value Date   WBC 11.4 (H) 05/26/2016   HGB 13.9 05/26/2016   HCT 41.4 05/26/2016   PLT 305.0 05/26/2016   GLUCOSE 110 (H) 05/26/2016   CHOL  174 05/26/2016   TRIG 92.0 05/26/2016   HDL 60.30 05/26/2016   LDLDIRECT 141.8 12/14/2012   LDLCALC 96 05/26/2016   ALT 22 05/26/2016   AST 15 05/26/2016   NA 140 05/26/2016   K 4.3 05/26/2016   CL 104 05/26/2016   CREATININE 0.62 05/26/2016   BUN 10 05/26/2016   CO2 28 05/26/2016   TSH 1.13 05/26/2016   HGBA1C 6.1 06/15/2016    No results found.  Assessment & Plan:   Problem List Items Addressed This Visit    Asthma    Mild, intermittent.  No  inidicationfor daily use of LABA or inhaled steroids.       Hypertension    Well controlled on current regimen. Renal function stable, no changes today.  Lab Results  Component Value Date   CREATININE 0.62 05/26/2016   Lab Results  Component Value Date   NA 140 05/26/2016   K 4.3 05/26/2016   CL 104 05/26/2016   CO2 28 05/26/2016         Nocturnal leg cramps    Recommending tonic water, turmeric and magnesium       Prediabetes - Primary    Prediabetes evidenced by a1c.  Low gi diet and regular exercise recommended.   Lab Results  Component Value Date   HGBA1C 6.1 06/15/2016         Skin lesions    Referral to Dr. Hinda Kehr for evaluation      Tubular adenoma of colon    by colonoscopy May 2017. 5 yr follow up recommended [facility])      Vitamin D deficiency    Level was 12. Drisdol prescribed x 4 months        Relevant Orders   VITAMIN D 25 Hydroxy (Vit-D Deficiency, Fractures) (Completed)    Other Visit Diagnoses    Skin lesion of face       Relevant Orders   Ambulatory referral to Dermatology     A total of 40 minutes was spent with patient more than half of which was spent in counseling patient on the above mentioned issues , reviewing and explaining recent labs and imaging studies done, and coordination of care.  I am having Ms. Gowans start on triamcinolone cream and ergocalciferol. I am also having her maintain her VENTOLIN HFA, cetirizine, and losartan.  Meds ordered this encounter  Medications  . triamcinolone cream (KENALOG) 0.1 %    Sig: Apply 1 application topically 2 (two) times daily.    Dispense:  30 g    Refill:  0  . ergocalciferol (DRISDOL) 50000 units capsule    Sig: Take 1 capsule (50,000 Units total) by mouth once a week.    Dispense:  4 capsule    Refill:  3    There are no discontinued medications.  Follow-up: No Follow-up on file.   Crecencio Mc, MD

## 2016-06-15 NOTE — Patient Instructions (Addendum)
check BP 5 times over the next 2 months and send me the readings  try drinking one ounce of tonic water or taking turmeric for leg cramps   Referral to dermatology is pending    Your fasting glucoses suggests you are at risk for developing Type 2 DM   . You might want to try a premixed protein drink called Premier Protein shake for breakfast or late night snack . It is great tasting,   very low sugar and available of < $2 serving at [facility] and  In bulk for $1.50/serving at [employer] and [employer]  .    Nutritional analysis :  160 cal  30 g protein  1 g sugar 50% calcium needs   [employer] and [employer]  Trade the granola bar for  KIND low glycemic index  (5 g sugar) bar for breakfast   [employer] now makes a frozen breakfast frittata that can be microwaved in 2 minutes and is very low carb. Frittats are similar to quiches without the crust

## 2016-06-16 DIAGNOSIS — L989 Disorder of the skin and subcutaneous tissue, unspecified: Secondary | ICD-10-CM | POA: Insufficient documentation

## 2016-06-16 DIAGNOSIS — G4762 Sleep related leg cramps: Secondary | ICD-10-CM | POA: Insufficient documentation

## 2016-06-16 DIAGNOSIS — R7303 Prediabetes: Secondary | ICD-10-CM | POA: Insufficient documentation

## 2016-06-16 LAB — VITAMIN D 25 HYDROXY (VIT D DEFICIENCY, FRACTURES): VITD: 12.72 ng/mL — AB (ref 30.00–100.00)

## 2016-06-16 LAB — HEMOGLOBIN A1C: Hgb A1c MFr Bld: 6.1 % (ref 4.6–6.5)

## 2016-06-16 NOTE — Assessment & Plan Note (Signed)
Referral to Dr. Hinda Kehr for evaluation

## 2016-06-16 NOTE — Assessment & Plan Note (Signed)
Well controlled on current regimen. Renal function stable, no changes today.  Lab Results  Component Value Date   CREATININE 0.62 05/26/2016   Lab Results  Component Value Date   NA 140 05/26/2016   K 4.3 05/26/2016   CL 104 05/26/2016   CO2 28 05/26/2016

## 2016-06-16 NOTE — Assessment & Plan Note (Signed)
Mild, intermittent.  No inidicationfor daily use of LABA or inhaled steroids.

## 2016-06-16 NOTE — Assessment & Plan Note (Signed)
Recommending tonic water, turmeric and magnesium

## 2016-06-16 NOTE — Assessment & Plan Note (Signed)
by colonoscopy May 2017. 5 yr follow up recommended [facility] Norris)

## 2016-06-16 NOTE — Assessment & Plan Note (Signed)
Prediabetes evidenced by a1c.  Low gi diet and regular exercise recommended.   Lab Results  Component Value Date   HGBA1C 6.1 06/15/2016

## 2016-06-17 DIAGNOSIS — E559 Vitamin D deficiency, unspecified: Secondary | ICD-10-CM | POA: Insufficient documentation

## 2016-06-17 MED ORDER — ERGOCALCIFEROL 1.25 MG (50000 UT) PO CAPS: 50000.0000 [IU] | ORAL_CAPSULE | ORAL | 3 refills | Status: DC

## 2016-06-17 NOTE — Addendum Note (Signed)
Addended by: Crecencio Mc on: 06/17/2016 12:55 PM   Modules accepted: Orders

## 2016-06-17 NOTE — Assessment & Plan Note (Signed)
Level was 12. Drisdol prescribed x 4 months

## 2016-06-19 ENCOUNTER — Encounter: Payer: Self-pay | Admitting: Internal Medicine

## 2016-06-23 DIAGNOSIS — L720 Epidermal cyst: Secondary | ICD-10-CM | POA: Diagnosis not present

## 2016-06-23 DIAGNOSIS — B351 Tinea unguium: Secondary | ICD-10-CM | POA: Diagnosis not present

## 2016-06-23 DIAGNOSIS — L918 Other hypertrophic disorders of the skin: Secondary | ICD-10-CM | POA: Diagnosis not present

## 2016-06-23 DIAGNOSIS — L738 Other specified follicular disorders: Secondary | ICD-10-CM | POA: Diagnosis not present

## 2016-07-13 ENCOUNTER — Encounter: Payer: Self-pay | Admitting: Internal Medicine

## 2016-08-14 ENCOUNTER — Other Ambulatory Visit: Payer: Self-pay | Admitting: Internal Medicine

## 2016-11-26 ENCOUNTER — Encounter: Payer: Self-pay | Admitting: Internal Medicine

## 2016-11-26 ENCOUNTER — Ambulatory Visit (INDEPENDENT_AMBULATORY_CARE_PROVIDER_SITE_OTHER): Payer: BLUE CROSS/BLUE SHIELD | Admitting: Internal Medicine

## 2016-11-26 VITALS — BP 100/70 | HR 74 | Temp 98.1°F | Resp 16 | Ht 67.0 in | Wt 150.4 lb

## 2016-11-26 DIAGNOSIS — E663 Overweight: Secondary | ICD-10-CM

## 2016-11-26 DIAGNOSIS — E559 Vitamin D deficiency, unspecified: Secondary | ICD-10-CM | POA: Diagnosis not present

## 2016-11-26 DIAGNOSIS — K148 Other diseases of tongue: Secondary | ICD-10-CM | POA: Diagnosis not present

## 2016-11-26 DIAGNOSIS — I10 Essential (primary) hypertension: Secondary | ICD-10-CM

## 2016-11-26 DIAGNOSIS — R5383 Other fatigue: Secondary | ICD-10-CM

## 2016-11-26 DIAGNOSIS — R7303 Prediabetes: Secondary | ICD-10-CM | POA: Diagnosis not present

## 2016-11-26 DIAGNOSIS — R7301 Impaired fasting glucose: Secondary | ICD-10-CM | POA: Diagnosis not present

## 2016-11-26 LAB — CBC WITH DIFFERENTIAL/PLATELET
Basophils Absolute: 0 10*3/uL (ref 0.0–0.1)
Basophils Relative: 0.6 % (ref 0.0–3.0)
EOS PCT: 1.8 % (ref 0.0–5.0)
Eosinophils Absolute: 0.2 10*3/uL (ref 0.0–0.7)
HEMATOCRIT: 42.2 % (ref 36.0–46.0)
HEMOGLOBIN: 13.8 g/dL (ref 12.0–15.0)
LYMPHS PCT: 19.9 % (ref 12.0–46.0)
Lymphs Abs: 1.7 10*3/uL (ref 0.7–4.0)
MCHC: 32.8 g/dL (ref 30.0–36.0)
MCV: 83.8 fl (ref 78.0–100.0)
MONOS PCT: 4.8 % (ref 3.0–12.0)
Monocytes Absolute: 0.4 10*3/uL (ref 0.1–1.0)
NEUTROS PCT: 72.9 % (ref 43.0–77.0)
Neutro Abs: 6.2 10*3/uL (ref 1.4–7.7)
Platelets: 262 10*3/uL (ref 150.0–400.0)
RBC: 5.03 Mil/uL (ref 3.87–5.11)
RDW: 14.2 % (ref 11.5–15.5)
WBC: 8.5 10*3/uL (ref 4.0–10.5)

## 2016-11-26 LAB — IRON AND TIBC
%SAT: 28 % (ref 11–50)
Iron: 81 ug/dL (ref 45–160)
TIBC: 294 ug/dL (ref 250–450)
UIBC: 213 ug/dL

## 2016-11-26 LAB — HEMOGLOBIN A1C: Hgb A1c MFr Bld: 5.9 % (ref 4.6–6.5)

## 2016-11-26 LAB — VITAMIN D 25 HYDROXY (VIT D DEFICIENCY, FRACTURES): VITD: 21.06 ng/mL — AB (ref 30.00–100.00)

## 2016-11-26 MED ORDER — LOSARTAN POTASSIUM 50 MG PO TABS: 50.0000 mg | ORAL_TABLET | Freq: Every day | ORAL | 1 refills | Status: DC

## 2016-11-26 NOTE — Patient Instructions (Addendum)
HOLY COW!!!  KUDOS!!!!!   Reduce your dose of losartan to 50 mg daily  Check bp periodically and let me know if readings are > 130/80 (goal is < 798/92)   If the metallic taste doesn't disappera, let me now and I will treat you for thrush

## 2016-11-26 NOTE — Progress Notes (Signed)
Subjective:  Patient ID: Dawn Russell, female    DOB: 1966-04-27  Age: 51 y.o. MRN: [id number]  CC: The primary encounter diagnosis was Impaired fasting glucose. Diagnoses of Vitamin D deficiency, Fatigue, unspecified type, Overweight (BMI 25.0-29.9), Prediabetes, Tongue discoloration, and Essential hypertension were also pertinent to this visit.  HPI Dawn Russell presents forfollow up on IPG,  Vit D deficiency.  Has developed a metallic taste in mouth. No sinus symptoms, no sore thraot, no recent abx or steroids.    Weight loss of 20 lbs since January Body mass index is 23.56 kg/m. following a low GI diet , not exercising regularly   Lab Results  Component Value Date   HGBA1C 5.9 11/26/2016       Outpatient Medications Prior to Visit  Medication Sig Dispense Refill  . triamcinolone cream (KENALOG) 0.1 % Apply 1 application topically 2 (two) times daily. 30 g 0  . VENTOLIN HFA 108 (90 BASE) MCG/ACT inhaler INHALE TWO PUFFS 4 TIMES DAILY AS NEEDED FOR  WHEEZING 18 g PRN  . losartan (COZAAR) 100 MG tablet TAKE 1 TABLET DAILY 90 tablet 1  . cetirizine (ZYRTEC) 10 MG tablet Take 10 mg by mouth daily as needed. Reported on 08/23/2015    . ergocalciferol (DRISDOL) 50000 units capsule Take 1 capsule (50,000 Units total) by mouth once a week. (Patient not taking: Reported on 11/26/2016) 4 capsule 3   No facility-administered medications prior to visit.     Review of Systems;  Patient denies headache, fevers, malaise, unintentional weight loss, skin rash, eye pain, sinus congestion and sinus pain, sore throat, dysphagia,  hemoptysis , cough, dyspnea, wheezing, chest pain, palpitations, orthopnea, edema, abdominal pain, nausea, melena, diarrhea, constipation, flank pain, dysuria, hematuria, urinary  Frequency, nocturia, numbness, tingling, seizures,  Focal weakness, Loss of consciousness,  Tremor, insomnia, depression, anxiety, and suicidal ideation.      Objective:  BP 100/70 (BP  Location: Left Arm, Patient Position: Sitting, Cuff Size: Normal)   Pulse 74   Temp 98.1 F (36.7 C) (Oral)   Resp 16   Ht 5\' 7"  (1.702 m)   Wt 150 lb 6.4 oz (68.2 kg)   SpO2 98%   BMI 23.56 kg/m   BP Readings from Last 3 Encounters:  11/26/16 100/70  06/15/16 128/82  10/11/15 114/62    Wt Readings from Last 3 Encounters:  11/26/16 150 lb 6.4 oz (68.2 kg)  06/15/16 170 lb 4 oz (77.2 kg)  10/11/15 170 lb (77.1 kg)    General appearance: alert, cooperative and appears stated age Ears: normal TM's and external ear canals both ears Throat: lips, mucosa normal. Tongue has a thick white coating ; teeth and gums normal Neck: no adenopathy, no carotid bruit, supple, symmetrical, trachea midline and thyroid not enlarged, symmetric, no tenderness/mass/nodules Back: symmetric, no curvature. ROM normal. No CVA tenderness. Lungs: clear to auscultation bilaterally Heart: regular rate and rhythm, S1, S2 normal, no murmur, click, rub or gallop Abdomen: soft, non-tender; bowel sounds normal; no masses,  no organomegaly Pulses: 2+ and symmetric Skin: Skin color, texture, turgor normal. No rashes or lesions Lymph nodes: Cervical, supraclavicular, and axillary nodes normal.  Lab Results  Component Value Date   HGBA1C 5.9 11/26/2016   HGBA1C 6.1 06/15/2016    Lab Results  Component Value Date   CREATININE 0.62 05/26/2016   CREATININE 0.6 01/23/2014   CREATININE 0.6 12/14/2012    Lab Results  Component Value Date   WBC 8.5 11/26/2016   HGB  13.8 11/26/2016   HCT 42.2 11/26/2016   PLT 262.0 11/26/2016   GLUCOSE 110 (H) 05/26/2016   CHOL 174 05/26/2016   TRIG 92.0 05/26/2016   HDL 60.30 05/26/2016   LDLDIRECT 141.8 12/14/2012   LDLCALC 96 05/26/2016   ALT 22 05/26/2016   AST 15 05/26/2016   NA 140 05/26/2016   K 4.3 05/26/2016   CL 104 05/26/2016   CREATININE 0.62 05/26/2016   BUN 10 05/26/2016   CO2 28 05/26/2016   TSH 1.13 05/26/2016   HGBA1C 5.9 11/26/2016    No  results found.  Assessment & Plan:   Problem List Items Addressed This Visit    Vitamin D deficiency   Relevant Orders   VITAMIN D 25 Hydroxy (Vit-D Deficiency, Fractures) (Completed)   Tongue discoloration    Given her altered sense of taste, she may .  will treat if does not resolve.       Prediabetes    A1c has dropped to 54.9 with weight loss and low carb diet.    Lab Results  Component Value Date   HGBA1C 5.9 11/26/2016         Overweight (BMI 25.0-29.9)    I have congratulated her in reduction of   BMI and encouraged  Continued weight loss with goal of 10% of body weigh over the next 6 months using a low glycemic index diet and regular exercise a minimum of 5 days per week.        Hypertension    Well controlled on current regimen. Renal function stable.  disucssed reducing dose of losoartan to 50 mg , no changes today.  Lab Results  Component Value Date   CREATININE 0.62 05/26/2016         Relevant Medications   losartan (COZAAR) 50 MG tablet    Other Visit Diagnoses    Impaired fasting glucose    -  Primary   Relevant Orders   Hemoglobin A1c (Completed)   Fatigue, unspecified type       Relevant Orders   Iron and TIBC (Completed)   CBC with Differential/Platelet (Completed)     A total of 40 minutes of face to face time was spent with patient more than half of which was spent in counselling and coordination of care  I have discontinued Ms. Beam's cetirizine and ergocalciferol. I have also changed her losartan. Additionally, I am having her maintain her VENTOLIN HFA, triamcinolone cream, and Vitamin D3.  Meds ordered this encounter  Medications  . Cholecalciferol (VITAMIN D3) 1000 units CAPS    Sig: Take 1,000 Units by mouth.  . losartan (COZAAR) 50 MG tablet    Sig: Take 1 tablet (50 mg total) by mouth daily.    Dispense:  90 tablet    Refill:  1    Medications Discontinued During This Encounter  Medication Reason  . cetirizine (ZYRTEC) 10 MG  tablet Patient has not taken in last 30 days  . ergocalciferol (DRISDOL) 50000 units capsule Patient has not taken in last 30 days  . losartan (COZAAR) 100 MG tablet Reorder    Follow-up: Return in about 6 months (around 05/28/2017).   Crecencio Mc, MD

## 2016-11-28 DIAGNOSIS — K148 Other diseases of tongue: Secondary | ICD-10-CM | POA: Insufficient documentation

## 2016-11-28 NOTE — Assessment & Plan Note (Signed)
Well controlled on current regimen. Renal function stable.  disucssed reducing dose of losoartan to 50 mg , no changes today.  Lab Results  Component Value Date   CREATININE 0.62 05/26/2016

## 2016-11-28 NOTE — Assessment & Plan Note (Signed)
A1c has dropped to 54.9 with weight loss and low carb diet.    Lab Results  Component Value Date   HGBA1C 5.9 11/26/2016

## 2016-11-28 NOTE — Assessment & Plan Note (Signed)
Given her altered sense of taste, she may .  will treat if does not resolve.

## 2016-11-28 NOTE — Assessment & Plan Note (Signed)
I have congratulated her in reduction of   BMI and encouraged  Continued weight loss with goal of 10% of body weigh over the next 6 months using a low glycemic index diet and regular exercise a minimum of 5 days per week.    

## 2016-12-01 ENCOUNTER — Other Ambulatory Visit: Payer: Self-pay | Admitting: Internal Medicine

## 2016-12-01 MED ORDER — ERGOCALCIFEROL 1.25 MG (50000 UT) PO CAPS: 50000.0000 [IU] | ORAL_CAPSULE | ORAL | 0 refills | Status: DC

## 2016-12-01 MED ORDER — NYSTATIN 100000 UNIT/ML MT SUSP: 5.0000 mL | Freq: Four times a day (QID) | OROMUCOSAL | 0 refills | Status: DC

## 2016-12-15 ENCOUNTER — Ambulatory Visit: Payer: BLUE CROSS/BLUE SHIELD | Admitting: Internal Medicine

## 2016-12-24 DIAGNOSIS — C569 Malignant neoplasm of unspecified ovary: Secondary | ICD-10-CM | POA: Diagnosis not present

## 2016-12-24 DIAGNOSIS — Z6824 Body mass index (BMI) 24.0-24.9, adult: Secondary | ICD-10-CM | POA: Diagnosis not present

## 2016-12-24 DIAGNOSIS — Z124 Encounter for screening for malignant neoplasm of cervix: Secondary | ICD-10-CM | POA: Diagnosis not present

## 2016-12-24 DIAGNOSIS — Z01419 Encounter for gynecological examination (general) (routine) without abnormal findings: Secondary | ICD-10-CM | POA: Diagnosis not present

## 2016-12-24 DIAGNOSIS — Z1231 Encounter for screening mammogram for malignant neoplasm of breast: Secondary | ICD-10-CM | POA: Diagnosis not present

## 2016-12-24 LAB — HM PAP SMEAR

## 2016-12-25 DIAGNOSIS — Z124 Encounter for screening for malignant neoplasm of cervix: Secondary | ICD-10-CM | POA: Diagnosis not present

## 2016-12-25 DIAGNOSIS — R87618 Other abnormal cytological findings on specimens from cervix uteri: Secondary | ICD-10-CM | POA: Diagnosis not present

## 2016-12-25 DIAGNOSIS — C569 Malignant neoplasm of unspecified ovary: Secondary | ICD-10-CM | POA: Diagnosis not present

## 2016-12-28 ENCOUNTER — Other Ambulatory Visit: Payer: Self-pay

## 2017-03-02 ENCOUNTER — Other Ambulatory Visit (INDEPENDENT_AMBULATORY_CARE_PROVIDER_SITE_OTHER): Payer: BLUE CROSS/BLUE SHIELD

## 2017-03-02 ENCOUNTER — Telehealth: Payer: Self-pay | Admitting: *Deleted

## 2017-03-02 DIAGNOSIS — R300 Dysuria: Secondary | ICD-10-CM

## 2017-03-02 LAB — URINALYSIS, MICROSCOPIC ONLY

## 2017-03-02 LAB — POCT URINALYSIS DIPSTICK
Bilirubin, UA: NEGATIVE
Glucose, UA: NEGATIVE
KETONES UA: NEGATIVE
Nitrite, UA: NEGATIVE
Protein, UA: 100
SPEC GRAV UA: 1.025 (ref 1.010–1.025)
UROBILINOGEN UA: 0.2 U/dL
pH, UA: 5.5 (ref 5.0–8.0)

## 2017-03-02 MED ORDER — CIPROFLOXACIN HCL 250 MG PO TABS: 250.0000 mg | ORAL_TABLET | Freq: Two times a day (BID) | ORAL | 0 refills | Status: DC

## 2017-03-02 NOTE — Telephone Encounter (Signed)
Pt currently has frequency in urination and light lower abdominal pain . Pt would like to come in for a urine sample to test for a Uti. Please advise  Pt contact [phone]

## 2017-03-02 NOTE — Telephone Encounter (Signed)
Patient called complaining of possible UTI. She is having frequency, pressure, light abdominal pain. Symptoms started Friday. Denies fever, nausea, vomit, or visible blood in urine. Patient is wondering if she can come in and give urine sample or if she needs to be evaluated. Please advise.

## 2017-03-02 NOTE — Telephone Encounter (Signed)
Patient has been scheduled for urine

## 2017-03-02 NOTE — Telephone Encounter (Signed)
LMTCB

## 2017-03-02 NOTE — Telephone Encounter (Signed)
Needs to drop off urine.  I will call in medications but needs to submit urine first  Labs ordered

## 2017-03-05 LAB — URINE CULTURE
MICRO NUMBER: 81060652
SPECIMEN QUALITY:: ADEQUATE

## 2017-05-10 ENCOUNTER — Other Ambulatory Visit: Payer: Self-pay | Admitting: Internal Medicine

## 2017-05-28 ENCOUNTER — Ambulatory Visit: Payer: BLUE CROSS/BLUE SHIELD | Admitting: Internal Medicine

## 2017-05-28 ENCOUNTER — Encounter: Payer: Self-pay | Admitting: Internal Medicine

## 2017-05-28 VITALS — BP 118/74 | HR 80 | Temp 97.9°F | Resp 15 | Ht 67.0 in | Wt 148.4 lb

## 2017-05-28 DIAGNOSIS — E663 Overweight: Secondary | ICD-10-CM

## 2017-05-28 DIAGNOSIS — K047 Periapical abscess without sinus: Secondary | ICD-10-CM

## 2017-05-28 DIAGNOSIS — R7303 Prediabetes: Secondary | ICD-10-CM | POA: Diagnosis not present

## 2017-05-28 DIAGNOSIS — I10 Essential (primary) hypertension: Secondary | ICD-10-CM

## 2017-05-28 LAB — COMPREHENSIVE METABOLIC PANEL
ALK PHOS: 97 U/L (ref 39–117)
ALT: 42 U/L — AB (ref 0–35)
AST: 21 U/L (ref 0–37)
Albumin: 4.1 g/dL (ref 3.5–5.2)
BUN: 15 mg/dL (ref 6–23)
CO2: 29 mEq/L (ref 19–32)
Calcium: 9.4 mg/dL (ref 8.4–10.5)
Chloride: 103 mEq/L (ref 96–112)
Creatinine, Ser: 0.54 mg/dL (ref 0.40–1.20)
GFR: 125.82 mL/min (ref >60.00)
GLUCOSE: 77 mg/dL (ref 70–99)
POTASSIUM: 3.8 meq/L (ref 3.5–5.1)
SODIUM: 139 meq/L (ref 135–145)
TOTAL PROTEIN: 7 g/dL (ref 6.0–8.3)
Total Bilirubin: 0.4 mg/dL (ref 0.2–1.2)

## 2017-05-28 LAB — CBC WITH DIFFERENTIAL/PLATELET
BASOS ABS: 0 10*3/uL (ref 0.0–0.1)
Basophils Relative: 0.4 % (ref 0.0–3.0)
EOS ABS: 0.2 10*3/uL (ref 0.0–0.7)
Eosinophils Relative: 2.7 % (ref 0.0–5.0)
HCT: 41.6 % (ref 36.0–46.0)
HEMOGLOBIN: 13.8 g/dL (ref 12.0–15.0)
Lymphocytes Relative: 22.5 % (ref 12.0–46.0)
Lymphs Abs: 1.9 10*3/uL (ref 0.7–4.0)
MCHC: 33.1 g/dL (ref 30.0–36.0)
MCV: 84.8 fl (ref 78.0–100.0)
MONO ABS: 0.5 10*3/uL (ref 0.1–1.0)
Monocytes Relative: 6.2 % (ref 3.0–12.0)
Neutro Abs: 5.8 10*3/uL (ref 1.4–7.7)
Neutrophils Relative %: 68.2 % (ref 43.0–77.0)
Platelets: 260 10*3/uL (ref 150.0–400.0)
RBC: 4.9 Mil/uL (ref 3.87–5.11)
RDW: 13.8 % (ref 11.5–15.5)
WBC: 8.6 10*3/uL (ref 4.0–10.5)

## 2017-05-28 LAB — HEMOGLOBIN A1C: Hgb A1c MFr Bld: 5.8 % (ref 4.6–6.5)

## 2017-05-28 LAB — C-REACTIVE PROTEIN: CRP: 0.4 mg/dL — AB (ref 0.5–20.0)

## 2017-05-28 LAB — SEDIMENTATION RATE: SED RATE: 19 mm/h (ref 0–30)

## 2017-05-28 NOTE — Progress Notes (Signed)
Subjective:  Patient ID: Dawn Russell, female    DOB: 1965/12/20  Age: 51 y.o. MRN: [id number]  CC: The primary encounter diagnosis was Abscessed tooth. Diagnoses of Prediabetes, Overweight (BMI 25.0-29.9), Essential hypertension, and Dental abscess were also pertinent to this visit.  HPI Dawn Russell presents for 6 month follow up on IPG/prediabetes and overweight.  Patient has no complaints today.  Patient is following a low glycemic index diet and exercising regularly   Developed jaw pain,  Found to have a  tooth abscess which created a bone defect in her mandible on the right  last week,  Underwent 1/2 root canal done by [facility] .  Has to have the other half done next week.   TAKING A PROBIOTIC .  Had 8 days of antibiotics,  Finished them on Monday .  Still having pain in the area,  Worried that she needs more antibiotics.   Outpatient Medications Prior to Visit  Medication Sig Dispense Refill  . Cholecalciferol (VITAMIN D3) 1000 units CAPS Take 1,000 Units by mouth.    . losartan (COZAAR) 50 MG tablet TAKE 1 TABLET DAILY 90 tablet 1  . VENTOLIN HFA 108 (90 BASE) MCG/ACT inhaler INHALE TWO PUFFS 4 TIMES DAILY AS NEEDED FOR  WHEEZING 18 g PRN  . ciprofloxacin (CIPRO) 250 MG tablet Take 1 tablet (250 mg total) by mouth 2 (two) times daily. (Patient not taking: Reported on 05/28/2017) 6 tablet 0  . ergocalciferol (DRISDOL) 50000 units capsule Take 1 capsule (50,000 Units total) by mouth once a week. (Patient not taking: Reported on 05/28/2017) 4 capsule 0  . nystatin (MYCOSTATIN) 100000 UNIT/ML suspension Take 5 mLs (500,000 Units total) by mouth 4 (four) times daily. (Patient not taking: Reported on 05/28/2017) 60 mL 0  . triamcinolone cream (KENALOG) 0.1 % Apply 1 application topically 2 (two) times daily. (Patient not taking: Reported on 05/28/2017) 30 g 0   No facility-administered medications prior to visit.     Review of Systems;  Patient denies headache, fevers, malaise,  unintentional weight loss, skin rash, eye pain, sinus congestion and sinus pain, sore throat, dysphagia,  hemoptysis , cough, dyspnea, wheezing, chest pain, palpitations, orthopnea, edema, abdominal pain, nausea, melena, diarrhea, constipation, flank pain, dysuria, hematuria, urinary  Frequency, nocturia, numbness, tingling, seizures,  Focal weakness, Loss of consciousness,  Tremor, insomnia, depression, anxiety, and suicidal ideation.      Objective:  BP 118/74 (BP Location: Left Arm, Patient Position: Sitting, Cuff Size: Normal)   Pulse 80   Temp 97.9 F (36.6 C) (Oral)   Resp 15   Ht _0  (1.702 m)   Wt 148 lb 6.4 oz (67.3 kg)   SpO2 98%   BMI 23.24 kg/m   BP Readings from Last 3 Encounters:  05/28/17 118/74  11/26/16 100/70  06/15/16 128/82    Wt Readings from Last 3 Encounters:  05/28/17 148 lb 6.4 oz (67.3 kg)  11/26/16 150 lb 6.4 oz (68.2 kg)  06/15/16 170 lb 4 oz (77.2 kg)    General appearance: alert, cooperative and appears stated age Ears: normal TM's and external ear canals both ears Throat: lips, mucosa, and tongue normal; teeth and gums normal Neck: no adenopathy, no carotid bruit, supple, symmetrical, trachea midline and thyroid not enlarged, symmetric, no tenderness/mass/nodules Back: symmetric, no curvature. ROM normal. No CVA tenderness. Lungs: clear to auscultation bilaterally Heart: regular rate and rhythm, S1, S2 normal, no murmur, click, rub or gallop Abdomen: soft, non-tender; bowel sounds normal; no masses,  no organomegaly Pulses: 2+ and symmetric Skin: Skin color, texture, turgor normal. No rashes or lesions Lymph nodes: Cervical, supraclavicular, and axillary nodes normal.  Lab Results  Component Value Date   HGBA1C 5.8 05/28/2017   HGBA1C 5.9 11/26/2016   HGBA1C 6.1 06/15/2016    Lab Results  Component Value Date   CREATININE 0.54 05/28/2017   CREATININE 0.62 05/26/2016   CREATININE 0.6 01/23/2014    Lab Results  Component Value Date    WBC 8.6 05/28/2017   HGB 13.8 05/28/2017   HCT 41.6 05/28/2017   PLT 260.0 05/28/2017   GLUCOSE 77 05/28/2017   CHOL 174 05/26/2016   TRIG 92.0 05/26/2016   HDL 60.30 05/26/2016   LDLDIRECT 141.8 12/14/2012   LDLCALC 96 05/26/2016   ALT 42 (H) 05/28/2017   AST 21 05/28/2017   NA 139 05/28/2017   K 3.8 05/28/2017   CL 103 05/28/2017   CREATININE 0.54 05/28/2017   BUN 15 05/28/2017   CO2 29 05/28/2017   TSH 1.13 05/26/2016   HGBA1C 5.8 05/28/2017    No results found.  Assessment & Plan:   Problem List Items Addressed This Visit    Dental abscess    She has persistent pain and is scheduled for completion of root canal next.  CBC and ESR are normal.  No indication for more antibiotics      Hypertension    Well controlled on current regimen. Renal function stable, no changes today.  Lab Results  Component Value Date   CREATININE 0.54 05/28/2017   Lab Results  Component Value Date   NA 139 05/28/2017   K 3.8 05/28/2017   CL 103 05/28/2017   CO2 29 05/28/2017         Overweight (BMI 25.0-29.9)    BMI now < 24 .  I have congratulated her in reduction of   BMI and encouraged  Continued adherence to a  low glycemic index diet and regular exercise a minimum of 5 days per week.        Prediabetes    A1c has been reduce to 5.8  With  weight loss and low carb diet.    Lab Results  Component Value Date   HGBA1C 5.8 05/28/2017         Relevant Orders   Hemoglobin A1c (Completed)   Comprehensive metabolic panel (Completed)    Other Visit Diagnoses    Abscessed tooth    -  Primary   Relevant Orders   Sedimentation rate (Completed)   C-reactive protein (Completed)   CBC with Differential/Platelet (Completed)      I have discontinued Zetta Bills. Faith's triamcinolone cream, ergocalciferol, nystatin, and ciprofloxacin. I am also having her maintain her VENTOLIN HFA, Vitamin D3, and losartan.  No orders of the defined types were placed in this  encounter.   Medications Discontinued During This Encounter  Medication Reason  . ciprofloxacin (CIPRO) 250 MG tablet Completed Course  . ergocalciferol (DRISDOL) 50000 units capsule Completed Course  . nystatin (MYCOSTATIN) 100000 UNIT/ML suspension Patient has not taken in last 30 days  . triamcinolone cream (KENALOG) 0.1 % Patient has not taken in last 30 days    Follow-up: Return in about 6 months (around 11/26/2017).   Crecencio Mc, MD

## 2017-05-28 NOTE — Patient Instructions (Addendum)
Your are at your ideal weight!  Your Ideal weight is when  your BMI is under 24.99 and over 20   Currently your Body mass index is 23.24 kg/m.   Continue losartan 50 mg daily unless your blood pressure starts to drop below 100

## 2017-05-30 DIAGNOSIS — K047 Periapical abscess without sinus: Secondary | ICD-10-CM | POA: Insufficient documentation

## 2017-05-30 NOTE — Assessment & Plan Note (Signed)
BMI now < 24 .  I have congratulated her in reduction of   BMI and encouraged  Continued adherence to a  low glycemic index diet and regular exercise a minimum of 5 days per week.

## 2017-05-30 NOTE — Assessment & Plan Note (Addendum)
A1c has been reduce to 5.8  With  weight loss and low carb diet.    Lab Results  Component Value Date   HGBA1C 5.8 05/28/2017

## 2017-05-30 NOTE — Assessment & Plan Note (Signed)
She has persistent pain and is scheduled for completion of root canal next.  CBC and ESR are normal.  No indication for more antibiotics

## 2017-05-30 NOTE — Assessment & Plan Note (Signed)
Well controlled on current regimen. Renal function stable, no changes today.  Lab Results  Component Value Date   CREATININE 0.54 05/28/2017   Lab Results  Component Value Date   NA 139 05/28/2017   K 3.8 05/28/2017   CL 103 05/28/2017   CO2 29 05/28/2017

## 2017-06-28 ENCOUNTER — Telehealth: Payer: Self-pay | Admitting: Radiology

## 2017-06-28 DIAGNOSIS — R740 Nonspecific elevation of levels of transaminase and lactic acid dehydrogenase [LDH]: Principal | ICD-10-CM

## 2017-06-28 DIAGNOSIS — R7401 Elevation of levels of liver transaminase levels: Secondary | ICD-10-CM

## 2017-06-28 NOTE — Telephone Encounter (Signed)
cmet ordered 

## 2017-06-28 NOTE — Addendum Note (Signed)
Addended by: Crecencio Mc on: 06/28/2017 02:59 PM   Modules accepted: Orders

## 2017-06-28 NOTE — Telephone Encounter (Signed)
Pt coming in for labs tomorrow, please place future orders. Thank you.  

## 2017-06-29 ENCOUNTER — Other Ambulatory Visit (INDEPENDENT_AMBULATORY_CARE_PROVIDER_SITE_OTHER): Payer: BLUE CROSS/BLUE SHIELD

## 2017-06-29 DIAGNOSIS — R740 Nonspecific elevation of levels of transaminase and lactic acid dehydrogenase [LDH]: Secondary | ICD-10-CM

## 2017-06-29 DIAGNOSIS — R7401 Elevation of levels of liver transaminase levels: Secondary | ICD-10-CM

## 2017-06-29 LAB — COMPREHENSIVE METABOLIC PANEL
ALBUMIN: 4.4 g/dL (ref 3.5–5.2)
ALK PHOS: 80 U/L (ref 39–117)
ALT: 19 U/L (ref 0–35)
AST: 15 U/L (ref 0–37)
BILIRUBIN TOTAL: 0.5 mg/dL (ref 0.2–1.2)
BUN: 15 mg/dL (ref 6–23)
CO2: 30 mEq/L (ref 19–32)
Calcium: 9.6 mg/dL (ref 8.4–10.5)
Chloride: 104 mEq/L (ref 96–112)
Creatinine, Ser: 0.56 mg/dL (ref 0.40–1.20)
GFR: 120.61 mL/min (ref >60.00)
Glucose, Bld: 98 mg/dL (ref 70–99)
POTASSIUM: 4 meq/L (ref 3.5–5.1)
Sodium: 140 mEq/L (ref 135–145)
TOTAL PROTEIN: 6.9 g/dL (ref 6.0–8.3)

## 2017-06-30 ENCOUNTER — Encounter: Payer: Self-pay | Admitting: *Deleted

## 2017-09-07 ENCOUNTER — Ambulatory Visit (INDEPENDENT_AMBULATORY_CARE_PROVIDER_SITE_OTHER): Payer: BLUE CROSS/BLUE SHIELD | Admitting: Internal Medicine

## 2017-09-07 ENCOUNTER — Encounter: Payer: Self-pay | Admitting: Internal Medicine

## 2017-09-07 VITALS — BP 118/70 | HR 84 | Temp 98.8°F | Resp 15 | Ht 67.0 in | Wt 151.2 lb

## 2017-09-07 DIAGNOSIS — R109 Unspecified abdominal pain: Secondary | ICD-10-CM | POA: Diagnosis not present

## 2017-09-07 DIAGNOSIS — Z87898 Personal history of other specified conditions: Secondary | ICD-10-CM | POA: Diagnosis not present

## 2017-09-07 DIAGNOSIS — R1011 Right upper quadrant pain: Secondary | ICD-10-CM

## 2017-09-07 LAB — POCT URINALYSIS DIPSTICK
BILIRUBIN UA: NEGATIVE
GLUCOSE UA: NEGATIVE
Ketones, UA: NEGATIVE
LEUKOCYTES UA: NEGATIVE
Nitrite, UA: NEGATIVE
Protein, UA: NEGATIVE
Spec Grav, UA: 1.015 (ref 1.010–1.025)
Urobilinogen, UA: 0.2 E.U./dL
pH, UA: 5.5 (ref 5.0–8.0)

## 2017-09-07 MED ORDER — HYDROCODONE-ACETAMINOPHEN 10-325 MG PO TABS: 1.0000 | ORAL_TABLET | Freq: Four times a day (QID) | ORAL | 0 refills | Status: DC | PRN

## 2017-09-07 MED ORDER — TAMSULOSIN HCL 0.4 MG PO CAPS: 0.4000 mg | ORAL_CAPSULE | Freq: Every day | ORAL | 0 refills | Status: DC

## 2017-09-07 MED ORDER — CIPROFLOXACIN HCL 250 MG PO TABS: 250.0000 mg | ORAL_TABLET | Freq: Two times a day (BID) | ORAL | 0 refills | Status: DC

## 2017-09-07 NOTE — Progress Notes (Signed)
Subjective:  Patient ID: Dawn Russell, female    DOB: February 21, 1966  Age: 52 y.o. MRN: [id number]  CC: The primary encounter diagnosis was Flank pain. Diagnoses of Right upper quadrant abdominal pain and History of right flank pain were also pertinent to this visit.  HPI Dawn Russell presents for right sided  flank pain that first occurred a few weeks ago and resolved.  Recurred yesterday along with  suprapubic pressure,  Then resolved,  Then last night had blood in urine, which has been intermittent today . Thinks she passed a clot or a stone.    History of kidney stone over 8 years ago.     Outpatient Medications Prior to Visit  Medication Sig Dispense Refill  . Cholecalciferol (VITAMIN D3) 1000 units CAPS Take 1,000 Units by mouth.    . losartan (COZAAR) 50 MG tablet TAKE 1 TABLET DAILY 90 tablet 1  . VENTOLIN HFA 108 (90 BASE) MCG/ACT inhaler INHALE TWO PUFFS 4 TIMES DAILY AS NEEDED FOR  WHEEZING 18 g PRN   No facility-administered medications prior to visit.     Review of Systems;  Patient denies headache, fevers, malaise, unintentional weight loss, skin rash, eye pain, sinus congestion and sinus pain, sore throat, dysphagia,  hemoptysis , cough, dyspnea, wheezing, chest pain, palpitations, orthopnea, edema, abdominal pain, nausea, melena, diarrhea, constipation, flank pain, dysuria, hematuria, urinary  Frequency, nocturia, numbness, tingling, seizures,  Focal weakness, Loss of consciousness,  Tremor, insomnia, depression, anxiety, and suicidal ideation.      Objective:  BP 118/70 (BP Location: Left Arm, Patient Position: Sitting, Cuff Size: Normal)   Pulse 84   Temp 98.8 F (37.1 C) (Oral)   Resp 15   Ht 5' 7"  (1.702 m)   Wt 151 lb 3.2 oz (68.6 kg)   SpO2 98%   BMI 23.68 kg/m   BP Readings from Last 3 Encounters:  09/08/17 138/74  09/07/17 118/70  05/28/17 118/74    Wt Readings from Last 3 Encounters:  09/08/17 148 lb (67.1 kg)  09/07/17 151 lb 3.2 oz (68.6 kg)   05/28/17 148 lb 6.4 oz (67.3 kg)    General appearance: alert, cooperative and appears stated age Ears: normal TM's and external ear canals both ears Throat: lips, mucosa, and tongue normal; teeth and gums normal Neck: no adenopathy, no carotid bruit, supple, symmetrical, trachea midline and thyroid not enlarged, symmetric, no tenderness/mass/nodules Back: symmetric, no curvature. ROM normal. No CVA tenderness. Lungs: clear to auscultation bilaterally Heart: regular rate and rhythm, S1, S2 normal, no murmur, click, rub or gallop Abdomen: soft, non-tender; bowel sounds normal; no masses,  no organomegaly Pulses: 2+ and symmetric Skin: Skin color, texture, turgor normal. No rashes or lesions Lymph nodes: Cervical, supraclavicular, and axillary nodes normal.  Lab Results  Component Value Date   HGBA1C 5.8 05/28/2017   HGBA1C 5.9 11/26/2016   HGBA1C 6.1 06/15/2016    Lab Results  Component Value Date   CREATININE 0.56 06/29/2017   CREATININE 0.54 05/28/2017   CREATININE 0.62 05/26/2016    Lab Results  Component Value Date   WBC 8.6 05/28/2017   HGB 13.8 05/28/2017   HCT 41.6 05/28/2017   PLT 260.0 05/28/2017   GLUCOSE 98 06/29/2017   CHOL 174 05/26/2016   TRIG 92.0 05/26/2016   HDL 60.30 05/26/2016   LDLDIRECT 141.8 12/14/2012   LDLCALC 96 05/26/2016   ALT 19 06/29/2017   AST 15 06/29/2017   NA 140 06/29/2017   K 4.0 06/29/2017  CL 104 06/29/2017   CREATININE 0.56 06/29/2017   BUN 15 06/29/2017   CO2 30 06/29/2017   TSH 1.13 05/26/2016   HGBA1C 5.8 05/28/2017    No results found.  Assessment & Plan:   Problem List Items Addressed This Visit    History of right flank pain    With gross hematuria.  Given history of stone and plans to leave town for 2 weeks,  Stat CT renal stone study was ordered for Wednesday morning and UA done .  Radiology read CT as negative for right sided stones,  Possible early appendicitis.  Patient is nontender on exam, afebrile,  without N/V .  Sending to Gen Surg for evaluation.  Will check CBC and ESR /CRP        Other Visit Diagnoses    Flank pain    -  Primary   Relevant Orders   POCT urinalysis dipstick (Completed)   Urinalysis, Routine w reflex microscopic (Completed)   Urine Culture   Right upper quadrant abdominal pain       Relevant Orders   CT RENAL STONE STUDY (Completed)   CBC with Differential/Platelet   Sedimentation rate    A total of 25 minutes of face to face time was spent with patient more than half of which was spent in counselling about the above mentioned conditions  and coordination of care   I am having Zetta Bills. [place] start on HYDROcodone-acetaminophen, tamsulosin, and ciprofloxacin. I am also having her maintain her VENTOLIN HFA, Vitamin D3, and losartan.  Meds ordered this encounter  Medications  . HYDROcodone-acetaminophen (NORCO) 10-325 MG tablet    Sig: Take 1 tablet by mouth every 6 (six) hours as needed. For kidney stone    Dispense:  30 tablet    Refill:  0  . tamsulosin (FLOMAX) 0.4 MG CAPS capsule    Sig: Take 1 capsule (0.4 mg total) by mouth daily.    Dispense:  30 capsule    Refill:  0  . ciprofloxacin (CIPRO) 250 MG tablet    Sig: Take 1 tablet (250 mg total) by mouth 2 (two) times daily.    Dispense:  10 tablet    Refill:  0    There are no discontinued medications.  Follow-up: No follow-ups on file.   Dawn Mc, MD

## 2017-09-07 NOTE — Patient Instructions (Signed)
You may have a stone.   I have ordered a CT of the kidneys ureters and bladder that my staff will try to get done tomorrow before you leave town  If we are unable to do so,  Take the medications with you on your trip and open your mychart account so I can send you the results of your urine tests to rule out infection  cipro  flomax vicodin

## 2017-09-08 ENCOUNTER — Ambulatory Visit (INDEPENDENT_AMBULATORY_CARE_PROVIDER_SITE_OTHER): Payer: BLUE CROSS/BLUE SHIELD | Admitting: General Surgery

## 2017-09-08 ENCOUNTER — Telehealth: Payer: Self-pay | Admitting: Internal Medicine

## 2017-09-08 ENCOUNTER — Ambulatory Visit
Admission: RE | Admit: 2017-09-08 | Discharge: 2017-09-08 | Disposition: A | Discharge status: Home / Self Care | Payer: BLUE CROSS/BLUE SHIELD | Source: Ambulatory Visit | Attending: Internal Medicine | Admitting: Internal Medicine

## 2017-09-08 ENCOUNTER — Encounter: Payer: Self-pay | Admitting: General Surgery

## 2017-09-08 ENCOUNTER — Other Ambulatory Visit (INDEPENDENT_AMBULATORY_CARE_PROVIDER_SITE_OTHER): Payer: BLUE CROSS/BLUE SHIELD

## 2017-09-08 ENCOUNTER — Other Ambulatory Visit: Payer: Self-pay | Admitting: Internal Medicine

## 2017-09-08 ENCOUNTER — Encounter: Payer: Self-pay | Admitting: Internal Medicine

## 2017-09-08 VITALS — BP 138/74 | HR 72 | Temp 98.1°F | Resp 12 | Ht 67.0 in | Wt 148.0 lb

## 2017-09-08 DIAGNOSIS — N200 Calculus of kidney: Secondary | ICD-10-CM | POA: Insufficient documentation

## 2017-09-08 DIAGNOSIS — Z87898 Personal history of other specified conditions: Secondary | ICD-10-CM | POA: Insufficient documentation

## 2017-09-08 DIAGNOSIS — R1011 Right upper quadrant pain: Secondary | ICD-10-CM | POA: Diagnosis not present

## 2017-09-08 DIAGNOSIS — N2889 Other specified disorders of kidney and ureter: Secondary | ICD-10-CM | POA: Insufficient documentation

## 2017-09-08 DIAGNOSIS — R9389 Abnormal findings on diagnostic imaging of other specified body structures: Secondary | ICD-10-CM | POA: Insufficient documentation

## 2017-09-08 DIAGNOSIS — K37 Unspecified appendicitis: Secondary | ICD-10-CM

## 2017-09-08 DIAGNOSIS — R109 Unspecified abdominal pain: Secondary | ICD-10-CM | POA: Diagnosis not present

## 2017-09-08 LAB — URINALYSIS, ROUTINE W REFLEX MICROSCOPIC
BILIRUBIN URINE: NEGATIVE
Hgb urine dipstick: NEGATIVE
KETONES UR: NEGATIVE
LEUKOCYTES UA: NEGATIVE
NITRITE: NEGATIVE
RBC / HPF: NONE SEEN (ref 0–?)
SPECIFIC GRAVITY, URINE: 1.02 (ref 1.000–1.030)
Total Protein, Urine: NEGATIVE
URINE GLUCOSE: NEGATIVE
UROBILINOGEN UA: 0.2 (ref 0.0–1.0)
pH: 6 (ref 5.0–8.0)

## 2017-09-08 LAB — CBC WITH DIFFERENTIAL/PLATELET
BASOS PCT: 0.4 % (ref 0.0–3.0)
Basophils Absolute: 0 10*3/uL (ref 0.0–0.1)
EOS PCT: 1.3 % (ref 0.0–5.0)
Eosinophils Absolute: 0.1 10*3/uL (ref 0.0–0.7)
HEMATOCRIT: 42.2 % (ref 36.0–46.0)
Hemoglobin: 14.4 g/dL (ref 12.0–15.0)
LYMPHS PCT: 20.3 % (ref 12.0–46.0)
Lymphs Abs: 2.2 10*3/uL (ref 0.7–4.0)
MCHC: 34.2 g/dL (ref 30.0–36.0)
MCV: 82.6 fl (ref 78.0–100.0)
MONOS PCT: 4.9 % (ref 3.0–12.0)
Monocytes Absolute: 0.5 10*3/uL (ref 0.1–1.0)
Neutro Abs: 7.9 10*3/uL — ABNORMAL HIGH (ref 1.4–7.7)
Neutrophils Relative %: 73.1 % (ref 43.0–77.0)
Platelets: 261 10*3/uL (ref 150.0–400.0)
RBC: 5.11 Mil/uL (ref 3.87–5.11)
RDW: 13.2 % (ref 11.5–15.5)
WBC: 10.8 10*3/uL — AB (ref 4.0–10.5)

## 2017-09-08 LAB — SEDIMENTATION RATE: Sed Rate: 12 mm/hr (ref 0–30)

## 2017-09-08 LAB — C-REACTIVE PROTEIN: CRP: 0.2 mg/dL — ABNORMAL LOW (ref 0.5–20.0)

## 2017-09-08 NOTE — Telephone Encounter (Signed)
You had no blood in your urine .  No stone on the right,  The blood must be coming from your vagina, so you may be irritated  ,  Or having a period.

## 2017-09-08 NOTE — Telephone Encounter (Signed)
Reviewed case with Dr Bary Castilla,  Patient's surgeon of choice.  Patient will be redirected to his office rather than  Going to ER

## 2017-09-08 NOTE — Assessment & Plan Note (Signed)
With gross hematuria.  Given history of stone and plans to leave town for 2 weeks,  Stat CT renal stone study was ordered for Wednesday morning and UA done .  Radiology read CT as negative for right sided stones,  Possible early appendicitis.  Patient is nontender on exam, afebrile, without N/V .  Sending to Gen Surg for evaluation.  Will check CBC and ESR Lorayne Marek

## 2017-09-08 NOTE — Telephone Encounter (Signed)
Patient has a follow up with Dr Bary Castilla today,.  Could she come by office today for additional labs that would be helpful to Dr B for tomorrow's eval>

## 2017-09-08 NOTE — Telephone Encounter (Signed)
FYI

## 2017-09-08 NOTE — Patient Instructions (Addendum)
Call and report in the morning.   Check tempeture.. The patient is aware to call back for any questions or concerns.

## 2017-09-08 NOTE — Telephone Encounter (Signed)
Patient was called and notified to go to Dr. Bary Castilla office per [place] patient stated she will leave now for his office. PCP call Dr. Bary Castilla and arranged emergency visit for patient.

## 2017-09-08 NOTE — Progress Notes (Signed)
Patient ID: Dawn Russell, female   DOB: December 22, 1965, 52 y.o.   MRN: [id number]  Chief Complaint  Patient presents with  . Abdominal Pain    HPI Dawn Russell is a 52 y.o. female here today for a evaluation of possible appendicitis as reported on CT imaging.  The patient was in usual state of health until about 2 weeks ago when she had some upper back discomfort.  This passed within 12 hours.  She really thought nothing more of it until 1-2 days ago when she had transient discomfort in the hypogastric area that lasted for about 30 minutes and then resolved.  She reports that her bowels have been a little bit more frequent than usual for the last week, but no history of mucus, blood or diarrhea.  She was seen by her PCP yesterday because of the past history of renal colic and by report urine dipstick was positive for blood.  A CT scan was obtained today to determine if she had developed new stone and the appendix was reported as mildly dilated 8 mm with possible early changes of appendicitis.  She is seen now for assessment.  The patient's general health is excellent.  She denies any fever, chills, change in appetite.  No dysuria.  Significant weight loss last year with change to a low glycemic diet.  No recent weight change.  The patient had planned to travel to [place], [place] ( her birthplace) later today to visit her sister.  This has been put on hold.  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  Husband, Gwyndolyn Saxon present at visit.   HPI  Past Medical History:  Diagnosis Date  . Abnormal Pap smear    ASCUS  . Asthma    exercise and cold(weather) induced  . Concussion 06/24/14   improved  . Dyspareunia   . Fibrocystic breast changes   . GERD (gastroesophageal reflux disease)    in past  . History of renal calculi July 2012   s/p lithotripsy [place]  . Hypertension       . Kidney stones   . 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 Pelvic pain in female   . Pelvic relaxation   . Vaginitis     Past Surgical History:  Procedure Laterality Date  . CESAREAN SECTION    . CHOLECYSTECTOMY    . COLONOSCOPY WITH PROPOFOL N/A 10/11/2015   Procedure: COLONOSCOPY WITH PROPOFOL;  Surgeon: Lucilla Lame, MD;  Location: [facility];  Service: Endoscopy;  Laterality: N/A;  latex sensitivity  . OOPHORECTOMY  2004   due to large benign cyst,  elevated CA125  . POLYPECTOMY  10/11/2015   Procedure: POLYPECTOMY;  Surgeon: Lucilla Lame, MD;  Location: [facility];  Service: Endoscopy;;    Family History  Problem Relation Age of Onset  . Hypertension Father   . Stroke Father   . Hypertension Mother   . Hyperlipidemia Mother   . Cancer Sister        brain tumor    Social History Social History   Tobacco Use  . Smoking status: Never Smoker  . Smokeless tobacco: Never Used  Substance Use Topics  . Alcohol use: No  . Drug use: No    Allergies  Allergen Reactions  . Amoxicillin Rash  . Latex Rash    Burning after internal ultrasound  . Monistat [Miconazole] Rash    Itching and burning  .  Septra [Bactrim] Rash  . Sulfa Antibiotics Rash    Current Outpatient Medications  Medication Sig Dispense Refill  . Cholecalciferol (VITAMIN D3) 1000 units CAPS Take 1,000 Units by mouth.    . losartan (COZAAR) 50 MG tablet TAKE 1 TABLET DAILY 90 tablet 1  . tamsulosin (FLOMAX) 0.4 MG CAPS capsule Take 1 capsule (0.4 mg total) by mouth daily. 30 capsule 0  . VENTOLIN HFA 108 (90 BASE) MCG/ACT inhaler INHALE TWO PUFFS 4 TIMES DAILY AS NEEDED FOR  WHEEZING 18 g PRN  . ciprofloxacin (CIPRO) 250 MG tablet Take 1 tablet (250 mg total) by mouth 2 (two) times daily. (Patient not taking: Reported on 09/08/2017) 10 tablet 0  . HYDROcodone-acetaminophen (NORCO) 10-325 MG tablet Take 1 tablet by mouth every 6 (six) hours as needed. For kidney stone (Patient not taking: Reported on 09/08/2017) 30 tablet 0    No current facility-administered medications for this visit.     Review of Systems Review of Systems  Constitutional: Negative.   Respiratory: Negative.   Cardiovascular: Negative.   Gastrointestinal: Negative for abdominal pain.    Blood pressure 138/74, pulse 72, temperature 98.1 F (36.7 C), temperature source Oral, resp. rate 12, height 5\' 7"  (1.702 m), weight 148 lb (67.1 kg).  Physical Exam Physical Exam  Constitutional: She is oriented to person, place, and time. She appears well-developed and well-nourished.  Eyes: Conjunctivae are normal. No scleral icterus.  Neck: Neck supple.  Cardiovascular: Normal rate, regular rhythm and normal heart sounds.  Pulmonary/Chest: Effort normal and breath sounds normal.  Abdominal: Soft. Normal appearance and bowel sounds are normal. There is no hepatosplenomegaly. There is no tenderness.  Deep palpation in the right lower quadrant both in the supine and left lateral decubitus position is unremarkable.  Lymphadenopathy:    She has no cervical adenopathy.  Neurological: She is alert and oriented to person, place, and time.  Skin: Skin is warm and dry.    Data Reviewed  CT scan of the abdomen and pelvis completed with IV contrast only from earlier this morning was reviewed.  Mild, but abnormally thickened appendix to 8 mm noted, mild stranding in the area appreciated.  Nonobstructive left renal stone.  Hypodense renal lesions previously identified in July 2012 thought to represent cyst.  These films were independently reviewed.  Assessment    Mild dilatation of the appendix at 1-2 mm above baseline with no clinical history to correlate with potential for early appendicitis.    Plan  The appendix is anteriorly located almost at the abdominal wall.  If she had an inflammatory process I think that she would be symptomatic although she has a high threshold for pain.  I have asked her not to travel tonight but rather to take her  temperature this evening at bedtime and in the morning, and to call with a progress report in the morning.  She has been encouraged to call earlier if she begins to note any right lower quadrant pain.  The patient is aware to call back for any questions or concerns.    HPI, Physical Exam, Assessment and Plan have been scribed under the direction and in the presence of Hervey Ard, MD.  Gaspar Cola, CMA  I have completed the exam and reviewed the above documentation for accuracy and completeness.  I agree with the above.  Haematologist has been used and any errors in dictation or transcription are unintentional.  Hervey Ard, M.D., F.A.C.S.  Forest Gleason Jaeleen Inzunza 09/08/2017, 5:08 PM

## 2017-09-08 NOTE — Telephone Encounter (Signed)
Linus Orn called from [facility] requesting for  Dr.Tullo to speak with the radiologist there.  Called transferred to [employer].

## 2017-09-08 NOTE — Telephone Encounter (Signed)
Copied from [facility] [id number]. Topic: General - Other >> Sep 08, 2017  2:39 PM Yvette Rack wrote: Reason for CRM: patient calling to speak with Linus Orn wanting her to know that Dr Bary Castilla didn't want to remove appendix right now because it was 8 millimeter in size he don't think its necessary and want her to take temperatures tonight and in the morning and give him a call back tomorrow pt also want to know why she is still seeing blood on tissue when she goes to the bathroom

## 2017-09-08 NOTE — Telephone Encounter (Signed)
STAT CT done to rule out renal stones,  Early appendicitis diagnosed via conversation with radiologist.  Patient will be called and advised not to leave town for trip and to go to ER for admission

## 2017-09-09 ENCOUNTER — Telehealth: Payer: Self-pay | Admitting: *Deleted

## 2017-09-09 ENCOUNTER — Encounter: Payer: Self-pay | Admitting: Internal Medicine

## 2017-09-09 LAB — URINE CULTURE
MICRO NUMBER:: 90411921
SPECIMEN QUALITY: ADEQUATE

## 2017-09-09 NOTE — Telephone Encounter (Signed)
Called to let us know her temperature 97.8 . She states she feels okay, no different since yesterday. Twinge of pain in back. Temperature was 97.8 last night and 97.6 this morning. See did see Dr Derrel Nip and had labs drawn.

## 2017-09-10 ENCOUNTER — Telehealth: Payer: Self-pay

## 2017-09-10 ENCOUNTER — Ambulatory Visit (INDEPENDENT_AMBULATORY_CARE_PROVIDER_SITE_OTHER): Payer: BLUE CROSS/BLUE SHIELD | Admitting: General Surgery

## 2017-09-10 ENCOUNTER — Encounter: Payer: Self-pay | Admitting: General Surgery

## 2017-09-10 VITALS — BP 122/78 | HR 79 | Temp 97.9°F | Resp 14 | Ht 67.0 in | Wt 150.2 lb

## 2017-09-10 DIAGNOSIS — R9389 Abnormal findings on diagnostic imaging of other specified body structures: Secondary | ICD-10-CM

## 2017-09-10 NOTE — Progress Notes (Signed)
Patient ID: Dawn Russell, female   DOB: [birth year], 52 y.o.   MRN: [id number]  Chief Complaint  Patient presents with  . Follow-up    HPI Dawn Russell is a 52 y.o. female here for a follow up for appendicitis. She reports a temperature of 97.7 today and just some slight twinge in the right side under her rib cage when she was lying down last night.  No dietary intolerance.  Stools are slightly softer than baseline.  No diarrhea.  No right lower quadrant pain, fever or chills. HPI  Past Medical History:  Diagnosis Date  . Abnormal Pap smear    ASCUS  . Asthma    exercise and cold(weather) induced  . Concussion 06/24/14   improved  . Dyspareunia   . Fibrocystic breast changes   . GERD (gastroesophageal reflux disease)    in past  . History of renal calculi July 2012   s/p lithotripsy Dahlstedt  . Hypertension       . Kidney stones   . 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 Pelvic pain in female   . Pelvic relaxation   . Vaginitis     Past Surgical History:  Procedure Laterality Date  . CESAREAN SECTION    . CHOLECYSTECTOMY    . COLONOSCOPY WITH PROPOFOL N/A 10/11/2015   Procedure: COLONOSCOPY WITH PROPOFOL;  Surgeon: Lucilla Lame, MD;  Location: [facility];  Service: Endoscopy;  Laterality: N/A;  latex sensitivity  . OOPHORECTOMY  2004   due to large benign cyst,  elevated CA125  . POLYPECTOMY  10/11/2015   Procedure: POLYPECTOMY;  Surgeon: Lucilla Lame, MD;  Location: [facility];  Service: Endoscopy;;    Family History  Problem Relation Age of Onset  . Hypertension Father   . Stroke Father   . Hypertension Mother   . Hyperlipidemia Mother   . Cancer Sister        brain tumor    Social History Social History   Tobacco Use  . Smoking status: Never Smoker  . Smokeless tobacco: Never Used  Substance Use Topics  . Alcohol use: No  . Drug use: No    Allergies  Allergen Reactions  . Amoxicillin Rash   . Latex Rash    Burning after internal ultrasound  . Monistat [Miconazole] Rash    Itching and burning  . Septra [Bactrim] Rash  . Sulfa Antibiotics Rash    Current Outpatient Medications  Medication Sig Dispense Refill  . Cholecalciferol (VITAMIN D3) 1000 units CAPS Take 1,000 Units by mouth.    . ciprofloxacin (CIPRO) 250 MG tablet Take 1 tablet (250 mg total) by mouth 2 (two) times daily. 10 tablet 0  . HYDROcodone-acetaminophen (NORCO) 10-325 MG tablet Take 1 tablet by mouth every 6 (six) hours as needed. For kidney stone 30 tablet 0  . losartan (COZAAR) 50 MG tablet TAKE 1 TABLET DAILY 90 tablet 1  . tamsulosin (FLOMAX) 0.4 MG CAPS capsule Take 1 capsule (0.4 mg total) by mouth daily. 30 capsule 0  . VENTOLIN HFA 108 (90 BASE) MCG/ACT inhaler INHALE TWO PUFFS 4 TIMES DAILY AS NEEDED FOR  WHEEZING 18 g PRN   No current facility-administered medications for this visit.     Review of Systems Review of Systems  Constitutional: Negative.   Respiratory: Negative.   Cardiovascular: Negative.   Gastrointestinal: Negative.     Blood pressure 122/78, pulse 79, temperature 97.9 F (36.6 C), resp. rate 14, height  5\' 7"  (1.702 m), weight 150 lb 3.2 oz (68.1 kg), last menstrual period 08/23/2017.  Physical Exam Physical Exam  Constitutional: She is oriented to person, place, and time. She appears well-developed and well-nourished.  Eyes: Conjunctivae are normal. No scleral icterus.  Pulmonary/Chest: Effort normal and breath sounds normal.  Abdominal: Soft. Normal appearance and bowel sounds are normal. There is no tenderness.  Neurological: She is alert and oriented to person, place, and time.  Skin: Skin is warm and dry.  Psychiatric: She has a normal mood and affect.    Data Reviewed Laboratory studies from September 08, 2017 reviewed.  Normal sed rate and C-reactive protein.  White blood cell count lower than last year with a normal differential.  Assessment    Abnormal  abdominal imaging without clinical suggestion of acute appendicitis.    Plan    Patient is free to resume all regular activities and travel as previously planned.  She should report if she develops right lower quadrant pain, fever or chills.  Follow-up otherwise will be on an as-needed basis.    HPI, Physical Exam, Assessment and Plan have been scribed under the direction and in the presence of Robert Bellow, MD  Concepcion Living, LPN  I have completed the exam and reviewed the above documentation for accuracy and completeness.  I agree with the above.  Haematologist has been used and any errors in dictation or transcription are unintentional.  Hervey Ard, M.D., F.A.C.S.   Forest Gleason Byrnett 09/10/2017, 10:48 AM

## 2017-09-10 NOTE — Patient Instructions (Signed)
Follow up as needed. Call with any further problems.

## 2017-09-10 NOTE — Telephone Encounter (Signed)
Patient called to give and update. She says she just had a couple twinges of discomfort on her right side with lying down last night. These were short lasting. She reports that her temperature this morning is 97.7. She denies any pain or discomfort with walking around or other activities.  Dr Bary Castilla notified and he will call her today. The patient is aware of this.

## 2017-11-06 ENCOUNTER — Other Ambulatory Visit: Payer: Self-pay | Admitting: Internal Medicine

## 2017-11-26 ENCOUNTER — Ambulatory Visit: Payer: BLUE CROSS/BLUE SHIELD | Admitting: Internal Medicine

## 2017-11-26 ENCOUNTER — Encounter: Payer: Self-pay | Admitting: Internal Medicine

## 2017-11-26 VITALS — BP 108/70 | HR 79 | Temp 98.2°F | Resp 14 | Ht 67.0 in | Wt 150.4 lb

## 2017-11-26 DIAGNOSIS — R102 Pelvic and perineal pain: Secondary | ICD-10-CM

## 2017-11-26 DIAGNOSIS — Z87898 Personal history of other specified conditions: Secondary | ICD-10-CM | POA: Diagnosis not present

## 2017-11-26 DIAGNOSIS — B952 Enterococcus as the cause of diseases classified elsewhere: Secondary | ICD-10-CM | POA: Diagnosis not present

## 2017-11-26 DIAGNOSIS — N390 Urinary tract infection, site not specified: Secondary | ICD-10-CM | POA: Diagnosis not present

## 2017-11-26 DIAGNOSIS — I10 Essential (primary) hypertension: Secondary | ICD-10-CM | POA: Diagnosis not present

## 2017-11-26 DIAGNOSIS — R252 Cramp and spasm: Secondary | ICD-10-CM

## 2017-11-26 DIAGNOSIS — R7303 Prediabetes: Secondary | ICD-10-CM

## 2017-11-26 LAB — COMPREHENSIVE METABOLIC PANEL
ALT: 22 U/L (ref 0–35)
AST: 15 U/L (ref 0–37)
Albumin: 4.4 g/dL (ref 3.5–5.2)
Alkaline Phosphatase: 68 U/L (ref 39–117)
BUN: 15 mg/dL (ref 6–23)
CALCIUM: 9.8 mg/dL (ref 8.4–10.5)
CHLORIDE: 103 meq/L (ref 96–112)
CO2: 30 meq/L (ref 19–32)
Creatinine, Ser: 0.66 mg/dL (ref 0.40–1.20)
GFR: 99.62 mL/min (ref >60.00)
GLUCOSE: 106 mg/dL — AB (ref 70–99)
POTASSIUM: 4 meq/L (ref 3.5–5.1)
Sodium: 139 mEq/L (ref 135–145)
Total Bilirubin: 0.5 mg/dL (ref 0.2–1.2)
Total Protein: 7.2 g/dL (ref 6.0–8.3)

## 2017-11-26 LAB — URINALYSIS, MICROSCOPIC ONLY

## 2017-11-26 LAB — POCT URINALYSIS DIPSTICK
BILIRUBIN UA: NEGATIVE
Glucose, UA: NEGATIVE
Ketones, UA: NEGATIVE
Nitrite, UA: NEGATIVE
PROTEIN UA: POSITIVE — AB
Spec Grav, UA: 1.02 (ref 1.010–1.025)
UROBILINOGEN UA: 0.2 U/dL
pH, UA: 5.5 (ref 5.0–8.0)

## 2017-11-26 LAB — LIPID PANEL
CHOL/HDL RATIO: 3
Cholesterol: 194 mg/dL (ref 0–200)
HDL: 69.4 mg/dL (ref >39.00)
LDL CALC: 112 mg/dL — AB (ref 0–99)
NONHDL: 125.03
TRIGLYCERIDES: 66 mg/dL (ref 0.0–149.0)
VLDL: 13.2 mg/dL (ref 0.0–40.0)

## 2017-11-26 LAB — MAGNESIUM: Magnesium: 2 mg/dL (ref 1.5–2.5)

## 2017-11-26 LAB — HEMOGLOBIN A1C: Hgb A1c MFr Bld: 5.8 % (ref 4.6–6.5)

## 2017-11-26 NOTE — Progress Notes (Signed)
Subjective:  Patient ID: Dawn Russell, female    DOB: 06/24/1965  Age: 52 y.o. MRN: [id number]  CC: The primary encounter diagnosis was Prediabetes. Diagnoses of Essential hypertension, Suprapubic pain, Leg cramps, History of right flank pain, and UTI (urinary tract infection) due to Enterococcus were also pertinent to this visit.  HPI Dawn Russell presents for FOLLOW UP OM HYPERTENSION.  Patient is taking her medications as prescribed and notes no adverse effects.  Home BP readings have been done about once per week and are  generally < 130/80 .  She is avoiding added salt in her diet and walking regularly about 3 times per week for exercise  .  Recent episode of abd pain worrisome for appendicitis;  Saw Gen Surg after abnormal CT scan suggested appendicitis.  Observation advised  No recurrent symptoms  .    Outpatient Medications Prior to Visit  Medication Sig Dispense Refill  . Cholecalciferol (VITAMIN D3) 1000 units CAPS Take 1,000 Units by mouth.    . losartan (COZAAR) 50 MG tablet TAKE 1 TABLET DAILY 90 tablet 1  . VENTOLIN HFA 108 (90 BASE) MCG/ACT inhaler INHALE TWO PUFFS 4 TIMES DAILY AS NEEDED FOR  WHEEZING 18 g PRN  . ciprofloxacin (CIPRO) 250 MG tablet Take 1 tablet (250 mg total) by mouth 2 (two) times daily. (Patient not taking: Reported on 11/26/2017) 10 tablet 0  . HYDROcodone-acetaminophen (NORCO) 10-325 MG tablet Take 1 tablet by mouth every 6 (six) hours as needed. For kidney stone (Patient not taking: Reported on 11/26/2017) 30 tablet 0  . tamsulosin (FLOMAX) 0.4 MG CAPS capsule Take 1 capsule (0.4 mg total) by mouth daily. (Patient not taking: Reported on 11/26/2017) 30 capsule 0   No facility-administered medications prior to visit.     Review of Systems;  Patient denies headache, fevers, malaise, unintentional weight loss, skin rash, eye pain, sinus congestion and sinus pain, sore throat, dysphagia,  hemoptysis , cough, dyspnea, wheezing, chest pain,  palpitations, orthopnea, edema, abdominal pain, nausea, melena, diarrhea, constipation, flank pain, dysuria, hematuria, urinary  Frequency, nocturia, numbness, tingling, seizures,  Focal weakness, Loss of consciousness,  Tremor, insomnia, depression, anxiety, and suicidal ideation.      Objective:  BP 108/70 (BP Location: Left Arm, Patient Position: Sitting, Cuff Size: Normal)   Pulse 79   Temp 98.2 F (36.8 C) (Oral)   Resp 14   Ht 5\' 7"  (1.702 m)   Wt 150 lb 6.4 oz (68.2 kg)   SpO2 98%   BMI 23.56 kg/m   BP Readings from Last 3 Encounters:  11/26/17 108/70  09/10/17 122/78  09/08/17 138/74    Wt Readings from Last 3 Encounters:  11/26/17 150 lb 6.4 oz (68.2 kg)  09/10/17 150 lb 3.2 oz (68.1 kg)  09/08/17 148 lb (67.1 kg)    General appearance: alert, cooperative and appears stated age Ears: normal TM's and external ear canals both ears Throat: lips, mucosa, and tongue normal; teeth and gums normal Neck: no adenopathy, no carotid bruit, supple, symmetrical, trachea midline and thyroid not enlarged, symmetric, no tenderness/mass/nodules Back: symmetric, no curvature. ROM normal. No CVA tenderness. Lungs: clear to auscultation bilaterally Heart: regular rate and rhythm, S1, S2 normal, no murmur, click, rub or gallop Abdomen: soft, non-tender; bowel sounds normal; no masses,  no organomegaly Pulses: 2+ and symmetric Skin: Skin color, texture, turgor normal. No rashes or lesions Lymph nodes: Cervical, supraclavicular, and axillary nodes normal.  Lab Results  Component Value Date   HGBA1C  5.8 11/26/2017   HGBA1C 5.8 05/28/2017   HGBA1C 5.9 11/26/2016    Lab Results  Component Value Date   CREATININE 0.66 11/26/2017   CREATININE 0.56 06/29/2017   CREATININE 0.54 05/28/2017    Lab Results  Component Value Date   WBC 10.8 (H) 09/08/2017   HGB 14.4 09/08/2017   HCT 42.2 09/08/2017   PLT 261.0 09/08/2017   GLUCOSE 106 (H) 11/26/2017   CHOL 194 11/26/2017   TRIG  66.0 11/26/2017   HDL 69.40 11/26/2017   LDLDIRECT 141.8 12/14/2012   LDLCALC 112 (H) 11/26/2017   ALT 22 11/26/2017   AST 15 11/26/2017   NA 139 11/26/2017   K 4.0 11/26/2017   CL 103 11/26/2017   CREATININE 0.66 11/26/2017   BUN 15 11/26/2017   CO2 30 11/26/2017   TSH 1.13 05/26/2016   HGBA1C 5.8 11/26/2017    Ct Renal Stone Study  Addendum Date: 09/08/2017   ADDENDUM REPORT: 09/08/2017 10:49 ADDENDUM: The original report was by Dr. Van Clines. The following addendum is by Dr. Van Clines: These results were called by telephone at the time of interpretation on 09/08/2017 at 10:40 am to Dr. Deborra Medina , who verbally acknowledged these results. Electronically Signed   By: Van Clines M.D.   On: 09/08/2017 10:49   Result Date: 09/08/2017 CLINICAL DATA:  Right lower quadrant abdominal pain and flank pain. Microscopic hematuria. EXAM: CT ABDOMEN AND PELVIS WITHOUT CONTRAST TECHNIQUE: Multidetector CT imaging of the abdomen and pelvis was performed following the standard protocol without IV contrast. COMPARISON:  Multiple exams, including 12/11/2010 and ultrasound of 12/22/2012 FINDINGS: Lower chest: Unremarkable Hepatobiliary: Cholecystectomy.  Otherwise unremarkable. Pancreas: Unremarkable Spleen: Unremarkable Adrenals/Urinary Tract: Hypodense 1.7 cm right kidney upper pole lesion on image 30/2 is technically nonspecific with internal density of 16 Hounsfield units and faint posterior calcification along its margin. A smaller 0.8 cm cystic lesion was present in this vicinity on 12/11/2010. Similarly in indistinct left mid kidney anterior hypodense lesion measures 1.4 by 1.2 cm, internal density 14 Hounsfield units, there is previously a cyst in this location on prior contrast enhanced workup. Left kidney lower pole 5 mm nonobstructive renal calculus, image 91/4. 1-2 mm left mid kidney nonobstructive left mid kidney calculus. No hydronephrosis, hydroureter, or appreciable ureteral  calculus. Adrenal glands normal. Stomach/Bowel: Mildly thickened appendix at 8 mm with very faint periappendiceal stranding, suspicious for early acute appendicitis. Vascular/Lymphatic: Unremarkable Reproductive: Unremarkable Other: No supplemental non-categorized findings. Musculoskeletal: Unremarkable IMPRESSION: 1. Mildly but abnormally thickened appendix at 8 mm. In the context of the patient's recent right lower quadrant abdominal pain the appearance is suspicious for acute appendicitis. 2. Nonobstructive left nephrolithiasis. 3. Small hypodense renal lesions are the site of prior cysts shown on the 12/11/2010 exam and probably represent the same cysts slightly larger. Radiology assistant personnel have been notified to put me in telephone contact with the referring physician or the referring physician's clinical representative in order to discuss these findings. Once this communication is established I will issue an addendum to this report for documentation purposes. Electronically Signed: By: Van Clines M.D. On: 09/08/2017 10:28    Assessment & Plan:   Problem List Items Addressed This Visit    Prediabetes - Primary   Relevant Orders   Lipid panel (Completed)   Hemoglobin A1c (Completed)   UTI (urinary tract infection) due to Enterococcus    UTI confirmed by Culture  Patient was given Ciprofloxacin at visit and advised to start it if symptoms worsened  before the culture was finalized.       Hypertension    Well controlled on current regimen. Renal function stable, no changes today.  Lab Results  Component Value Date   CREATININE 0.66 11/26/2017   Lab Results  Component Value Date   NA 139 11/26/2017   K 4.0 11/26/2017   CL 103 11/26/2017   CO2 30 11/26/2017         Relevant Orders   Comprehensive metabolic panel (Completed)   History of right flank pain    No evidenceof appendicitis by Gen Surg evaluation.  checking urine today        Other Visit Diagnoses     Suprapubic pain       Relevant Orders   POCT urinalysis dipstick (Completed)   Urine Microscopic Only (Completed)   Urine Culture (Completed)   Leg cramps       Relevant Orders   Magnesium (Completed)     A total of 25 minutes of face to face time was spent with patient more than half of which was spent in counselling about the above mentioned conditions  and coordination of care   I have discontinued Zetta Bills. Jarquin's HYDROcodone-acetaminophen, tamsulosin, and ciprofloxacin. I am also having her maintain her VENTOLIN HFA, Vitamin D3, and losartan.  No orders of the defined types were placed in this encounter.   Medications Discontinued During This Encounter  Medication Reason  . ciprofloxacin (CIPRO) 250 MG tablet Completed Course  . HYDROcodone-acetaminophen (NORCO) 10-325 MG tablet Patient has not taken in last 30 days  . tamsulosin (FLOMAX) 0.4 MG CAPS capsule Patient has not taken in last 30 days    Follow-up: Return in about 6 months (around 05/28/2018) for CPE.   Crecencio Mc, MD

## 2017-11-26 NOTE — Patient Instructions (Signed)
IF YOUR URINARY SYMPTOMS GET WORSE, START THE CIPRO   Daily use of a probiotic advised for 3 weeks if you start an antibiotic

## 2017-11-28 DIAGNOSIS — N390 Urinary tract infection, site not specified: Secondary | ICD-10-CM

## 2017-11-28 DIAGNOSIS — B952 Enterococcus as the cause of diseases classified elsewhere: Secondary | ICD-10-CM | POA: Insufficient documentation

## 2017-11-28 LAB — URINE CULTURE
MICRO NUMBER:: 90745138
SPECIMEN QUALITY:: ADEQUATE

## 2017-11-28 NOTE — Assessment & Plan Note (Signed)
UTI confirmed by Culture  Patient was given Ciprofloxacin at visit and advised to start it if symptoms worsened before the culture was finalized.

## 2017-11-28 NOTE — Assessment & Plan Note (Signed)
No evidenceof appendicitis by Gen Surg evaluation.  checking urine today

## 2017-11-28 NOTE — Assessment & Plan Note (Signed)
Well controlled on current regimen. Renal function stable, no changes today.  Lab Results  Component Value Date   CREATININE 0.66 11/26/2017   Lab Results  Component Value Date   NA 139 11/26/2017   K 4.0 11/26/2017   CL 103 11/26/2017   CO2 30 11/26/2017

## 2018-01-19 DIAGNOSIS — Z124 Encounter for screening for malignant neoplasm of cervix: Secondary | ICD-10-CM | POA: Diagnosis not present

## 2018-01-19 DIAGNOSIS — Z1231 Encounter for screening mammogram for malignant neoplasm of breast: Secondary | ICD-10-CM | POA: Diagnosis not present

## 2018-01-19 DIAGNOSIS — R87618 Other abnormal cytological findings on specimens from cervix uteri: Secondary | ICD-10-CM | POA: Diagnosis not present

## 2018-01-19 DIAGNOSIS — Z01419 Encounter for gynecological examination (general) (routine) without abnormal findings: Secondary | ICD-10-CM | POA: Diagnosis not present

## 2018-01-19 DIAGNOSIS — Z6824 Body mass index (BMI) 24.0-24.9, adult: Secondary | ICD-10-CM | POA: Diagnosis not present

## 2018-01-19 DIAGNOSIS — C569 Malignant neoplasm of unspecified ovary: Secondary | ICD-10-CM | POA: Diagnosis not present

## 2018-01-21 ENCOUNTER — Telehealth: Payer: Self-pay | Admitting: Gastroenterology

## 2018-01-21 NOTE — Telephone Encounter (Signed)
Pt called to check when she was due for next colonoscopy per Letter in chart 5 years from 2017 pt aware

## 2018-03-07 ENCOUNTER — Other Ambulatory Visit: Payer: Self-pay | Admitting: Obstetrics and Gynecology

## 2018-03-07 DIAGNOSIS — N841 Polyp of cervix uteri: Secondary | ICD-10-CM | POA: Diagnosis not present

## 2018-03-07 DIAGNOSIS — N925 Other specified irregular menstruation: Secondary | ICD-10-CM | POA: Diagnosis not present

## 2018-03-11 ENCOUNTER — Ambulatory Visit (INDEPENDENT_AMBULATORY_CARE_PROVIDER_SITE_OTHER): Payer: BLUE CROSS/BLUE SHIELD

## 2018-03-11 DIAGNOSIS — Z23 Encounter for immunization: Secondary | ICD-10-CM | POA: Diagnosis not present

## 2018-03-24 DIAGNOSIS — N841 Polyp of cervix uteri: Secondary | ICD-10-CM | POA: Diagnosis not present

## 2018-03-24 DIAGNOSIS — N925 Other specified irregular menstruation: Secondary | ICD-10-CM | POA: Diagnosis not present

## 2018-04-19 DIAGNOSIS — N9089 Other specified noninflammatory disorders of vulva and perineum: Secondary | ICD-10-CM | POA: Diagnosis not present

## 2018-04-19 DIAGNOSIS — R102 Pelvic and perineal pain: Secondary | ICD-10-CM | POA: Diagnosis not present

## 2018-04-19 DIAGNOSIS — R319 Hematuria, unspecified: Secondary | ICD-10-CM | POA: Diagnosis not present

## 2018-04-19 DIAGNOSIS — N9489 Other specified conditions associated with female genital organs and menstrual cycle: Secondary | ICD-10-CM | POA: Diagnosis not present

## 2018-04-28 ENCOUNTER — Ambulatory Visit: Payer: BLUE CROSS/BLUE SHIELD | Admitting: Internal Medicine

## 2018-04-28 ENCOUNTER — Encounter: Payer: Self-pay | Admitting: Internal Medicine

## 2018-04-28 VITALS — BP 144/64 | HR 85 | Temp 98.2°F | Ht 67.0 in | Wt 161.8 lb

## 2018-04-28 DIAGNOSIS — N200 Calculus of kidney: Secondary | ICD-10-CM | POA: Diagnosis not present

## 2018-04-28 DIAGNOSIS — R109 Unspecified abdominal pain: Secondary | ICD-10-CM | POA: Diagnosis not present

## 2018-04-28 MED ORDER — KETOROLAC TROMETHAMINE 60 MG/2ML IM SOLN: 60.0000 mg | Freq: Once | INTRAMUSCULAR | Status: AC

## 2018-04-28 MED ORDER — KETOROLAC TROMETHAMINE 60 MG/2ML IM SOLN
30.0000 mg | Freq: Once | INTRAMUSCULAR | Status: AC
  Administered 2018-04-28: 30 mg via INTRAMUSCULAR

## 2018-04-28 NOTE — Progress Notes (Signed)
Chief Complaint  Patient presents with  . Flank Pain   Acute visit  1. Left flank pain with h/o kidney stones (noted CT renal 09/08/17) and prior h/o lithrotripsy years ago. Pain since 11/9 or 04/17/18 and left lower quadrant pain last night 9/10 took hydrocodone-acet. 10-325 mg x 1 and flomax x 1 w/o relief. She saw OB/GYN Dr. Raphael Gibney 04/19/18 for boil in her vagina and was given cipro (last dose this am) and had urine done with large blood in urine and tested STDs G/C/T/HSV which were negative and she will f/u with him 05/03/18   Of note she is currently on cycle now which started 04/25/18     Review of Systems  Constitutional: Negative for fever and weight loss.  HENT: Negative for hearing loss.   Respiratory: Negative for shortness of breath.   Cardiovascular: Negative for chest pain.  Genitourinary: Positive for flank pain. Negative for hematuria.   Past Medical History:  Diagnosis Date  . Abnormal Pap smear    ASCUS  . Asthma    exercise and cold(weather) induced  . Concussion 06/24/14   improved  . Dyspareunia   . Fibrocystic breast changes   . GERD (gastroesophageal reflux disease)    in past  . History of renal calculi July 2012   s/p lithotripsy Dahlstedt  . Hypertension       . Kidney stones   . 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 Pelvic pain in female   . Pelvic relaxation   . Vaginitis    Past Surgical History:  Procedure Laterality Date  . CESAREAN SECTION    . CHOLECYSTECTOMY    . COLONOSCOPY WITH PROPOFOL N/A 10/11/2015   Procedure: COLONOSCOPY WITH PROPOFOL;  Surgeon: Lucilla Lame, MD;  Location: [facility];  Service: Endoscopy;  Laterality: N/A;  latex sensitivity  . OOPHORECTOMY  2004   due to large benign cyst,  elevated CA125  . POLYPECTOMY  10/11/2015   Procedure: POLYPECTOMY;  Surgeon: Lucilla Lame, MD;  Location: [facility];  Service: Endoscopy;;   Family History  Problem Relation Age of  Onset  . Hypertension Father   . Stroke Father   . Hypertension Mother   . Hyperlipidemia Mother   . Cancer Sister        brain tumor   Social History   Socioeconomic History  . Marital status: Married    Spouse name: Not on file  . Number of children: Not on file  . Years of education: Not on file  . Highest education level: Not on file  Occupational History  . Not on file  Social Needs  . Financial resource strain: Not on file  . Food insecurity:    Worry: Not on file    Inability: Not on file  . Transportation needs:    Medical: Not on file    Non-medical: Not on file  Tobacco Use  . Smoking status: Never Smoker  . Smokeless tobacco: Never Used  Substance and Sexual Activity  . Alcohol use: No  . Drug use: No  . Sexual activity: Yes    Birth control/protection: Surgical    Comment: vas.  Lifestyle  . Physical activity:    Days per week: Not on file    Minutes per session: Not on file  . Stress: Not on file  Relationships  . Social connections:    Talks on phone: Not on file    Gets together: Not on file  Attends religious service: Not on file    Active member of club or organization: Not on file    Attends meetings of clubs or organizations: Not on file    Relationship status: Not on file  . Intimate partner violence:    Fear of current or ex partner: Not on file    Emotionally abused: Not on file    Physically abused: Not on file    Forced sexual activity: Not on file  Other Topics Concern  . Not on file  Social History Narrative  . Not on file   Current Meds  Medication Sig  . Cholecalciferol (VITAMIN D3) 1000 units CAPS Take 1,000 Units by mouth.  Marland Kitchen HYDROcodone-acetaminophen (NORCO/VICODIN) 5-325 MG tablet Take 1 tablet by mouth every 6 (six) hours as needed for moderate pain.  Marland Kitchen losartan (COZAAR) 50 MG tablet TAKE 1 TABLET DAILY  . Probiotic Product (PROBIOTIC-10 PO) Take by mouth.  . VENTOLIN HFA 108 (90 BASE) MCG/ACT inhaler INHALE TWO PUFFS  4 TIMES DAILY AS NEEDED FOR  WHEEZING   Allergies  Allergen Reactions  . Amoxicillin Rash  . Latex Rash    Burning after internal ultrasound  . Monistat [Miconazole] Rash    Itching and burning  . Septra [Bactrim] Rash  . Sulfa Antibiotics Rash   No results found for this or any previous visit (from the past 2160 hour(s)). Objective  Body mass index is 25.34 kg/m. Wt Readings from Last 3 Encounters:  04/28/18 161 lb 12.8 oz (73.4 kg)  11/26/17 150 lb 6.4 oz (68.2 kg)  09/10/17 150 lb 3.2 oz (68.1 kg)   Temp Readings from Last 3 Encounters:  04/28/18 98.2 F (36.8 C) (Oral)  11/26/17 98.2 F (36.8 C) (Oral)  09/10/17 97.9 F (36.6 C)   BP Readings from Last 3 Encounters:  04/28/18 (!) 144/64  11/26/17 108/70  09/10/17 122/78   Pulse Readings from Last 3 Encounters:  04/28/18 85  11/26/17 79  09/10/17 79    Physical Exam  Constitutional: She is oriented to person, place, and time. She appears well-developed and well-nourished. She is cooperative.  HENT:  Head: Normocephalic and atraumatic.  Mouth/Throat: Oropharynx is clear and moist and mucous membranes are normal.  Eyes: Pupils are equal, round, and reactive to light. Conjunctivae are normal.  Cardiovascular: Normal rate, regular rhythm and normal heart sounds.  Pulmonary/Chest: Effort normal and breath sounds normal.  Abdominal: Soft. Bowel sounds are normal. There is no tenderness. There is no CVA tenderness.  Neurological: She is alert and oriented to person, place, and time. Gait normal.  Skin: Skin is warm, dry and intact.  Psychiatric: She has a normal mood and affect. Her speech is normal and behavior is normal. Judgment and thought content normal. Cognition and memory are normal.  Vitals reviewed.   Assessment   1. Left flank pain CT 09/08/17 +nephrolithiasis one 5 mm and 2 others 1-2 mm  Plan   1. Refer to [facility] stat Given toradol injection 30 mg x 1  flomax 0.4 qhs 1 pill and  hydrocodone-acet 10-325 1/2 pill qhs prn F/u with PCP as sch   Provider: Dr. Olivia Mackie McLean-Scocuzza-[facility] Medicine

## 2018-04-28 NOTE — Progress Notes (Signed)
Pre visit review using our clinic review tool, if applicable. No additional management support is needed unless otherwise documented below in the visit note. 

## 2018-04-28 NOTE — Addendum Note (Signed)
Addended by: Nanci Pina on: 04/28/2018 01:35 PM   Modules accepted: Orders

## 2018-04-28 NOTE — Patient Instructions (Addendum)
Kidney Stones Kidney stones (urolithiasis) are solid, rock-like deposits that form inside of the organs that make urine (kidneys). A kidney stone may form in a kidney and move into the bladder, where it can cause intense pain and block the flow of urine. Kidney stones are created when high levels of certain minerals are found in the urine. They are usually passed through urination, but in some cases, medical treatment may be needed to remove them. What are the causes? Kidney stones may be caused by:  A condition in which certain glands produce too much parathyroid hormone (primary hyperparathyroidism), which causes too much calcium buildup in the blood.  Buildup of uric acid crystals in the bladder (hyperuricosuria). Uric acid is a chemical that the body produces when you eat certain foods. It usually exits the body in the urine.  Narrowing (stricture) of one or both of the tubes that drain urine from the kidneys to the bladder (ureters).  A kidney blockage that is present at birth (congenital obstruction).  Past surgery on the kidney or the ureters, such as gastric bypass surgery.  What increases the risk? The following factors make you more likely to develop kidney stones:  Having had a kidney stone in the past.  Having a family history of kidney stones.  Not drinking enough water.  Eating a diet that is high in protein, salt (sodium), or sugar.  Being overweight or obese.  What are the signs or symptoms? Symptoms of a kidney stone may include:  Nausea.  Vomiting.  Blood in the urine (hematuria).  Pain in the side of the abdomen, right below the ribs (flank pain). Pain usually spreads (radiates) to the groin.  Needing to urinate frequently or urgently.  How is this diagnosed? This condition may be diagnosed based on:  Your medical history.  A physical exam.  Blood tests.  Urine tests.  CT scan.  Abdominal X-ray.  A procedure to examine the inside of the  bladder (cystoscopy).  How is this treated? Treatment for kidney stones depends on the size, location, and makeup of the stones. Treatment may involve:  Analyzing your urine before and after you pass the stone through urination.  Being monitored at the hospital until you pass the stone through urination.  Increasing your fluid intake and decreasing the amount of calcium and protein in your diet.  A procedure to break up kidney stones in the bladder using: ? A focused beam of light (laser therapy). ? Shock waves (extracorporeal shock wave lithotripsy).  Surgery to remove kidney stones. This may be needed if you have severe pain or have stones that block your urinary tract.  Follow these instructions at home: Eating and drinking   Drink enough fluid to keep your urine clear or pale yellow. This will help you to pass the kidney stone.  If directed, change your diet. This may include: ? Limiting how much sodium you eat. ? Eating more fruits and vegetables. ? Limiting how much meat, poultry, fish, and eggs you eat.  Follow instructions from your health care provider about eating or drinking restrictions. General instructions  Collect urine samples as told by your health care provider. You may need to collect a urine sample: ? 24 hours after you pass the stone. ? 8-12 weeks after passing the kidney stone, and every 6-12 months after that.  Strain your urine every time you urinate, for as long as directed. Use the strainer that your health care provider recommends.  Do not throw out  the kidney stone after passing it. Keep the stone so it can be tested by your health care provider. Testing the makeup of your kidney stone may help prevent you from getting kidney stones in the future.  Take over-the-counter and prescription medicines only as told by your health care provider.  Keep all follow-up visits as told by your health care provider. This is important. You may need follow-up  X-rays or ultrasounds to make sure that your stone has passed. How is this prevented? To prevent another kidney stone:  Drink enough fluid to keep your urine clear or pale yellow. This is the best way to prevent kidney stones.  Eat a healthy diet and follow recommendations from your health care provider about foods to avoid. You may be instructed to eat a low-protein diet. Recommendations vary depending on the type of kidney stone that you have.  Maintain a healthy weight.  Contact a health care provider if:  You have pain that gets worse or does not get better with medicine. Get help right away if:  You have a fever or chills.  You develop severe pain.  You develop new abdominal pain.  You faint.  You are unable to urinate. This information is not intended to replace advice given to you by your health care provider. Make sure you discuss any questions you have with your health care provider. Document Released: 05/25/2005 Document Revised: 12/13/2015 Document Reviewed: 11/08/2015 Elsevier Interactive Patient Education  2018 [employer].    Dietary Guidelines to Help Prevent Kidney Stones Kidney stones are deposits of minerals and salts that form inside your kidneys. Your risk of developing kidney stones may be greater depending on your diet, your lifestyle, the medicines you take, and whether you have certain medical conditions. Most people can reduce their chances of developing kidney stones by following the instructions below. Depending on your overall health and the type of kidney stones you tend to develop, your dietitian may give you more specific instructions. What are tips for following this plan? Reading food labels  Choose foods with "no salt added" or "low-salt" labels. Limit your sodium intake to less than 1500 mg per day.  Choose foods with calcium for each meal and snack. Try to eat about 300 mg of calcium at each meal. Foods that contain 200-500 mg of calcium per  serving include: ? 8 oz (237 ml) of milk, fortified nondairy milk, and fortified fruit juice. ? 8 oz (237 ml) of kefir, yogurt, and soy yogurt. ? 4 oz (118 ml) of tofu. ? 1 oz of cheese. ? 1 cup (300 g) of dried figs. ? 1 cup (91 g) of cooked broccoli. ? 1-3 oz can of sardines or mackerel.  Most people need 1000 to 1500 mg of calcium each day. Talk to your dietitian about how much calcium is recommended for you. Shopping  Buy plenty of fresh fruits and vegetables. Most people do not need to avoid fruits and vegetables, even if they contain nutrients that may contribute to kidney stones.  When shopping for convenience foods, choose: ? Whole pieces of fruit. ? Premade salads with dressing on the side. ? Low-fat fruit and yogurt smoothies.  Avoid buying frozen meals or prepared deli foods.  Look for foods with live cultures, such as yogurt and kefir. Cooking  Do not add salt to food when cooking. Place a salt shaker on the table and allow each person to add his or her own salt to taste.  Use vegetable protein,  such as beans, textured vegetable protein (TVP), or tofu instead of meat in pasta, casseroles, and soups. Meal planning  Eat less salt, if told by your dietitian. To do this: ? Avoid eating processed or premade food. ? Avoid eating fast food.  Eat less animal protein, including cheese, meat, poultry, or fish, if told by your dietitian. To do this: ? Limit the number of times you have meat, poultry, fish, or cheese each week. Eat a diet free of meat at least 2 days a week. ? Eat only one serving each day of meat, poultry, fish, or seafood. ? When you prepare animal protein, cut pieces into small portion sizes. For most meat and fish, one serving is about the size of one deck of cards.  Eat at least 5 servings of fresh fruits and vegetables each day. To do this: ? Keep fruits and vegetables on hand for snacks. ? Eat 1 piece of fruit or a handful of berries with  breakfast. ? Have a salad and fruit at lunch. ? Have two kinds of vegetables at dinner.  Limit foods that are high in a substance called oxalate. These include: ? Spinach. ? Rhubarb. ? Beets. ? Potato chips and french fries. ? Nuts.  If you regularly take a diuretic medicine, make sure to eat at least 1-2 fruits or vegetables high in potassium each day. These include: ? Avocado. ? Banana. ? Orange, prune, carrot, or tomato juice. ? Baked potato. ? Cabbage. ? Beans and split peas. General instructions  Drink enough fluid to keep your urine clear or pale yellow. This is the most important thing you can do.  Talk to your health care provider and dietitian about taking daily supplements. Depending on your health and the cause of your kidney stones, you may be advised: ? Not to take supplements with vitamin C. ? To take a calcium supplement. ? To take a daily probiotic supplement. ? To take other supplements such as magnesium, fish oil, or vitamin B6.  Take all medicines and supplements as told by your health care provider.  Limit alcohol intake to no more than 1 drink a day for nonpregnant women and 2 drinks a day for men. One drink equals 12 oz of beer, 5 oz of wine, or 1 oz of hard liquor.  Lose weight if told by your health care provider. Work with your dietitian to find strategies and an eating plan that works best for you. What foods are not recommended? Limit your intake of the following foods, or as told by your dietitian. Talk to your dietitian about specific foods you should avoid based on the type of kidney stones and your overall health. Grains Breads. Bagels. Rolls. Baked goods. Salted crackers. Cereal. Pasta. Vegetables Spinach. Rhubarb. Beets. Canned vegetables. Angie Fava. Olives. Meats and other protein foods Nuts. Nut butters. Large portions of meat, poultry, or fish. Salted or cured meats. Deli meats. Hot dogs. Sausages. Dairy Cheese. Beverages Regular soft  drinks. Regular vegetable juice. Seasonings and other foods Seasoning blends with salt. Salad dressings. Canned soups. Soy sauce. Ketchup. Barbecue sauce. Canned pasta sauce. Casseroles. Pizza. Lasagna. Frozen meals. Potato chips. [place] fries. Summary  You can reduce your risk of kidney stones by making changes to your diet.  The most important thing you can do is drink enough fluid. You should drink enough fluid to keep your urine clear or pale yellow.  Ask your health care provider or dietitian how much protein from animal sources you should eat each  day, and also how much salt and calcium you should have each day. This information is not intended to replace advice given to you by your health care provider. Make sure you discuss any questions you have with your health care provider. Document Released: 09/19/2010 Document Revised: 05/05/2016 Document Reviewed: 05/05/2016 Elsevier Interactive Patient Education  [employer].

## 2018-05-03 DIAGNOSIS — N200 Calculus of kidney: Secondary | ICD-10-CM | POA: Diagnosis not present

## 2018-05-03 DIAGNOSIS — M545 Low back pain: Secondary | ICD-10-CM | POA: Diagnosis not present

## 2018-05-03 DIAGNOSIS — R319 Hematuria, unspecified: Secondary | ICD-10-CM | POA: Diagnosis not present

## 2018-05-04 DIAGNOSIS — N201 Calculus of ureter: Secondary | ICD-10-CM | POA: Diagnosis not present

## 2018-05-04 DIAGNOSIS — N200 Calculus of kidney: Secondary | ICD-10-CM | POA: Diagnosis not present

## 2018-05-04 DIAGNOSIS — R1084 Generalized abdominal pain: Secondary | ICD-10-CM | POA: Diagnosis not present

## 2018-05-07 ENCOUNTER — Other Ambulatory Visit: Payer: Self-pay | Admitting: Internal Medicine

## 2018-05-13 DIAGNOSIS — R1084 Generalized abdominal pain: Secondary | ICD-10-CM | POA: Diagnosis not present

## 2018-05-13 DIAGNOSIS — N201 Calculus of ureter: Secondary | ICD-10-CM | POA: Diagnosis not present

## 2018-05-13 DIAGNOSIS — R8271 Bacteriuria: Secondary | ICD-10-CM | POA: Diagnosis not present

## 2018-05-17 DIAGNOSIS — N201 Calculus of ureter: Secondary | ICD-10-CM | POA: Diagnosis not present

## 2018-06-06 ENCOUNTER — Encounter: Payer: BLUE CROSS/BLUE SHIELD | Admitting: Internal Medicine

## 2018-06-07 ENCOUNTER — Telehealth: Payer: BLUE CROSS/BLUE SHIELD | Admitting: Physician Assistant

## 2018-06-07 DIAGNOSIS — J00 Acute nasopharyngitis [common cold]: Secondary | ICD-10-CM

## 2018-06-07 MED ORDER — IPRATROPIUM BROMIDE 0.06 % NA SOLN: 2.0000 | Freq: Four times a day (QID) | NASAL | 12 refills | Status: DC

## 2018-06-07 MED ORDER — BENZONATATE 100 MG PO CAPS: 100.0000 mg | ORAL_CAPSULE | Freq: Three times a day (TID) | ORAL | 0 refills | Status: DC

## 2018-06-07 NOTE — Progress Notes (Signed)
We are sorry you are not feeling well.  Here is how we plan to help!  Based on what you have shared with me, it looks like you may have a viral upper respiratory infection or a "common cold".  Colds are caused by a large number of viruses; however, rhinovirus is the most common cause.   Symptoms of the common cold vary from person to person, with common symptoms including sore throat, cough, and malaise.  A low-grade fever of 100.4 may present, but is often uncommon.  Symptoms vary however, and are closely related to a person's age or underlying illnesses.  The most common symptoms associated with the common cold are nasal discharge or congestion, cough, sneezing, headache and pressure in the ears and face.  Cold symptoms usually persist for about 3 to 10 days, but can last up to 2 weeks.  It is important to know that colds do not cause serious illness or complications in most cases.    The common cold is transmitted from person to person, with the most common method of transmission being a person's hands.  The virus is able to live on the skin and can infect other persons for up to 2 hours after direct contact.  Also, colds are transmitted when someone coughs or sneezes; thus, it is important to cover the mouth to reduce this risk.  To keep the spread of the common cold at [place], good hand hygiene is very important.  This is an infection that is most likely caused by a virus. There are no specific treatments for the common cold other than to help you with the symptoms until the infection runs its course.    For nasal congestion, you may use an oral decongestants such as Mucinex D or if you have glaucoma or high blood pressure use plain Mucinex.  Saline nasal spray or nasal drops can help and can safely be used as often as needed for congestion.  For your congestion, I have prescribed Ipratropium Bromide nasal spray 0.03% two sprays in each nostril 2-3 times a day  If you do not have a history of heart  disease, hypertension, diabetes or thyroid disease, prostate/bladder issues or glaucoma, you may also use Sudafed to treat nasal congestion.  It is highly recommended that you consult with a pharmacist or your primary care physician to ensure this medication is safe for you to take.     If you have a cough, you may use cough suppressants such as Delsym and Robitussin.  If you have glaucoma or high blood pressure, you can also use Coricidin HBP.   For cough I have prescribed for you A prescription cough medication called Tessalon Perles 100 mg. You may take 1-2 capsules every 8 hours as needed for cough  If you have a sore or scratchy throat, use a saltwater gargle-  to  teaspoon of salt dissolved in a 4-ounce to 8-ounce glass of warm water.  Gargle the solution for approximately 15-30 seconds and then spit.  It is important not to swallow the solution.  You can also use throat lozenges/cough drops and Chloraseptic spray to help with throat pain or discomfort.  Warm or cold liquids can also be helpful in relieving throat pain.  For headache, pain or general discomfort, you can use Ibuprofen or Tylenol as directed.   Some authorities believe that zinc sprays or the use of Echinacea may shorten the course of your symptoms.   HOME CARE Only take medications as instructed  by your medical team. Be sure to drink plenty of fluids. Water is fine as well as fruit juices, sodas and electrolyte beverages. You may want to stay away from caffeine or alcohol. If you are nauseated, try taking small sips of liquids. How do you know if you are getting enough fluid? Your urine should be a pale yellow or almost colorless. Get rest. Taking a steamy shower or using a humidifier may help nasal congestion and ease sore throat pain. You can place a towel over your head and breathe in the steam from hot water coming from a faucet. Using a saline nasal spray works much the same way. Cough drops, hard candies and sore throat  lozenges may ease your cough. Avoid close contacts especially the very young and the elderly Cover your mouth if you cough or sneeze Always remember to wash your hands.   GET HELP RIGHT AWAY IF: You develop worsening fever. If your symptoms do not improve within 10 days You develop yellow or green discharge from your nose over 3 days. You have coughing fits You develop a severe head ache or visual changes. You develop shortness of breath, difficulty breathing or start having chest pain  Your symptoms persist after you have completed your treatment plan  MAKE SURE YOU  Understand these instructions. Will watch your condition. Will get help right away if you are not doing well or get worse.  Your e-visit answers were reviewed by a board certified advanced clinical practitioner to complete your personal care plan. Depending upon the condition, your plan could have included both over the counter or prescription medications. Please review your pharmacy choice. If there is a problem, you may call our nursing hot line at and have the prescription routed to another pharmacy. Your safety is important to [place]. If you have drug allergies check your prescription carefully.   You can use MyChart to ask questions about today's visit, request a non-urgent call back, or ask for a work or school excuse for 24 hours related to this e-Visit. If it has been greater than 24 hours you will need to follow up with your provider, or enter a new e-Visit to address those concerns. You will get an e-mail in the next two days asking about your experience.  I hope that your e-visit has been valuable and will speed your recovery. Thank you for using e-visits.      ===View-only below this line===   ----- Message -----    From: Leroy Libman    Sent: 06/07/2018 11:29 AM EST      To: E-Visit Mailing List Subject: E-Visit Submission: Sinus Problems  E-Visit Submission: Sinus  Problems --------------------------------  Question: Which of the following have you been experiencing? Answer:   Congested nose            Pain around the nose and face            Headache            Throat pain            Cough  Question: Have these symptoms significantly worsened over the last two to three days? Answer:   Yes  Question: Describe your sore throat: Answer:   When I cough me throat really hurts.  Question: How long have you had a sore throat? Answer:   4 days  Question: Do you have any tenderness or swelling in your neck? Answer:   Yes  Question: Have you had any  of the following? Answer:   None of the above  Question: How long have you been having these symptoms? Answer:   5 days  Question: Do you have a fever? Answer:   No, I do not have a fever  Question: Do you smoke? Answer:   No  Question: Have you ever smoked? Answer:   I have never smoked  Question: Do you have any chronic illnesses, such as diabetes, heart disease, kidney disease, or lung disease, or any illness that would weaken your body's ability to fight infection? Answer:   No  Question: When you blow your nose, what color is the mucus? Answer:   Mostly thick and yellow or green  Question: Have you experienced similar problems in the past? Answer:   Yes  Question: What treatments have worked in the past?  Answer:   Mucinex  Question: What treatment(s) in the past have been unsuccessful? Answer:     Question: Is this illness similar to previous illnesses you have had?  How is it the same?  How is it different? Answer:   I think its a sinus infection  Question: Have you recently been hospitalized? Answer:   No  Question: What medications are you currently taking for these symptoms? Answer:   Nothing  Question: Please list your medication allergies that you may have ? (If 'none' , please list as 'none') Answer:   Septra             Amoxicillin  Question: Please list any  additional comments  Answer:   Just overall feel week, pain over eye.             I do have a wedding tonight and would like to feel better

## 2018-06-14 DIAGNOSIS — N201 Calculus of ureter: Secondary | ICD-10-CM | POA: Diagnosis not present

## 2018-07-01 ENCOUNTER — Encounter: Payer: Self-pay | Admitting: Internal Medicine

## 2018-07-01 ENCOUNTER — Ambulatory Visit: Payer: BLUE CROSS/BLUE SHIELD | Admitting: Internal Medicine

## 2018-07-01 VITALS — BP 132/66 | HR 102 | Temp 98.6°F | Resp 16 | Ht 67.0 in | Wt 160.4 lb

## 2018-07-01 DIAGNOSIS — R7303 Prediabetes: Secondary | ICD-10-CM

## 2018-07-01 DIAGNOSIS — H903 Sensorineural hearing loss, bilateral: Secondary | ICD-10-CM

## 2018-07-01 DIAGNOSIS — Z1239 Encounter for other screening for malignant neoplasm of breast: Secondary | ICD-10-CM | POA: Diagnosis not present

## 2018-07-01 DIAGNOSIS — N200 Calculus of kidney: Secondary | ICD-10-CM

## 2018-07-01 DIAGNOSIS — L301 Dyshidrosis [pompholyx]: Secondary | ICD-10-CM | POA: Diagnosis not present

## 2018-07-01 DIAGNOSIS — J069 Acute upper respiratory infection, unspecified: Secondary | ICD-10-CM

## 2018-07-01 MED ORDER — BENZONATATE 100 MG PO CAPS: 100.0000 mg | ORAL_CAPSULE | Freq: Three times a day (TID) | ORAL | 0 refills | Status: DC

## 2018-07-01 MED ORDER — OSELTAMIVIR PHOSPHATE 75 MG PO CAPS: 75.0000 mg | ORAL_CAPSULE | Freq: Every day | ORAL | 0 refills | Status: DC

## 2018-07-01 MED ORDER — CLOBETASOL PROPIONATE 0.05 % EX OINT: 1.0000 "application " | TOPICAL_OINTMENT | Freq: Two times a day (BID) | CUTANEOUS | 0 refills | Status: DC

## 2018-07-01 MED ORDER — PREDNISONE 10 MG PO TABS: ORAL_TABLET | ORAL | 0 refills | Status: DC

## 2018-07-01 NOTE — Progress Notes (Signed)
Subjective:  Patient ID: Dawn Russell, female    DOB: 1965-08-17  Age: 52 y.o. MRN: [id number]  CC: The primary encounter diagnosis was Breast cancer screening. Diagnoses of Sensorineural hearing loss (SNHL) of both ears, Prediabetes, Vesicular palmoplantar eczema of hand, Calcium oxalate renal calculi, and Viral upper respiratory tract infection were also pertinent to this visit.  HPI Dawn Russell presents for follow up on multiple issues.  Last seen in June,  Left sided Renal calculi diagnosed in May 15, 2023 . Has passed the stone since then but has developed another one that is measuring 5 mm . Seeing Urology,,   Joaquim Lai analysis reportedly done.  Currently pain free,      Has had symptoms of URI  Since Dec 31.  Symptoms initially improved with supportive care, but then returned fa few days ago, along with a worsening of a chronic pruritic  rash on her chest wall and throat  For the past 2 days     She has also developed a recurrent scaling/peeling  rash on lateral  sides of  Her palms    Outpatient Medications Prior to Visit  Medication Sig Dispense Refill  . Cholecalciferol (VITAMIN D3) 1000 units CAPS Take 1,000 Units by mouth.    . losartan (COZAAR) 50 MG tablet TAKE 1 TABLET DAILY 90 tablet 4  . VENTOLIN HFA 108 (90 BASE) MCG/ACT inhaler INHALE TWO PUFFS 4 TIMES DAILY AS NEEDED FOR  WHEEZING 18 g PRN  . Probiotic Product (PROBIOTIC-10 PO) Take by mouth.    . benzonatate (TESSALON) 100 MG capsule Take 1-2 capsules (100-200 mg total) by mouth 3 (three) times daily. (Patient not taking: Reported on 07/01/2018) 30 capsule 0  . HYDROcodone-acetaminophen (NORCO/VICODIN) 5-325 MG tablet Take 1 tablet by mouth every 6 (six) hours as needed for moderate pain.    Marland Kitchen ipratropium (ATROVENT) 0.06 % nasal spray Place 2 sprays into both nostrils 4 (four) times daily. (Patient not taking: Reported on 07/01/2018) 15 mL 12  . tamsulosin (FLOMAX) 0.4 MG CAPS capsule Take 0.4 mg by mouth.     No  facility-administered medications prior to visit.     Review of Systems;  Patient denies headache, fevers, malaise, unintentional weight loss, skin rash, eye pain, sinus congestion and sinus pain, sore throat, dysphagia,  hemoptysis , cough, dyspnea, wheezing, chest pain, palpitations, orthopnea, edema, abdominal pain, nausea, melena, diarrhea, constipation, flank pain, dysuria, hematuria, urinary  Frequency, nocturia, numbness, tingling, seizures,  Focal weakness, Loss of consciousness,  Tremor, insomnia, depression, anxiety, and suicidal ideation.      Objective:  BP 132/66 (BP Location: Left Arm, Patient Position: Sitting, Cuff Size: Normal)   Pulse (!) 102   Temp 98.6 F (37 C) (Oral)   Resp 16   Ht 5\' 7"  (1.702 m)   Wt 160 lb 6.4 oz (72.8 kg)   SpO2 97%   BMI 25.12 kg/m   BP Readings from Last 3 Encounters:  07/01/18 132/66  04/28/18 (!) 144/64  11/26/17 108/70    Wt Readings from Last 3 Encounters:  07/01/18 160 lb 6.4 oz (72.8 kg)  04/28/18 161 lb 12.8 oz (73.4 kg)  11/26/17 150 lb 6.4 oz (68.2 kg)    General appearance: alert, cooperative and appears stated age Ears: normal TM's and external ear canals both ears Throat: lips, mucosa, and tongue normal; teeth and gums normal Neck: no adenopathy, no carotid bruit, supple, symmetrical, trachea midline and thyroid not enlarged, symmetric, no tenderness/mass/nodules Back: symmetric, no curvature. ROM normal.  No CVA tenderness. Lungs: clear to auscultation bilaterally Heart: regular rate and rhythm, S1, S2 normal, no murmur, click, rub or gallop Abdomen: soft, non-tender; bowel sounds normal; no masses,  no organomegaly Pulses: 2+ and symmetric Skin: erythematous macular blanching rash on upper chest wall and anterior neck.   Lateral palms spotted with scaling peeling  Lymph nodes: Cervical, supraclavicular, and axillary nodes normal.  Lab Results  Component Value Date   HGBA1C 5.9 (H) 07/01/2018   HGBA1C 5.8  11/26/2017   HGBA1C 5.8 05/28/2017    Lab Results  Component Value Date   CREATININE 0.68 07/01/2018   CREATININE 0.66 11/26/2017   CREATININE 0.56 06/29/2017    Lab Results  Component Value Date   WBC 10.8 (H) 09/08/2017   HGB 14.4 09/08/2017   HCT 42.2 09/08/2017   PLT 261.0 09/08/2017   GLUCOSE 109 (H) 07/01/2018   CHOL 194 11/26/2017   TRIG 66.0 11/26/2017   HDL 69.40 11/26/2017   LDLDIRECT 141.8 12/14/2012   LDLCALC 112 (H) 11/26/2017   ALT 33 (H) 07/01/2018   AST 21 07/01/2018   NA 141 07/01/2018   K 4.4 07/01/2018   CL 103 07/01/2018   CREATININE 0.68 07/01/2018   BUN 20 07/01/2018   CO2 28 07/01/2018   TSH 1.13 05/26/2016   HGBA1C 5.9 (H) 07/01/2018    Ct Renal Stone Study  Addendum Date: 09/08/2017   ADDENDUM REPORT: 09/08/2017 10:49 ADDENDUM: The original report was by Dr. Van Clines. The following addendum is by Dr. Van Clines: These results were called by telephone at the time of interpretation on 09/08/2017 at 10:40 am to Dr. Deborra Medina , who verbally acknowledged these results. Electronically Signed   By: Van Clines M.D.   On: 09/08/2017 10:49   Result Date: 09/08/2017 CLINICAL DATA:  Right lower quadrant abdominal pain and flank pain. Microscopic hematuria. EXAM: CT ABDOMEN AND PELVIS WITHOUT CONTRAST TECHNIQUE: Multidetector CT imaging of the abdomen and pelvis was performed following the standard protocol without IV contrast. COMPARISON:  Multiple exams, including 12/11/2010 and ultrasound of 12/22/2012 FINDINGS: Lower chest: Unremarkable Hepatobiliary: Cholecystectomy.  Otherwise unremarkable. Pancreas: Unremarkable Spleen: Unremarkable Adrenals/Urinary Tract: Hypodense 1.7 cm right kidney upper pole lesion on image 30/2 is technically nonspecific with internal density of 16 Hounsfield units and faint posterior calcification along its margin. A smaller 0.8 cm cystic lesion was present in this vicinity on 12/11/2010. Similarly in indistinct  left mid kidney anterior hypodense lesion measures 1.4 by 1.2 cm, internal density 14 Hounsfield units, there is previously a cyst in this location on prior contrast enhanced workup. Left kidney lower pole 5 mm nonobstructive renal calculus, image 91/4. 1-2 mm left mid kidney nonobstructive left mid kidney calculus. No hydronephrosis, hydroureter, or appreciable ureteral calculus. Adrenal glands normal. Stomach/Bowel: Mildly thickened appendix at 8 mm with very faint periappendiceal stranding, suspicious for early acute appendicitis. Vascular/Lymphatic: Unremarkable Reproductive: Unremarkable Other: No supplemental non-categorized findings. Musculoskeletal: Unremarkable IMPRESSION: 1. Mildly but abnormally thickened appendix at 8 mm. In the context of the patient's recent right lower quadrant abdominal pain the appearance is suspicious for acute appendicitis. 2. Nonobstructive left nephrolithiasis. 3. Small hypodense renal lesions are the site of prior cysts shown on the 12/11/2010 exam and probably represent the same cysts slightly larger. Radiology assistant personnel have been notified to put me in telephone contact with the referring physician or the referring physician's clinical representative in order to discuss these findings. Once this communication is established I will issue an addendum to this  report for documentation purposes. Electronically Signed: By: Van Clines M.D. On: 09/08/2017 10:28    Assessment & Plan:   Problem List Items Addressed This Visit    Calcium oxalate renal calculi    Advised to increase water and daily citric acid intake       Prediabetes    A1c remains in the prediabetic range with weight loss and low carb diet.  Lab Results  Component Value Date   HGBA1C 5.9 (H) 07/01/2018         Relevant Orders   Hemoglobin A1c (Completed)   Comprehensive metabolic panel (Completed)   URI (upper respiratory infection)    URI is most likely viral given the mild  HEENT Symptoms  And normal exam.  Continue oral and nasal decongestants,prn tylenol 650 mq 8 hrs for aches and pains,  Sinus flushes with Milta Deiters Med's rinse,  prednisone  taper for inflammation, and cough suppressant.   Advised to start an antibiotic only if symptoms of bacterial sinusitis occur, and advised to take probiotic if the antibiotic is taken,  For a minimum of 3 weeks.       Relevant Medications   oseltamivir (TAMIFLU) 75 MG capsule   Vesicular palmoplantar eczema of hand    Suggested by exam.  Steroid cream prescribed.  Advised to use gloves when working with soap and/or water.        Other Visit Diagnoses    Breast cancer screening    -  Primary   Sensorineural hearing loss (SNHL) of both ears       Relevant Orders   Ambulatory referral to Audiology      I have discontinued Zetta Bills. Turpin's HYDROcodone-acetaminophen, tamsulosin, and ipratropium. I am also having her start on predniSONE, clobetasol ointment, and oseltamivir. Additionally, I am having her maintain her VENTOLIN HFA, Vitamin D3, Probiotic Product (PROBIOTIC-10 PO), losartan, and benzonatate.  Meds ordered this encounter  Medications  . benzonatate (TESSALON) 100 MG capsule    Sig: Take 1-2 capsules (100-200 mg total) by mouth 3 (three) times daily.    Dispense:  30 capsule    Refill:  0  . predniSONE (DELTASONE) 10 MG tablet    Sig: 6 tablets on Day 1 , then reduce by 1 tablet daily until gone    Dispense:  21 tablet    Refill:  0  . clobetasol ointment (TEMOVATE) 0.05 %    Sig: Apply 1 application topically 2 (two) times daily. To palms    Dispense:  30 g    Refill:  0  . oseltamivir (TAMIFLU) 75 MG capsule    Sig: Take 1 capsule (75 mg total) by mouth daily.    Dispense:  10 capsule    Refill:  0    Medications Discontinued During This Encounter  Medication Reason  . benzonatate (TESSALON) 100 MG capsule Completed Course  . HYDROcodone-acetaminophen (NORCO/VICODIN) 5-325 MG tablet Completed  Course  . ipratropium (ATROVENT) 0.06 % nasal spray Completed Course  . tamsulosin (FLOMAX) 0.4 MG CAPS capsule Completed Course    Follow-up: No follow-ups on file.   Crecencio Mc, MD

## 2018-07-01 NOTE — Patient Instructions (Addendum)
For GYN follow up :   I RECOMMEND Dr. Vikki Ports Ward  [facility] in [place] , [place]  Dr.  Servando Salina  At [facility] in [place]     For the cough Dawn Russell URI : \Starting tomorrow start the Prednisone taper for 6 days: 6 tablets all at once on Day 1,  Then taper by 1 tablet daily until gone   Resume using the  cough capsules every 8 hours to reduce cough   Flush sinuses once daily with salien rinse   If no improvement ,  We will add an antibiotic     For your trip to [place]: Take the tamiflu with you to [place] and start if you develop symptoms of flu :   Fever Cough Body aches Nausea vomiting

## 2018-07-02 LAB — COMPREHENSIVE METABOLIC PANEL
AG Ratio: 1.8 (calc) (ref 1.0–2.5)
ALKALINE PHOSPHATASE (APISO): 80 U/L (ref 33–130)
ALT: 33 U/L — ABNORMAL HIGH (ref 6–29)
AST: 21 U/L (ref 10–35)
Albumin: 4.6 g/dL (ref 3.6–5.1)
BILIRUBIN TOTAL: 0.3 mg/dL (ref 0.2–1.2)
BUN: 20 mg/dL (ref 7–25)
CO2: 28 mmol/L (ref 20–32)
Calcium: 10.8 mg/dL — ABNORMAL HIGH (ref 8.6–10.4)
Chloride: 103 mmol/L (ref 98–110)
Creat: 0.68 mg/dL (ref 0.50–1.05)
Globulin: 2.6 g/dL (calc) (ref 1.9–3.7)
Glucose, Bld: 109 mg/dL — ABNORMAL HIGH (ref 65–99)
Potassium: 4.4 mmol/L (ref 3.5–5.3)
Sodium: 141 mmol/L (ref 135–146)
Total Protein: 7.2 g/dL (ref 6.1–8.1)

## 2018-07-02 LAB — HEMOGLOBIN A1C
Hgb A1c MFr Bld: 5.9 % of total Hgb — ABNORMAL HIGH (ref <5.7)
Mean Plasma Glucose: 123 (calc)
eAG (mmol/L): 6.8 (calc)

## 2018-07-03 DIAGNOSIS — L301 Dyshidrosis [pompholyx]: Secondary | ICD-10-CM | POA: Insufficient documentation

## 2018-07-03 NOTE — Assessment & Plan Note (Signed)
Advised to increase water and daily citric acid intake

## 2018-07-03 NOTE — Assessment & Plan Note (Addendum)
A1c remains in the prediabetic range with weight loss and low carb diet.  Lab Results  Component Value Date   HGBA1C 5.9 (H) 07/01/2018

## 2018-07-03 NOTE — Assessment & Plan Note (Signed)
URI is most likely viral given the mild HEENT Symptoms  And normal exam.  Continue oral and nasal decongestants,prn tylenol 650 mq 8 hrs for aches and pains,  Sinus flushes with Milta Deiters Med's rinse,  prednisone  taper for inflammation, and cough suppressant.   Advised to start an antibiotic only if symptoms of bacterial sinusitis occur, and advised to take probiotic if the antibiotic is taken,  For a minimum of 3 weeks.

## 2018-07-03 NOTE — Assessment & Plan Note (Signed)
Suggested by exam.  Steroid cream prescribed.  Advised to use gloves when working with soap and/or water.

## 2018-07-05 ENCOUNTER — Other Ambulatory Visit: Payer: Self-pay | Admitting: Internal Medicine

## 2018-07-05 DIAGNOSIS — N200 Calculus of kidney: Secondary | ICD-10-CM | POA: Diagnosis not present

## 2018-07-05 DIAGNOSIS — R109 Unspecified abdominal pain: Secondary | ICD-10-CM | POA: Diagnosis not present

## 2018-07-25 DIAGNOSIS — H93299 Other abnormal auditory perceptions, unspecified ear: Secondary | ICD-10-CM | POA: Diagnosis not present

## 2018-08-04 DIAGNOSIS — N200 Calculus of kidney: Secondary | ICD-10-CM | POA: Diagnosis not present

## 2018-09-02 ENCOUNTER — Other Ambulatory Visit: Payer: Self-pay | Admitting: Internal Medicine

## 2018-09-02 ENCOUNTER — Other Ambulatory Visit: Payer: Self-pay

## 2018-09-02 DIAGNOSIS — R7401 Elevation of levels of liver transaminase levels: Secondary | ICD-10-CM

## 2018-09-02 DIAGNOSIS — R740 Nonspecific elevation of levels of transaminase and lactic acid dehydrogenase [LDH]: Secondary | ICD-10-CM

## 2018-09-02 MED ORDER — ALBUTEROL SULFATE HFA 108 (90 BASE) MCG/ACT IN AERS: INHALATION_SPRAY | RESPIRATORY_TRACT | 3 refills | Status: DC

## 2018-09-12 ENCOUNTER — Telehealth: Payer: Self-pay

## 2018-09-12 NOTE — Telephone Encounter (Signed)
Copied from [place] [id number]. Topic: General - Other >> Sep 12, 2018  2:30 PM Wynetta Emery, [place] C wrote: Reason for CRM: pt was advised via my-chart to schedule labs. Please assist pt with scheduling.    CB: [phone]

## 2018-09-13 ENCOUNTER — Other Ambulatory Visit: Payer: BLUE CROSS/BLUE SHIELD

## 2018-09-13 ENCOUNTER — Other Ambulatory Visit: Payer: Self-pay

## 2018-09-13 DIAGNOSIS — R740 Nonspecific elevation of levels of transaminase and lactic acid dehydrogenase [LDH]: Secondary | ICD-10-CM | POA: Diagnosis not present

## 2018-09-13 DIAGNOSIS — R7401 Elevation of levels of liver transaminase levels: Secondary | ICD-10-CM

## 2018-09-14 LAB — CALCIUM, IONIZED: Calcium, Ion: 5.33 mg/dL (ref 4.8–5.6)

## 2018-09-14 LAB — COMPREHENSIVE METABOLIC PANEL
AG Ratio: 1.8 (calc) (ref 1.0–2.5)
ALT: 32 U/L — ABNORMAL HIGH (ref 6–29)
AST: 18 U/L (ref 10–35)
Albumin: 4.4 g/dL (ref 3.6–5.1)
Alkaline phosphatase (APISO): 84 U/L (ref 37–153)
BUN: 14 mg/dL (ref 7–25)
CO2: 26 mmol/L (ref 20–32)
Calcium: 9.8 mg/dL (ref 8.6–10.4)
Chloride: 104 mmol/L (ref 98–110)
Creat: 0.6 mg/dL (ref 0.50–1.05)
Globulin: 2.4 g/dL (calc) (ref 1.9–3.7)
Glucose, Bld: 111 mg/dL — ABNORMAL HIGH (ref 65–99)
Potassium: 3.7 mmol/L (ref 3.5–5.3)
Sodium: 141 mmol/L (ref 135–146)
Total Bilirubin: 0.4 mg/dL (ref 0.2–1.2)
Total Protein: 6.8 g/dL (ref 6.1–8.1)

## 2018-09-16 LAB — PTH, INTACT AND CALCIUM
Calcium: 9.9 mg/dL (ref 8.6–10.4)
PTH: 71 pg/mL — ABNORMAL HIGH (ref 14–64)

## 2018-09-17 DIAGNOSIS — E210 Primary hyperparathyroidism: Secondary | ICD-10-CM

## 2018-09-17 DIAGNOSIS — N200 Calculus of kidney: Secondary | ICD-10-CM

## 2018-09-18 DIAGNOSIS — E210 Primary hyperparathyroidism: Secondary | ICD-10-CM | POA: Insufficient documentation

## 2018-09-18 NOTE — Assessment & Plan Note (Signed)
With recurrent nephrolithiasis and intermittent hypercalcemia.  endocrine referral in process

## 2018-11-02 DIAGNOSIS — N200 Calculus of kidney: Secondary | ICD-10-CM | POA: Diagnosis not present

## 2018-11-11 ENCOUNTER — Other Ambulatory Visit: Payer: Self-pay

## 2018-11-15 ENCOUNTER — Other Ambulatory Visit: Payer: Self-pay

## 2018-11-15 ENCOUNTER — Encounter: Payer: Self-pay | Admitting: Internal Medicine

## 2018-11-15 ENCOUNTER — Ambulatory Visit: Payer: BC Managed Care – PPO | Admitting: Internal Medicine

## 2018-11-15 VITALS — BP 120/70 | HR 87 | Ht 67.0 in | Wt 167.0 lb

## 2018-11-15 DIAGNOSIS — E559 Vitamin D deficiency, unspecified: Secondary | ICD-10-CM

## 2018-11-15 DIAGNOSIS — E210 Primary hyperparathyroidism: Secondary | ICD-10-CM

## 2018-11-15 LAB — VITAMIN D 25 HYDROXY (VIT D DEFICIENCY, FRACTURES): VITD: 32.9 ng/mL (ref 30.00–100.00)

## 2018-11-15 NOTE — Progress Notes (Addendum)
Patient ID: Dawn Russell, female   DOB: [birth year], 53 y.o.   MRN: [id number]   HPI  Dawn Russell is a 53 y.o.-year-old female, referred by her PCP, Dr. Derrel Nip, for evaluation for hypercalcemia/hyperparathyroidism.  She had her first kidney stone in 2013 (had to have lithotripsy) and had another kidney stone recently (passed it and was analyzed by urology).  At that time, she was told that the stone was made of calcium and she also had a 24-hour urine collection showing a high urinary calcium. She is seeing Dr. Diona Fanti.  She was recently started on indapamide to reduce kidney stone incidence.  A 24-hour urine collection checked after she started on indapamide showed decreased calcium level.  I do not have these records but will try to obtain them from Dr. Alan Ripper office.  Pt was dx with hypercalcemia in 06/2018. I reviewed pt's pertinent labs:  Component     Latest Ref Rng & Units 09/13/2018  Calcium Ionized     4.8 - 5.6 mg/dL 5.33   Lab Results  Component Value Date   PTH 71 (H) 09/13/2018   CALCIUM 9.8 09/13/2018   CALCIUM 9.9 09/13/2018   CALCIUM 10.8 (H) 07/01/2018   CALCIUM 9.8 11/26/2017   CALCIUM 9.6 06/29/2017   CALCIUM 9.4 05/28/2017   CALCIUM 9.7 05/26/2016   CALCIUM 9.0 01/23/2014   CALCIUM 9.3 12/14/2012   CALCIUM 9.8 11/29/2012   No history of osteoporosis.  No previous DXA scans. No fractures or falls.   + h/o kidney stone x2 - latest: 05/2018: Joaquim Lai analysis: Carbonate apatite (calcium phosphate) (90%) calcium oxalate (10%) 09/08/2017: CT abdomen showing nonobstructive left nephrolithiasis.  No h/o CKD. Last BUN/Cr: Lab Results  Component Value Date   BUN 14 09/13/2018   BUN 20 07/01/2018   CREATININE 0.60 09/13/2018   CREATININE 0.68 07/01/2018   Pt is not on HCTZ.  + h/o vitamin D deficiency. Reviewed vit D levels: Lab Results  Component Value Date   VD25OH 21.06 (L) 11/26/2016   VD25OH 12.72 (L) 06/15/2016   Pt is on vitamin D 1000 units  daily, prev. On Ergocalciferol.  Pt does not have a FH of hypercalcemia, pituitary tumors, thyroid cancer, or osteoporosis.   Pt. also has a history of hypertension-on 100 mg Losartan.   She lost 30 lbs in 2018 - [employer].   1 of her sisters developed a brain tumor at 73 years old and died from it at 39 years old. Her mother has a history of thyroid disease (? Type), and has had thyroid surgery.   ROS: Constitutional: no weight gain/loss, no fatigue, no subjective hyperthermia/hypothermia Eyes: no blurry vision, no xerophthalmia ENT: no sore throat, no nodules palpated in throat, no dysphagia/odynophagia, no hoarseness, + hypoacusis Cardiovascular: no CP/SOB/palpitations/leg swelling Respiratory: no cough/SOB Gastrointestinal: no N/V/D/C Musculoskeletal: no muscle/joint aches Skin: no rashes Neurological: no tremors/numbness/tingling/dizziness Psychiatric: no depression/anxiety  Past Medical History:  Diagnosis Date  . Abnormal Pap smear    ASCUS  . Asthma    exercise and cold(weather) induced  . Concussion 06/24/14   improved  . Dyspareunia   . Fibrocystic breast changes   . GERD (gastroesophageal reflux disease)    in past  . History of renal calculi July 2012   s/p lithotripsy [place]  . Hypertension       . Kidney stones   . 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 Pelvic pain in female   . Pelvic relaxation   .  Vaginitis    Past Surgical History:  Procedure Laterality Date  . CESAREAN SECTION    . CHOLECYSTECTOMY    . COLONOSCOPY WITH PROPOFOL N/A 10/11/2015   Procedure: COLONOSCOPY WITH PROPOFOL;  Surgeon: Lucilla Lame, MD;  Location: [place];  Service: Endoscopy;  Laterality: N/A;  latex sensitivity  . OOPHORECTOMY Right 2004   due to large benign cyst,  elevated CA125  . POLYPECTOMY  10/11/2015   Procedure: POLYPECTOMY;  Surgeon: Lucilla Lame, MD;  Location: [place];  Service: Endoscopy;;    Social History   Socioeconomic History  . Marital status: Married    Spouse name: Not on file  . Number of children: 2  . Years of education: Not on file  . Highest education level: Not on file  Occupational History  .  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 Financial resource strain: Not on file  . Food insecurity:    Worry: Not on file    Inability: Not on file  . Transportation needs:    Medical: Not on file    Non-medical: Not on file  Tobacco Use  . Smoking status: Never Smoker  . Smokeless tobacco: Never Used  Substance and Sexual Activity  . Alcohol use: No  . Drug use: No  . Sexual activity: Yes    Birth control/protection: Surgical    Comment: vas.  Lifestyle  . Physical activity:    Days per week: Not on file    Minutes per session: Not on file  . Stress: Not on file  Relationships  . Social connections:    Talks on phone: Not on file    Gets together: Not on file    Attends religious service: Not on file    Active member of club or organization: Not on file    Attends meetings of clubs or organizations: Not on file    Relationship status: Not on file  . Intimate partner violence:    Fear of current or ex partner: Not on file    Emotionally abused: Not on file    Physically abused: Not on file    Forced sexual activity: Not on file  Other Topics Concern  . Not on file  Social History Narrative   Married    Current Outpatient Medications on File Prior to Visit  Medication Sig Dispense Refill  . albuterol (VENTOLIN HFA) 108 (90 Base) MCG/ACT inhaler INHALE TWO PUFFS 4 TIMES DAILY AS NEEDED FOR  WHEEZING 18 g 3  . Cholecalciferol (VITAMIN D3) 1000 units CAPS Take 1,000 Units by mouth.    . losartan (COZAAR) 50 MG tablet TAKE 1 TABLET DAILY 90 tablet 4  . Probiotic Product (PROBIOTIC-10 PO) Take by mouth.    . clobetasol ointment (TEMOVATE) 3.54 % Apply 1 application topically 2 (two) times daily. To palms (Patient not taking: Reported on  11/15/2018) 30 g 0  . indapamide (LOZOL) 1.25 MG tablet 1.25 mg.     No current facility-administered medications on file prior to visit.    Allergies  Allergen Reactions  . Amoxicillin Rash  . Latex Rash    Burning after internal ultrasound  . Monistat [Miconazole] Rash    Itching and burning  . Septra [Bactrim] Rash  . Sulfa Antibiotics Rash   Family History  Problem Relation Age of Onset  . Hypertension Father   . Stroke Father   . Hypertension Mother   . Hyperlipidemia Mother   . Cancer Sister  brain tumor    PE: BP 120/70   Pulse 87   Ht 5\' 7"  (1.702 m)   Wt 167 lb (75.8 kg)   SpO2 98%   BMI 26.16 kg/m  Wt Readings from Last 3 Encounters:  11/15/18 167 lb (75.8 kg)  07/01/18 160 lb 6.4 oz (72.8 kg)  04/28/18 161 lb 12.8 oz (73.4 kg)   Constitutional: Slightly overweight, in NAD. No kyphosis. Eyes: PERRLA, EOMI, no exophthalmos ENT: moist mucous membranes, no thyromegaly, no cervical lymphadenopathy Cardiovascular: RRR, No MRG Respiratory: CTA B Gastrointestinal: abdomen soft, NT, ND, BS+ Musculoskeletal: no deformities, strength intact in all 4 Skin: moist, warm, no rashes Neurological: no tremor with outstretched hands, DTR normal in all 4  Assessment: 1. Hypercalcemia/hyperparathyroidism  2.  Vitamin D deficiency  Plan: Patient has had 1 instance of elevated calcium, at 10.8. A subsequent intact PTH level was also found to be high high, at 71, for a calcium of 9.8.  She was also found to have an elevated 24-hour urine calcium in the urologist office (I will need to obtain these records). - Patient also  has vitamin D deficiency,  with the last level being 21 in 2018.  - She does have complications from hypercalcemia: + nephrolithiasis x2, but no osteoporosis, no fractures. No abdominal pain, depression, bone pain. - I discussed with the patient about the physiology of calcium and parathyroid hormone, and possible side effects from increased PTH,  including kidney stones, osteoporosis, abdominal pain, etc.  - We discussed that we need to check whether her hyperparathyroidism is primary (Familial hypercalcemic hypocalciuria or parathyroid adenoma) or secondary (to conditions like: vitamin D deficiency, calcium malabsorption, hypercalciuria, renal insufficiency, etc.). - I discussed with her that we first need to bring her vitamin D level to normal so we can further investigate the parathyroid status. I explained that in the setting of a low vitamin D, the parathyroid hormone can be elevated, which is not a pathologic finding. However, if the PTH is elevated in the setting of a normal vitamin D, we will further need to investigate her for primary or secondary hyperparathyroidism. - after we normalize the vitamin D level, we'll need to check: calcium level intact PTH ([facility]) Magnesium Phosphorus vitamin D- 25 HO and 1,25 HO - We discussed possible consequences of hyperparathyroidism: ~1/3 pts will develop complications over 15 years (OP, nephrolithiasis).  - If the tests indicate a parathyroid adenoma, she agrees with a referral to surgery.  - Criteria for parathyroid surgery are:  . Increased calcium by more than 1 mg/dL above the upper limit of normal  . Kidney ds.  . Osteoporosis (or vertebral fracture) . Age <45 years old 2013 criteria: . High UCa >400 mg/d and increased stone risk by biochemical stone risk analysis . Presence of nephrolithiasis or nephrocalcinosis . Pt's preference!  - We may need to check a DXA scan to see if she has osteoporosis (+ add a 33% distal radius for evaluation of cortical bone, which is predominantly affected by hyperparathyroidism).  - I will advise her about vitamin D supplement dose when the results of the vitamin D level are back. - I will see the patient back in 4 months  2.  Vitamin D deficiency -She had a very low vitamin D level in 2018 and she was on ergocalciferol at that time.  After this,  she continued with over-the-counter 1000 units vitamin D daily. -We will recheck her vitamin D level today and may need to increase her  dose  CC: Dr Diona Fanti  Office Visit on 11/15/2018  Component Date Value Ref Range Status  . VITD 11/15/2018 32.90  30.00 - 100.00 ng/mL Final   Vitamin D normal.  We will go ahead and stop indapamide for 2 to 3 weeks and have her back for parathyroid labs.  Addendum: 11/30/2018: Received records from Dr. Diona Fanti: Patient had elevated urine calcium in 24-hour urine collection (08/04/2018) at 452.  After starting HCTZ, it  decreased to 234 (11/02/2018)  Philemon Kingdom, MD PhD [facility] Endocrinology

## 2018-11-15 NOTE — Patient Instructions (Addendum)
Please stop at the lab.  Please continue vitamin D 1000 units daily.  Please come back for a follow-up appointment in 4 months.   Hypercalcemia Hypercalcemia is having too much calcium in the blood. The body needs calcium to make bones and keep them strong. Calcium also helps the muscles, nerves, brain, and heart work the way they should. Most of the calcium in the body is in the bones. There is also some calcium in the blood. Hypercalcemia can happen when calcium comes out of the bones, or when the kidneys are not able to remove calcium from the blood. Hypercalcemia can be mild or severe. What are the causes? There are many possible causes of hypercalcemia. Common causes include:  Hyperparathyroidism. This is a condition in which the body produces too much parathyroid hormone. There are four parathyroid glands in your neck. These glands produce a chemical messenger (hormone) that helps the body absorb calcium from foods and helps your bones release calcium.  Certain kinds of cancer, such as lung cancer, breast cancer, or myeloma. Less common causes of hypercalcemia include:  Getting too much calcium or vitamin D from your diet.  Kidney failure.  Hyperthyroidism.  Being on bed rest for a long time.  Certain medicines.  Infections.  Sarcoidosis. What increases the risk? This condition is more likely to develop in:  Women.  People who are 60 years or older.  People who have a family history of hypercalcemia. What are the signs or symptoms? Mild hypercalcemia that starts slowly may not cause symptoms. Severe, sudden hypercalcemia is more likely to cause symptoms, such as:  Loss of appetite.  Increased thirst and frequent urination.  Fatigue.  Nausea and vomiting.  Headache.  Abdominal pain.  Muscle pain, twitching, or weakness.  Constipation.  Blood in the urine.  Pain in the side of the back (flank pain).  Anxiety, confusion, or depression.  Irregular  heartbeat (arrhythmia).  Loss of consciousness. How is this diagnosed? This condition may be diagnosed based on:  Your symptoms.  Blood tests.  Urine tests.  X-rays.  Ultrasound.  MRI.  CT scan. How is this treated? Treatment for hypercalcemia depends on the cause. Treatment may include:  Receiving fluids through an IV tube.  Medicines that keep calcium levels steady after receiving fluids (loop diuretics).  Medicines that keep calcium in your bones (bisphosphonates).  Medicines that lower the calcium level in your blood.  Surgery to remove overactive parathyroid glands. Follow these instructions at home:  Take over-the-counter and prescription medicines only as told by your health care provider.  Follow instructions from your health care provider about eating or drinking restrictions.  Drink enough fluid to keep your urine clear or pale yellow.  Stay active. Weight-bearing exercise helps to keep calcium in your bones. Follow instructions from your health care provider about what type and level of exercise is safe for you.  Keep all follow-up visits as told by your health care provider. This is important. Contact a health care provider if:  You have a fever.  You have flank or abdominal pain that is getting worse. Get help right away if:  You have severe abdominal or flank pain.  You have chest pain.  You have trouble breathing.  You become very confused and sleepy.  You lose consciousness. This information is not intended to replace advice given to you by your health care provider. Make sure you discuss any questions you have with your health care provider. Document Released: 08/08/2004 Document Revised:  10/31/2015 Document Reviewed: 10/10/2014 Elsevier Interactive Patient Education  2019 [employer].   Please continue vitamin D 1000 units.  Please return to see me in 4 months.

## 2018-11-18 DIAGNOSIS — N200 Calculus of kidney: Secondary | ICD-10-CM | POA: Diagnosis not present

## 2019-01-13 ENCOUNTER — Other Ambulatory Visit: Payer: Self-pay | Admitting: Internal Medicine

## 2019-01-13 DIAGNOSIS — E210 Primary hyperparathyroidism: Secondary | ICD-10-CM | POA: Diagnosis not present

## 2019-01-14 LAB — BASIC METABOLIC PANEL
BUN/Creatinine Ratio: 18 (ref 9–23)
BUN: 11 mg/dL (ref 6–24)
CO2: 24 mmol/L (ref 20–29)
Calcium: 10 mg/dL (ref 8.7–10.2)
Chloride: 98 mmol/L (ref 96–106)
Creatinine, Ser: 0.61 mg/dL (ref 0.57–1.00)
GFR calc Af Amer: 119 mL/min/{1.73_m2} (ref >59)
GFR calc non Af Amer: 103 mL/min/{1.73_m2} (ref >59)
Glucose: 145 mg/dL — ABNORMAL HIGH (ref 65–99)
Potassium: 4.1 mmol/L (ref 3.5–5.2)
Sodium: 137 mmol/L (ref 134–144)

## 2019-01-14 LAB — MAGNESIUM: Magnesium: 1.8 mg/dL (ref 1.6–2.3)

## 2019-01-14 LAB — PARATHYROID HORMONE, INTACT (NO CA): PTH: 29 pg/mL (ref 15–65)

## 2019-01-31 DIAGNOSIS — H905 Unspecified sensorineural hearing loss: Secondary | ICD-10-CM | POA: Insufficient documentation

## 2019-01-31 DIAGNOSIS — Z1231 Encounter for screening mammogram for malignant neoplasm of breast: Secondary | ICD-10-CM | POA: Diagnosis not present

## 2019-01-31 DIAGNOSIS — Z01419 Encounter for gynecological examination (general) (routine) without abnormal findings: Secondary | ICD-10-CM | POA: Diagnosis not present

## 2019-01-31 DIAGNOSIS — R87610 Atypical squamous cells of undetermined significance on cytologic smear of cervix (ASC-US): Secondary | ICD-10-CM | POA: Diagnosis not present

## 2019-01-31 DIAGNOSIS — Z6827 Body mass index (BMI) 27.0-27.9, adult: Secondary | ICD-10-CM | POA: Diagnosis not present

## 2019-01-31 DIAGNOSIS — Z124 Encounter for screening for malignant neoplasm of cervix: Secondary | ICD-10-CM | POA: Diagnosis not present

## 2019-01-31 DIAGNOSIS — L309 Dermatitis, unspecified: Secondary | ICD-10-CM | POA: Insufficient documentation

## 2019-03-14 ENCOUNTER — Telehealth: Payer: Self-pay | Admitting: *Deleted

## 2019-03-14 ENCOUNTER — Other Ambulatory Visit: Payer: Self-pay

## 2019-03-14 NOTE — Telephone Encounter (Signed)
Copied from [place] [id number]. Topic: Quick Communication - See Telephone Encounter >> Mar 14, 2019 11:04 AM [name] wrote: CRM for notification. See Telephone encounter for: 03/14/19. For Dawn Russell wanting  to let her know that Lambert Mody had died. Thought she would want to know.

## 2019-03-16 ENCOUNTER — Other Ambulatory Visit: Payer: Self-pay

## 2019-03-16 ENCOUNTER — Ambulatory Visit: Payer: BC Managed Care – PPO | Admitting: Internal Medicine

## 2019-03-16 VITALS — BP 130/80 | HR 88 | Ht 67.0 in | Wt 169.0 lb

## 2019-03-16 DIAGNOSIS — E210 Primary hyperparathyroidism: Secondary | ICD-10-CM

## 2019-03-16 DIAGNOSIS — E559 Vitamin D deficiency, unspecified: Secondary | ICD-10-CM

## 2019-03-16 DIAGNOSIS — N200 Calculus of kidney: Secondary | ICD-10-CM | POA: Diagnosis not present

## 2019-03-16 NOTE — Patient Instructions (Addendum)
Please stop at the lab.  Read the following books: Dr. Alyssa Grove - Program for Reversing Diabetes Rip Cyd Silence - The Engine 2 diet Dr. Alyssa Grove - PCRM   Please come back for a follow-up appointment in 6 months.

## 2019-03-16 NOTE — Progress Notes (Addendum)
Patient ID: Dawn Russell, female   DOB: January 21, 1966, 53 y.o.   MRN: [id number]   HPI  Dawn Russell is a 53 y.o.-year-old female, initially referred by her PCP, Dr. Derrel Nip, returning for follow-up for hypercalcemia/hyperparathyroidism.  Last visit 4 months ago.  Reviewed and addended history She had her first kidney stone in 2013 (had to have lithotripsy) and had another kidney stone recently (passed it and was analyzed by urology).  At that time, she was told that the stone was made of calcium and she also had a 24-hour urine collection showing a high urinary calcium. She is seeing Dr. Diona Fanti.  She was started on indapamide before our last visit  - to reduce kidney stone incidence.  A 24-hour urine collection checked after she started on indapamide showed decreased calcium level.   However, at last visit, to be able to further investigated her parathyroid status, we stopped indapamide.  Repeat calcium and PTH were normal.  No new kidney stones since last visit, however, she tells me that she does have 1 in the left kidney, unchanged.  Patient was diagnosed with hypercalcemia in 06/2018.  Reviewed pertinent labs:  Component     Latest Ref Rng & Units 09/13/2018  Calcium Ionized     4.8 - 5.6 mg/dL 5.33   Lab Results  Component Value Date   PTH 29 01/13/2019   PTH 71 (H) 09/13/2018   CALCIUM 10.0 01/13/2019   CALCIUM 9.8 09/13/2018   CALCIUM 9.9 09/13/2018   CALCIUM 10.8 (H) 07/01/2018   CALCIUM 9.8 11/26/2017   CALCIUM 9.6 06/29/2017   CALCIUM 9.4 05/28/2017   CALCIUM 9.7 05/26/2016   CALCIUM 9.0 01/23/2014   CALCIUM 9.3 12/14/2012   Component     Latest Ref Rng & Units 01/13/2019  Magnesium     1.6 - 2.3 mg/dL 1.8   11/30/2018: Records from Dr. Diona Fanti: Patient had elevated urine calcium in 24-hour urine collection (08/04/2018) at 452.  After starting HCTZ, it  decreased to 234 (11/02/2018)  No history of osteoporosis, fractures, falls.  No previous DXA scans.  + History of  kidney stones x2: 05/2018: [facility] analysis: Carbonate apatite (calcium phosphate) (90%) calcium oxalate (10%) 09/08/2017: CT abdomen showing nonobstructive left nephrolithiasis.  No history of CKD. Last BUN/Cr: Lab Results  Component Value Date   BUN 11 01/13/2019   BUN 14 09/13/2018   CREATININE 0.61 01/13/2019   CREATININE 0.60 09/13/2018   She is not on HCTZ or indapamide now.  She is trying to follow a low-sodium diet.  + History of vitamin D deficiency: Lab Results  Component Value Date   VD25OH 32.90 11/15/2018   VD25OH 21.06 (L) 11/26/2016   VD25OH 12.72 (L) 06/15/2016   She is on 1000 units vitamin D daily.  She was on ergocalciferol in the past.  Pt does not have a FH of hypercalcemia, pituitary tumors, thyroid cancer, or osteoporosis.   She is on losartan for high blood pressure..   She lost 30 lbs in 2018 - Weight [employer].   1 of her sisters developed a brain tumor at 65 years old and died from it at 69 years old. Her mother has a history of thyroid disease (? Type), and has had thyroid surgery.   ROS: Constitutional: no weight gain/no weight loss, no fatigue, no subjective hyperthermia, no subjective hypothermia Eyes: no blurry vision, no xerophthalmia ENT: no sore throat, no nodules palpated in neck, no dysphagia, no odynophagia, no hoarseness, + hypoacusis Cardiovascular: no CP/no  SOB/no palpitations/no leg swelling Respiratory: no cough/no SOB/no wheezing Gastrointestinal: no N/no V/no D/no C/no acid reflux Musculoskeletal: no muscle aches/no joint aches Skin: no rashes, no hair loss Neurological: no tremors/no numbness/no tingling/no dizziness  I reviewed pt's medications, allergies, PMH, social hx, family hx, and changes were documented in the history of present illness. Otherwise, unchanged from my initial visit note.  Past Medical History:  Diagnosis Date  . Abnormal Pap smear    ASCUS  . Asthma    exercise and cold(weather) induced  . Concussion  06/24/14   improved  . Dyspareunia   . Fibrocystic breast changes   . GERD (gastroesophageal reflux disease)    in past  . History of renal calculi July 2012   s/p lithotripsy [place]  . Hypertension       . Kidney stones   . 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 Pelvic pain in female   . Pelvic relaxation   . Vaginitis    Past Surgical History:  Procedure Laterality Date  . CESAREAN SECTION    . CHOLECYSTECTOMY    . COLONOSCOPY WITH PROPOFOL N/A 10/11/2015   Procedure: COLONOSCOPY WITH PROPOFOL;  Surgeon: Lucilla Lame, MD;  Location: [place];  Service: Endoscopy;  Laterality: N/A;  latex sensitivity  . OOPHORECTOMY Right 2004   due to large benign cyst,  elevated CA125  . POLYPECTOMY  10/11/2015   Procedure: POLYPECTOMY;  Surgeon: Lucilla Lame, MD;  Location: [place];  Service: Endoscopy;;   Social History   Socioeconomic History  . Marital status: Married    Spouse name: Not on file  . Number of children: 2  . Years of education: Not on file  . Highest education level: Not on file  Occupational History  .  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 Financial resource strain: Not on file  . Food insecurity:    Worry: Not on file    Inability: Not on file  . Transportation needs:    Medical: Not on file    Non-medical: Not on file  Tobacco Use  . Smoking status: Never Smoker  . Smokeless tobacco: Never Used  Substance and Sexual Activity  . Alcohol use: No  . Drug use: No  . Sexual activity: Yes    Birth control/protection: Surgical    Comment: vas.  Lifestyle  . Physical activity:    Days per week: Not on file    Minutes per session: Not on file  . Stress: Not on file  Relationships  . Social connections:    Talks on phone: Not on file    Gets together: Not on file    Attends religious service: Not on file    Active member of club or organization: Not on file    Attends meetings of clubs or  organizations: Not on file    Relationship status: Not on file  . Intimate partner violence:    Fear of current or ex partner: Not on file    Emotionally abused: Not on file    Physically abused: Not on file    Forced sexual activity: Not on file  Other Topics Concern  . Not on file  Social History Narrative   Married    Current Outpatient Medications on File Prior to Visit  Medication Sig Dispense Refill  . albuterol (VENTOLIN HFA) 108 (90 Base) MCG/ACT inhaler INHALE TWO PUFFS 4 TIMES DAILY AS NEEDED FOR  WHEEZING 18 g 3  .  Cholecalciferol (VITAMIN D3) 1000 units CAPS Take 1,000 Units by mouth.    . clobetasol ointment (TEMOVATE) [id number] % Apply 1 application topically 2 (two) times daily. To palms (Patient not taking: Reported on 11/15/2018) 30 g 0  . indapamide (LOZOL) 1.25 MG tablet 1.25 mg.    . losartan (COZAAR) 50 MG tablet TAKE 1 TABLET DAILY 90 tablet 4  . Probiotic Product (PROBIOTIC-10 PO) Take by mouth.     No current facility-administered medications on file prior to visit.    Allergies  Allergen Reactions  . Amoxicillin Rash  . Latex Rash    Burning after internal ultrasound  . Monistat [Miconazole] Rash    Itching and burning  . Septra [Bactrim] Rash  . Sulfa Antibiotics Rash   Family History  Problem Relation Age of Onset  . Hypertension Father   . Stroke Father   . Hypertension Mother   . Hyperlipidemia Mother   . Cancer Sister        brain tumor    PE: BP 130/80   Pulse 88   Ht 5\' 7"  (1.702 m) Comment: measured today without shoes  Wt 169 lb (76.7 kg)   SpO2 97%   BMI 26.47 kg/m  Wt Readings from Last 3 Encounters:  03/16/19 169 lb (76.7 kg)  11/15/18 167 lb (75.8 kg)  07/01/18 160 lb 6.4 oz (72.8 kg)   Constitutional: Slightly overweight, in NAD Eyes: PERRLA, EOMI, no exophthalmos ENT: moist mucous membranes, no thyromegaly, no cervical lymphadenopathy Cardiovascular: RRR, No MRG Respiratory: CTA B Gastrointestinal: abdomen soft, NT, ND,  BS+ Musculoskeletal: no deformities, strength intact in all 4 Skin: moist, warm, no rashes Neurological: no tremor with outstretched hands, DTR normal in all 4  Assessment: 1. Hypercalcemia/hyperparathyroidism  2.  Vitamin D deficiency  3.  Kidney stones  Plan: Patient with history of elevated calcium x1, at 10.8.  Subsequent intact PTH was slightly high, at 71, but a corresponding calcium at that time was 9.8.  She was found to have an elevated 24-hour urine calcium in the urologist office and started on indapamide.  To investigate her for primary hyperparathyroidism, we took her off indapamide at last visit.  She has been off now for the last 4 months.  She tells me that she would prefer to stay off this as she does not like to take medicines unless she absolutely has to. -After stopping indapamide, after last visit, calcium, intact PTH, magnesium, phosphorus, were all normal.  Therefore, we held off further intervention pending evaluation today. -Of note, she does have a history of nephrolithiasis x2, but no osteoporosis or fractures.  Also, no abdominal pain/depression/bone pain. -We again discussed about possible consequences of hyperparathyroidism (osteoporosis, kidney stones), but the incidence varies with the degree of hyperparathyroidism-for her, this is mild -We discussed that we will need to keep an eye on her PTH and calcium and if they increase now off indapamide, we may need to check localizing studies or a possible parathyroid adenoma -If the tests indicate a parathyroid adenoma, she does agree with referral to surgery -She meets criteria for parathyroid surgery (in bold: Marland Kitchen Increased calcium by more than 1 mg/dL above the upper limit of normal  . Kidney ds.  . Osteoporosis (or vertebral fracture) . Age <48 years old 2013 criteria: . High UCa >400 mg/d and increased stone risk by biochemical stone risk analysis . Presence of nephrolithiasis or nephrocalcinosis . Pt's  preference! -At this visit, we will check: Calcium level Intact PTH ([facility])  Vitamin D - We may need to check a DXA scan in the future (and 33% distal radius for evaluation of cortical bone, which is predominantly affected by hyperparathyroidism) - I will see the patient back in 6 months  2.  Vitamin D deficiency -Patient with history of  low vitamin D level in 2018 (21), previously on ergocalciferol, currently on 1000 units vitamin D daily -We checked a vitamin D level at last visit and this was normal, at 32.9.  We continued the same dose of vitamin D then. -We will recheck this today  3.  Kidney stones -Calcium oxalate per review of records from Dr. Diona Fanti -Per her preference, will continue off indapamide for now -Recommended a low-sodium diet that she is already trying to do so (uses No Salt spice) -I also advised her low acid diet and we discussed about how to achieve this.  Given examples of alkaline foods and strongly advised her to reduce as much as possible animal protein.  I advised her that this may also help with weight gain.  She tells me that she used to be on weight watchers and she ate a lot of meat and eggs on his diet and was able to lose 30 pounds, which, however, she gained back and would like to lose.   Patient Instructions  Please stop at the lab.  Read the following books: Dr. Alyssa Grove - Program for Reversing Diabetes Rip Cyd Silence - The Engine 2 diet Dr. Alyssa Grove - PCRM   Please come back for a follow-up appointment in 6 months.   CC: Dr Diona Fanti  Component     Latest Ref Rng & Units 03/22/2019  Glucose     65 - 99 mg/dL 127 (H)  BUN     7 - 25 mg/dL 14  Creatinine     0.50 - 1.05 mg/dL 0.55  GFR, Est Non African American     > OR = 60 mL/min/1.3m2 106  GFR, Est African American     > OR = 60 mL/min/1.23m2 123  BUN/Creatinine Ratio     6 - 22 (calc) NOT APPLICABLE  Sodium     [id number] - 146 mmol/L 140  Potassium     3.5 - 5.3 mmol/L  4.2  Chloride     98 - 110 mmol/L 105  CO2     20 - 32 mmol/L 26  Calcium     8.6 - 10.4 mg/dL 9.7  Phosphorus     2.5 - 4.5 mg/dL 3.0  Magnesium     1.5 - 2.5 mg/dL 2.0  PTH, Intact     14 - 64 pg/mL 64   Labs are normal including calcium, phosphorus, magnesium, PTH.  Calcitriol level is not back yet. We will repeat the labs at next visit.  Vitamin D 1, 25 (OH) Total     18 - 72 pg/mL 83 (H)  Vitamin D3 1, 25 (OH)     pg/mL 83  Vitamin D2 1, 25 (OH)     pg/mL <8  Calcitriol level is still slightly elevated.  Philemon Kingdom, MD PhD [facility] Endocrinology

## 2019-03-22 ENCOUNTER — Ambulatory Visit (INDEPENDENT_AMBULATORY_CARE_PROVIDER_SITE_OTHER): Payer: BC Managed Care – PPO

## 2019-03-22 ENCOUNTER — Other Ambulatory Visit: Payer: Self-pay

## 2019-03-22 DIAGNOSIS — Z23 Encounter for immunization: Secondary | ICD-10-CM | POA: Diagnosis not present

## 2019-03-22 DIAGNOSIS — E210 Primary hyperparathyroidism: Secondary | ICD-10-CM

## 2019-03-22 NOTE — Addendum Note (Signed)
Addended by: Leeanne Rio on: 03/22/2019 03:26 PM   Modules accepted: Orders

## 2019-03-23 ENCOUNTER — Encounter: Payer: Self-pay | Admitting: Internal Medicine

## 2019-03-23 LAB — PARATHYROID HORMONE, INTACT (NO CA): PTH: 64 pg/mL (ref 14–64)

## 2019-03-25 LAB — PHOSPHORUS: Phosphorus: 3 mg/dL (ref 2.5–4.5)

## 2019-03-25 LAB — MAGNESIUM: Magnesium: 2 mg/dL (ref 1.5–2.5)

## 2019-03-25 LAB — VITAMIN D 1,25 DIHYDROXY
Vitamin D 1, 25 (OH)2 Total: 83 pg/mL — ABNORMAL HIGH (ref 18–72)
Vitamin D2 1, 25 (OH)2: <8 pg/mL
Vitamin D3 1, 25 (OH)2: 83 pg/mL

## 2019-03-25 LAB — BASIC METABOLIC PANEL WITH GFR
BUN: 14 mg/dL (ref 7–25)
CO2: 26 mmol/L (ref 20–32)
Calcium: 9.7 mg/dL (ref 8.6–10.4)
Chloride: 105 mmol/L (ref 98–110)
Creat: 0.55 mg/dL (ref 0.50–1.05)
GFR, Est African American: 123 mL/min/{1.73_m2} (ref >=60)
GFR, Est Non African American: 106 mL/min/{1.73_m2} (ref >=60)
Glucose, Bld: 127 mg/dL — ABNORMAL HIGH (ref 65–99)
Potassium: 4.2 mmol/L (ref 3.5–5.3)
Sodium: 140 mmol/L (ref 135–146)

## 2019-05-08 ENCOUNTER — Ambulatory Visit: Payer: Self-pay

## 2019-05-08 NOTE — Telephone Encounter (Signed)
Pt's husband tested positive with sx and pt wants to test - Pt now having Took Mucinex twice and drinking lots of water. Advised pt to call PCP or go to [facility] for SOB at rest, wheezing, or a tightness to her chest. Informed pt to drink warm fluids and to suck on hard candy or cough drops. Advised pt to call 911 if having difficulty breathing or if dehydrated. Advised pt to call back if coughing worsens and if unable to sleep at night. Advised pt to now stay in self isolation and to wear a mask and if she has to go out to always wear a mask and maintain social distancing.  Advised pt that even if she tested positive that due to close contact she will need to be on 14 day quarantine. Advised pt to cover coughs and sneezes with a tissue or use the inside of her elbow. Advised pt to get rest and stay hydrated.  Informed pt of testing address in [place] and hours of operation.  Reason for Disposition . COVID-19 Testing, questions about  Answer Assessment - Initial Assessment Questions 1. COVID-19 DIAGNOSIS: "Who made your Coronavirus (COVID-19) diagnosis?" "Was it confirmed by a positive lab test?" If not diagnosed by a HCP, ask "Are there lots of cases (community spread) where you live?" (See public health department website, if unsure)     No test yet 2. COVID-19 EXPOSURE: "Was there any known exposure to COVID before the symptoms began?" CDC Definition of close contact: within 6 feet (2 meters) for a total of 15 minutes or more over a 24-hour period.      Yes husband 3. ONSET: "When did the COVID-19 symptoms start?"      yesterday 4. WORST SYMPTOM: "What is your worst symptom?" (e.g., cough, fever, shortness of breath, muscle aches)     Headache 5. COUGH: "Do you have a cough?" If so, ask: "How bad is the cough?"       Yes-occasional a dry cough 6. FEVER: "Do you have a fever?" If so, ask: "What is your temperature, how was it measured, and when did it start?"     Yes- 101 taken orally 7.  RESPIRATORY STATUS: "Describe your breathing?" (e.g., shortness of breath, wheezing, unable to speak)      H/o asthma  8. BETTER-SAME-WORSE: "Are you getting better, staying the same or getting worse compared to yesterday?"  If getting worse, ask, "In what way?"     same 9. HIGH RISK DISEASE: "Do you have any chronic medical problems?" (e.g., asthma, heart or lung disease, weak immune system, obesity, etc.)    asthma 10. PREGNANCY: "Is there any chance you are pregnant?" "When was your last menstrual period?"       no 11. OTHER SYMPTOMS: "Do you have any other symptoms?"  (e.g., chills, fatigue, headache, loss of smell or taste, muscle pain, sore throat; new loss of smell or taste especially support the diagnosis of COVID-19)       Fatigue headache, knees feel weak, head congestion, no SOB, sore throat  Protocols used: CORONAVIRUS (COVID-19) DIAGNOSED OR SUSPECTED-A-AH

## 2019-05-09 DIAGNOSIS — Z20828 Contact with and (suspected) exposure to other viral communicable diseases: Secondary | ICD-10-CM | POA: Diagnosis not present

## 2019-05-09 DIAGNOSIS — U071 COVID-19: Secondary | ICD-10-CM

## 2019-05-09 HISTORY — DX: COVID-19: U07.1

## 2019-05-11 DIAGNOSIS — Z20828 Contact with and (suspected) exposure to other viral communicable diseases: Secondary | ICD-10-CM | POA: Diagnosis not present

## 2019-05-12 NOTE — Telephone Encounter (Signed)
I called and spoke to pt about her husband, son and daughter covid positive.  Questions answered.  She is awaiting her trest results.  Discussed quarantine .

## 2019-07-31 ENCOUNTER — Other Ambulatory Visit: Payer: Self-pay | Admitting: Internal Medicine

## 2019-08-26 ENCOUNTER — Ambulatory Visit: Payer: BC Managed Care – PPO | Attending: Internal Medicine

## 2019-08-26 DIAGNOSIS — Z23 Encounter for immunization: Secondary | ICD-10-CM

## 2019-08-26 NOTE — Progress Notes (Signed)
   Covid-19 Vaccination Clinic  Name:  Dawn Russell    MRN: [id number] DOB: 03/03/1966  08/26/2019  Dawn Russell was observed post Covid-19 immunization for 15 minutes without incident. She was provided with Vaccine Information Sheet and instruction to access the V-Safe system.   Dawn Russell was instructed to call 911 with any severe reactions post vaccine: Marland Kitchen Difficulty breathing  . Swelling of face and throat  . A fast heartbeat  . A bad rash all over body  . Dizziness and weakness   Immunizations Administered    Name Date Dose VIS Date Route   Pfizer COVID-19 Vaccine 08/26/2019  2:01 PM 0.3 mL 05/19/2019 Intramuscular   Manufacturer: [place]   Lot: [id number]   [place]: [id number]

## 2019-09-13 ENCOUNTER — Other Ambulatory Visit: Payer: Self-pay

## 2019-09-15 ENCOUNTER — Encounter: Payer: Self-pay | Admitting: Internal Medicine

## 2019-09-15 ENCOUNTER — Other Ambulatory Visit: Payer: Self-pay

## 2019-09-15 ENCOUNTER — Ambulatory Visit (INDEPENDENT_AMBULATORY_CARE_PROVIDER_SITE_OTHER): Payer: BC Managed Care – PPO | Admitting: Internal Medicine

## 2019-09-15 VITALS — BP 130/90 | HR 88 | Ht 67.0 in | Wt 171.0 lb

## 2019-09-15 DIAGNOSIS — E559 Vitamin D deficiency, unspecified: Secondary | ICD-10-CM

## 2019-09-15 DIAGNOSIS — E210 Primary hyperparathyroidism: Secondary | ICD-10-CM

## 2019-09-15 LAB — VITAMIN D 25 HYDROXY (VIT D DEFICIENCY, FRACTURES): VITD: 25.74 ng/mL — ABNORMAL LOW (ref 30.00–100.00)

## 2019-09-15 NOTE — Progress Notes (Signed)
Patient ID: Dawn Russell, female   DOB: Jul 21, 1965, 54 y.o.   MRN: [id number]   This visit occurred during the SARS-CoV-2 public health emergency.  Safety protocols were in place, including screening questions prior to the visit, additional usage of staff PPE, and extensive cleaning of exam room while observing appropriate contact time as indicated for disinfecting solutions.   HPI  Dawn Russell is a 54 y.o.-year-old female, initially referred by her PCP, Dr. Derrel Nip, returning for follow-up for hypercalcemia/hyperparathyroidism.  Last visit 6 months ago.  She and her husband had Covid 19 in 04/2019.  She is still recovering from this.  She had only mild symptoms.  Reviewed and addended history She had her first kidney stone in 2013 (had to have lithotripsy) and had another kidney stone recently (passed it and was analyzed by urology).  At that time, she was told that the stone was made of calcium and she also had a 24-hour urine collection showing a high urinary calcium. She is seeing Dr. Diona Fanti.  She was started on indapamide before our last visit  - to reduce kidney stone incidence.  A 24-hour urine collection checked after she started on indapamide showed decreased calcium level.   However, at last visit, to be able to further investigated her parathyroid status, we stopped indapamide.  Repeat calcium and PTH were normal.  No new kidney stones since last visit, however, she tells me that she does have 1 in the left kidney, unchanged.  She had an elevated calcium in 06/2018.   Reviewed pertinent labs: Lab Results  Component Value Date   PTH 64 03/22/2019   PTH 29 01/13/2019   PTH 71 (H) 09/13/2018   CALCIUM 9.7 03/22/2019   CALCIUM 10.0 01/13/2019   CALCIUM 9.8 09/13/2018   CALCIUM 9.9 09/13/2018   CALCIUM 10.8 (H) 07/01/2018   CALCIUM 9.8 11/26/2017   CALCIUM 9.6 06/29/2017   CALCIUM 9.4 05/28/2017   CALCIUM 9.7 05/26/2016   CALCIUM 9.0 01/23/2014   Component     Latest  Ref Rng & Units 09/13/2018  Calcium Ionized     4.8 - 5.6 mg/dL 5.33   11/30/2018: Reviewed records from Dr. Diona Fanti: Patient had elevated urine calcium in 24-hour urine collection (08/04/2018) at 452.  After starting HCTZ, it  decreased to 234 (11/02/2018)  At last visit, however, calcium, PTH, phosphorus were normal, while calcitriol level was slightly high: Component     Latest Ref Rng & Units 03/22/2019  Glucose     65 - 99 mg/dL 127 (H)  BUN     7 - 25 mg/dL 14  Creatinine     0.50 - 1.05 mg/dL 0.55  GFR, Est Non African American     > OR = 60 mL/min/1.32m2 106  GFR, Est African American     > OR = 60 mL/min/1.67m2 123  BUN/Creatinine Ratio     6 - 22 (calc) NOT APPLICABLE  Sodium     [id number] - 146 mmol/L 140  Potassium     3.5 - 5.3 mmol/L 4.2  Chloride     98 - 110 mmol/L 105  CO2     20 - 32 mmol/L 26  Calcium     8.6 - 10.4 mg/dL 9.7  Phosphorus     2.5 - 4.5 mg/dL 3.0  Magnesium     1.5 - 2.5 mg/dL 2.0  PTH, Intact     14 - 64 pg/mL 64   Vitamin D 1, 25 (OH) Total  18 - 72 pg/mL 83 (H)  Vitamin D3 1, 25 (OH)     pg/mL 83  Vitamin D2 1, 25 (OH)     pg/mL <8   No history of osteoporosis, fractures, falls.  No previous DXA scans.  She has a history of kidney stones x2: 05/2018: [facility] analysis: Carbonate apatite (calcium phosphate) (90%) calcium oxalate (10%) 09/08/2017: CT abdomen showing nonobstructive left nephrolithiasis.  No CKD. Last BUN/Cr: Lab Results  Component Value Date   BUN 14 03/22/2019   BUN 11 01/13/2019   CREATININE 0.55 03/22/2019   CREATININE 0.61 01/13/2019   Not on HCTZ or indapamide now.  She is trying to follow-up low-sodium diet.  She has a history of vitamin D deficiency: Lab Results  Component Value Date   VD25OH 32.90 11/15/2018   VD25OH 21.06 (L) 11/26/2016   VD25OH 12.72 (L) 06/15/2016   Previously on ergocalciferol, currently on 1000 units vitamin D3 daily. She was off 1-2 weeks off, then 5000 units every week, now  1000 units daily.  Pt does not have a FH of hypercalcemia, pituitary tumors, thyroid cancer, or osteoporosis.  Mother had partial thyroidectomy (? Dx).  She lost 30 lbs in 2018 - Weight [employer].  At last visit we discussed about a plant-based diet and I gave her references.  She did not try these >> would like me to give these to her again.   1 of her sisters developed a brain tumor at 31 years old and died from it at 55 years old. Her mother has a history of thyroid disease (? Type), and has had thyroid surgery.   ROS: Constitutional: no weight gain/no weight loss, no fatigue, no subjective hyperthermia, no subjective hypothermia Eyes: no blurry vision, no xerophthalmia ENT: no sore throat, no nodules palpated in neck, no dysphagia, no odynophagia, no hoarseness Cardiovascular: no CP/no SOB/no palpitations/+ mild periankle swelling Respiratory: no cough/no SOB/no wheezing Gastrointestinal: no N/no V/no D/no C/no acid reflux Musculoskeletal: no muscle aches/no joint aches Skin: no rashes, no hair loss Neurological: no tremors/no numbness/no tingling/no dizziness  I reviewed pt's medications, allergies, PMH, social hx, family hx, and changes were documented in the history of present illness. Otherwise, unchanged from my initial visit note.  Past Medical History:  Diagnosis Date  . Abnormal Pap smear    ASCUS  . Asthma    exercise and cold(weather) induced  . Concussion 06/24/14   improved  . Dyspareunia   . Fibrocystic breast changes   . GERD (gastroesophageal reflux disease)    in past  . History of renal calculi July 2012   s/p lithotripsy Dahlstedt  . Hypertension       . Kidney stones   . 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 Pelvic pain in female   . Pelvic relaxation   . Vaginitis    Past Surgical History:  Procedure Laterality Date  . CESAREAN SECTION    . CHOLECYSTECTOMY    . COLONOSCOPY WITH PROPOFOL N/A 10/11/2015    Procedure: COLONOSCOPY WITH PROPOFOL;  Surgeon: Lucilla Lame, MD;  Location: [facility];  Service: Endoscopy;  Laterality: N/A;  latex sensitivity  . OOPHORECTOMY Right 2004   due to large benign cyst,  elevated CA125  . POLYPECTOMY  10/11/2015   Procedure: POLYPECTOMY;  Surgeon: Lucilla Lame, MD;  Location: [facility];  Service: Endoscopy;;   Social History   Socioeconomic History  . Marital status: Married    Spouse name: Not on  file  . Number of children: 2  . Years of education: Not on file  . Highest education level: Not on file  Occupational History  .  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 Financial resource strain: Not on file  . Food insecurity:    Worry: Not on file    Inability: Not on file  . Transportation needs:    Medical: Not on file    Non-medical: Not on file  Tobacco Use  . Smoking status: Never Smoker  . Smokeless tobacco: Never Used  Substance and Sexual Activity  . Alcohol use: No  . Drug use: No  . Sexual activity: Yes    Birth control/protection: Surgical    Comment: vas.  Lifestyle  . Physical activity:    Days per week: Not on file    Minutes per session: Not on file  . Stress: Not on file  Relationships  . Social connections:    Talks on phone: Not on file    Gets together: Not on file    Attends religious service: Not on file    Active member of club or organization: Not on file    Attends meetings of clubs or organizations: Not on file    Relationship status: Not on file  . Intimate partner violence:    Fear of current or ex partner: Not on file    Emotionally abused: Not on file    Physically abused: Not on file    Forced sexual activity: Not on file  Other Topics Concern  . Not on file  Social History Narrative   Married    Current Outpatient Medications on File Prior to Visit  Medication Sig Dispense Refill  . albuterol (VENTOLIN HFA) 108 (90 Base) MCG/ACT inhaler INHALE TWO PUFFS 4 TIMES DAILY AS NEEDED FOR   WHEEZING 18 g 3  . Cholecalciferol (VITAMIN D3) 1000 units CAPS Take 1,000 Units by mouth.    . clobetasol ointment (TEMOVATE) [id number] % Apply 1 application topically 2 (two) times daily. To palms (Patient not taking: Reported on 11/15/2018) 30 g 0  . indapamide (LOZOL) 1.25 MG tablet 1.25 mg.    . losartan (COZAAR) 50 MG tablet TAKE 1 TABLET DAILY 90 tablet 3  . Probiotic Product (PROBIOTIC-10 PO) Take by mouth.     No current facility-administered medications on file prior to visit.   Allergies  Allergen Reactions  . Amoxicillin Rash  . Latex Rash    Burning after internal ultrasound  . Monistat [Miconazole] Rash    Itching and burning  . Septra [Bactrim] Rash  . Sulfa Antibiotics Rash   Family History  Problem Relation Age of Onset  . Hypertension Father   . Stroke Father   . Hypertension Mother   . Hyperlipidemia Mother   . Cancer Sister        brain tumor    PE: BP 130/90   Pulse 88   Ht 5\' 7"  (1.702 m)   Wt 171 lb (77.6 kg)   SpO2 98%   BMI 26.78 kg/m  Wt Readings from Last 3 Encounters:  09/15/19 171 lb (77.6 kg)  03/16/19 169 lb (76.7 kg)  11/15/18 167 lb (75.8 kg)   Constitutional: Slightly  overweight, in NAD Eyes: PERRLA, EOMI, no exophthalmos ENT: moist mucous membranes, no thyromegaly, no cervical lymphadenopathy Cardiovascular: RRR, No MRG Respiratory: CTA B Gastrointestinal: abdomen soft, NT, ND, BS+ Musculoskeletal: no deformities, strength intact in all 4 Skin: moist, warm, no rashes Neurological: no tremor  with outstretched hands, DTR normal in all 4  Assessment: 1. Hypercalcemia/hyperparathyroidism  2.  Vitamin D deficiency  3.  Kidney stones  Plan: Patient with history of elevated calcium x1, at 10.8.  Subsequent intact PTH was slightly high, at 71, but with a corresponding calcium at that time of 9.8, normal.  She was found to have an elevated 24-hour urine calcium in the urologist office and started on indapamide.  To investigate her for  primary hyperparathyroidism, we took her off indapamide 11/2018.  At last visit she was telling me that she would prefer to stay off this medication.  We rechecked her calcium and PTH at last visit and they were normal.  Also, magnesium, phosphorus were also normal.  Her calcitriol level was slightly high.  She does have a history of nephrolithiasis x2, but no osteoporosis or fractures.  Also, no abdominal pain, depression, bone pain. -We again discussed about the possible consequences of hyperparathyroidism (osteoporosis, kidney stones, but the incidence varies with the degree of hyperparathyroidism, which however, is mild. -Since the labs were normal at last visit we discussed about cutting her back in 6 months and rechecking them.  If the tests points towards a parathyroid adenoma, she does agree with a referral to surgery. -She meets criteria for surgery (in bold below): Marland Kitchen Increased calcium by more than 1 mg/dL above the upper limit of normal  . Kidney ds.  . Osteoporosis (or vertebral fracture) . Age <15 years old 2013 criteria: . High UCa >400 mg/d and increased stone risk by biochemical stone risk analysis . Presence of nephrolithiasis or nephrocalcinosis . Pt's preference! -At this visit, we will check Calcium Intact PTH ([facility]) Vitamin D -We may need to check a DXA scan in the future (and 33% distal radius for evaluation of cortical bone, which is predominantly affected by hyperparathyroidism) - I will see the patient back in 6 months  2.  Vitamin D deficiency -Patient with history of  low vitamin D level in 2018 (21), previously on ergocalciferol, now on 1000 units vitamin D daily.  However, she was off the vitamin D completely for 1 week and then took 5000 units every week.  She restarted 1000 units daily approximately 2 weeks ago. -Plan to vitamin D level was normal last year. -We will recheck this today  CC: Dr Diona Fanti  Component     Latest Ref Rng & Units 09/15/2019           Calcium     8.7 - 10.2 mg/dL 10.5 (H)  PTH, Intact     15 - 65 pg/mL 36  PTH Interp      Comment  VITD     30.00 - 100.00 ng/mL 25.74 (L)   Calcium is slightly high now with a nonsuppressed PTH, but vitamin D is also slightly low.  We will increase her vitamin D to 2000 units daily and will have her back in 2 months for repeat labs.  Philemon Kingdom, MD PhD [facility] Endocrinology

## 2019-09-15 NOTE — Patient Instructions (Addendum)
Please stop at the lab.  Continue vitamin D 1000 units daily.  Read the following books: Dr. Alyssa Grove - Program for Reversing Diabetes Rip Cyd Silence - The Engine 2 diet Dr. Alyssa Grove [facility] [place] documentary   Please come back for a follow-up appointment in 6 months.

## 2019-09-16 LAB — PTH, INTACT AND CALCIUM
Calcium: 10.5 mg/dL — ABNORMAL HIGH (ref 8.7–10.2)
PTH: 36 pg/mL (ref 15–65)

## 2019-09-20 ENCOUNTER — Ambulatory Visit: Payer: BC Managed Care – PPO | Attending: Internal Medicine

## 2019-09-20 DIAGNOSIS — Z23 Encounter for immunization: Secondary | ICD-10-CM

## 2019-09-20 NOTE — Progress Notes (Signed)
   Covid-19 Vaccination Clinic  Name:  Dawn Russell    MRN: [id number] DOB: 07-Oct-1965  09/20/2019  [name] was observed post Covid-19 immunization for 15 minutes without incident. She was provided with Vaccine Information Sheet and instruction to access the V-Safe system.   [name] was instructed to call 911 with any severe reactions post vaccine: Marland Kitchen Difficulty breathing  . Swelling of face and throat  . A fast heartbeat  . A bad rash all over body  . Dizziness and weakness   Immunizations Administered    Name Date Dose VIS Date Route   Pfizer COVID-19 Vaccine 09/20/2019 11:39 AM 0.3 mL 05/19/2019 Intramuscular   Manufacturer: [place]   Lot: [id number]   [place]: [id number]

## 2019-11-06 DIAGNOSIS — R0981 Nasal congestion: Secondary | ICD-10-CM | POA: Diagnosis not present

## 2019-11-06 DIAGNOSIS — R05 Cough: Secondary | ICD-10-CM | POA: Diagnosis not present

## 2019-11-20 ENCOUNTER — Telehealth (INDEPENDENT_AMBULATORY_CARE_PROVIDER_SITE_OTHER): Payer: BC Managed Care – PPO | Admitting: Nurse Practitioner

## 2019-11-20 ENCOUNTER — Encounter: Payer: Self-pay | Admitting: Nurse Practitioner

## 2019-11-20 ENCOUNTER — Other Ambulatory Visit: Payer: Self-pay

## 2019-11-20 VITALS — BP 139/78 | Temp 98.4°F | Ht 67.0 in | Wt 172.0 lb

## 2019-11-20 DIAGNOSIS — N939 Abnormal uterine and vaginal bleeding, unspecified: Secondary | ICD-10-CM | POA: Insufficient documentation

## 2019-11-20 DIAGNOSIS — R21 Rash and other nonspecific skin eruption: Secondary | ICD-10-CM | POA: Diagnosis not present

## 2019-11-20 DIAGNOSIS — C569 Malignant neoplasm of unspecified ovary: Secondary | ICD-10-CM | POA: Insufficient documentation

## 2019-11-20 DIAGNOSIS — J4520 Mild intermittent asthma, uncomplicated: Secondary | ICD-10-CM

## 2019-11-20 DIAGNOSIS — N926 Irregular menstruation, unspecified: Secondary | ICD-10-CM | POA: Insufficient documentation

## 2019-11-20 DIAGNOSIS — N951 Menopausal and female climacteric states: Secondary | ICD-10-CM | POA: Insufficient documentation

## 2019-11-20 DIAGNOSIS — J069 Acute upper respiratory infection, unspecified: Secondary | ICD-10-CM | POA: Diagnosis not present

## 2019-11-20 DIAGNOSIS — R350 Frequency of micturition: Secondary | ICD-10-CM | POA: Insufficient documentation

## 2019-11-20 DIAGNOSIS — N390 Urinary tract infection, site not specified: Secondary | ICD-10-CM | POA: Insufficient documentation

## 2019-11-20 MED ORDER — DOXYCYCLINE HYCLATE 100 MG PO TABS: 100.0000 mg | ORAL_TABLET | Freq: Two times a day (BID) | ORAL | 0 refills | Status: AC

## 2019-11-20 MED ORDER — DEXTROMETHORPHAN-GUAIFENESIN 5-100 MG/5ML PO LIQD: 10.0000 mL | Freq: Every evening | ORAL | 0 refills | Status: AC | PRN

## 2019-11-20 MED ORDER — PREDNISONE 10 MG PO TABS: ORAL_TABLET | ORAL | 0 refills | Status: DC

## 2019-11-20 NOTE — Progress Notes (Signed)
Virtual Visit via Vidoe Note  This visit type was conducted due to national recommendations for restrictions regarding the COVID-19 pandemic (e.g. social distancing).  This format is felt to be most appropriate for this patient at this time.  All issues noted in this document were discussed and addressed.  No physical exam was performed (except for noted visual exam findings with Video Visits).   I connected with@ on 11/20/19 at  4:30 PM EDT by a video enabled telemedicine application or telephone and verified that I am speaking with the correct person using two identifiers. Location patient: home Location provider: work or home office Persons participating in the virtual visit: patient, provider  I discussed the limitations, risks, security and privacy concerns of performing an evaluation and management service by telephone and the availability of in person appointments. I also discussed with the patient that there may be a patient responsible charge related to this service. The patient expressed understanding and agreed to proceed.   Reason for visit: URI symptoms   HPI: 54 year old patient reports nasal congestion May 31, and was coughing up  green sputum and had nasal congestion.   Patient was seen on [place] Day at [place] and was given a steroid IM injection.  She was on her way to [place] for vacation.    Patient did feel better on Tuesday but by Wednesday and Thursday her symptoms were back again.  She has been taking Mucinex every 12 hours for cough, and she has been coughing like crazy.  She has been waking up, coughing up some green throughout the mucus, and blowing discharge as well.  She has hoarseness after coughing, and a little chest tightness.  She used her albuterol a couple times,  but it makes her nervous.  Her ears feel full, but no ear pain, or  sore throat, or  loss of taste or smell.  No fevers.  No chills.  She has no shortness of breath, but feels like the infection is  definitely in her head as well as in her chest and has not been resolving.  She has been using Simply Saline, sleeping with a humidifier, still wakes up every few hours with coughing and head congestion.She had COVID in December. She had the [place] vaccines.   She has a second problem which is a rash on her left forearm. She noticed before Memorial Day a black dot on her left forearm, and she rubbed it and the dark spot went away.  She is not sure if it was perhaps a tick or just a scab.  She is  left with a round raised very itchy rash.  It has been persistent despite using left over Clobetasol cream on it.    Her right forearm has 5 dots in a line which started after her trip to [facility]. She stayed in a hotel.  She also has some bites on her ankles wonder if she got bit by bedbugs.  ROS: See pertinent positives and negatives per HPI.  Past Medical History:  Diagnosis Date  . Abnormal Pap smear    ASCUS  . Asthma    exercise and cold(weather) induced  . Concussion 06/24/14   improved  . Dyspareunia   . Fibrocystic breast changes   . GERD (gastroesophageal reflux disease)    in past  . History of renal calculi July 2012   s/p lithotripsy Dahlstedt  . Hypertension       . Kidney stones   . Ovarian low malignant  potential tumor 2005   right salpingo-oophorectomy  . Pelvic pain in female   . Pelvic relaxation   . Vaginitis     Past Surgical History:  Procedure Laterality Date  . CESAREAN SECTION    . CHOLECYSTECTOMY    . COLONOSCOPY WITH PROPOFOL N/A 10/11/2015   Procedure: COLONOSCOPY WITH PROPOFOL;  Surgeon: Lucilla Lame, MD;  Location: [facility];  Service: Endoscopy;  Laterality: N/A;  latex sensitivity  . OOPHORECTOMY Right 2004   due to large benign cyst,  elevated CA125  . POLYPECTOMY  10/11/2015   Procedure: POLYPECTOMY;  Surgeon: Lucilla Lame, MD;  Location: [facility];  Service: Endoscopy;;    Family History  Problem Relation Age of Onset  . Hypertension  Father   . Stroke Father   . Hypertension Mother   . Hyperlipidemia Mother   . Cancer Sister        brain tumor    SOCIAL HX: non-smoker   Current Outpatient Medications:  .  albuterol (VENTOLIN HFA) 108 (90 Base) MCG/ACT inhaler, INHALE TWO PUFFS 4 TIMES DAILY AS NEEDED FOR  WHEEZING, Disp: 18 g, Rfl: 3 .  Cholecalciferol (VITAMIN D3) 1000 units CAPS, Take 1,000 Units by mouth., Disp: , Rfl:  .  indapamide (LOZOL) 1.25 MG tablet, 1.25 mg., Disp: , Rfl:  .  losartan (COZAAR) 50 MG tablet, TAKE 1 TABLET DAILY, Disp: 90 tablet, Rfl: 3 .  Probiotic Product (PROBIOTIC-10 PO), Take by mouth., Disp: , Rfl:  .  Dextromethorphan-guaiFENesin 5-100 MG/5ML LIQD, Take 10 mLs by mouth at bedtime as needed for up to 7 days (for cough)., Disp: 70 mL, Rfl: 0 .  doxycycline (VIBRA-TABS) 100 MG tablet, Take 1 tablet (100 mg total) by mouth 2 (two) times daily for 7 days., Disp: 14 tablet, Rfl: 0 .  predniSONE (DELTASONE) 10 MG tablet, Take 4 tablets ( total 40 mg) by mouth for 2 days; take 3 tablets ( total 30 mg) by mouth for 2 days; take 2 tablets ( total 20 mg) by mouth for 1 day; take 1 tablet ( total 10 mg) by mouth for 1 day., Disp: 17 tablet, Rfl: 0  EXAM:  VITALS per patient if applicable:  GENERAL: alert, oriented, appears well and in no acute distress  HEENT: atraumatic, conjunctiva clear, no obvious abnormalities on inspection of external nose and ears. Positive nasal congestion.   NECK: normal movements of the head and neck  LUNGS: on inspection no signs of respiratory distress, breathing rate appears normal, no obvious gross SOB, gasping or wheezing. Positive cough during the conversation not wet or congested- more dry and tight sounding.   CV: no obvious cyanosis  MS: moves all visible extremities without noticeable abnormality  PSYCH/NEURO: pleasant and cooperative, no obvious depression or anxiety, speech and thought processing grossly intact  ASSESSMENT AND PLAN:  Discussed  the following assessment and plan:  Upper respiratory tract infection, unspecified type  Mild intermittent asthma without complication  Rash  No problem-specific Assessment & Plan notes found for this encounter.  She was advised:  Obtain Covid test.   Begin doxycycline 100 mg twice daily for 1 week  prednisone taper Albuterol use 1 puff twice daily.  She feels nervous that she uses 2 puffs daily.   Mucinex DM at bedtime  Tessalon Perles as directed during the day next push fluids, rest  For your rashes:  If the rash on the arms worsen, you will need a dermatology referral. It is unclear whether these are insect  or other arthropod bites on the right arm and you mention ankles as well. If you were at the beach, sand fleas can bite. Bed bugs could be considered since you were at a hotel. Bed bugs have a darker center and paler red halo around the center . Please send photos.   The larger round red, itchy rash on the left arm- looked like some type of contact dermatitis but not poison ivy pattern.   Doxycycline and prednisone may help the skin rashes, as well.  Please send pictures of the rashes via My Chart for a clearer view.  Let us know if they do not resolve -or if getting worse.   Please give the office call for follow-up on symptoms in 2-3 days.  I discussed the assessment and treatment plan with the patient. The patient was provided an opportunity to ask questions and all were answered. The patient agreed with the plan and demonstrated an understanding of the instructions.   The patient was advised to call back or seek an in-person evaluation if the symptoms worsen or if the condition fails to improve as anticipated.    Denice Paradise, NP

## 2019-11-20 NOTE — Patient Instructions (Addendum)
For your nasal congestion and cough we discussed today:  Obtain Covid test.    Your cough and congestion may have started as an upper respiratory virus, but the length of time that you have had it,  persistent symptoms, and purulent sputum suggest likely bacterial infection.   Begin doxycycline 100 mg twice daily for 1 week Continue using your Simply Saline  Begin a prednisone taper Albuterol use 1 puff twice daily.   Tessalon Perles as directed during the day. Mucinex DM at bedtime  Push fluids, rest Please give the office a call for follow-up on URI  symptoms in 2-3 days. Call sooner or go to an Acute Care for in-person exam if symptoms worsen.   For your rashes:  If the rash on the arms worsen, you will need a dermatology referral. It is unclear whether these are insect or other arthropod bites on the right arm and you mention ankles as well. If you were at the beach, sand fleas can bite. Bed bugs could be considered since you were at a hotel. Bed bugs have a darker center and paler red halo around the center . Please send photos.   The larger round red, itchy rash on the left arm- looked like some type of contact dermatitis but not poison ivy pattern.   Doxycycline and prednisone may help the skin rashes, as well.  Please send pictures of the rashes via My Chart for a clearer view.  Let us know if they do not resolve -or if getting worse.    Upper Respiratory Infection, Adult An upper respiratory infection (URI) is a common viral infection of the nose, throat, and upper air passages that lead to the lungs. The most common type of URI is the common cold. URIs usually get better on their own, without medical treatment. What are the causes? A URI is caused by a virus. You may catch a virus by:  Breathing in droplets from an infected person's cough or sneeze.  Touching something that has been exposed to the virus (contaminated) and then touching your mouth, nose, or eyes. What increases  the risk? You are more likely to get a URI if:  You are very young or very old.  It is autumn or winter.  You have close contact with others, such as at a daycare, school, or health care facility.  You smoke.  You have long-term (chronic) heart or lung disease.  You have a weakened disease-fighting (immune) system.  You have nasal allergies or asthma.  You are experiencing a lot of stress.  You work in an area that has poor air circulation.  You have poor nutrition. What are the signs or symptoms? A URI usually involves some of the following symptoms:  Runny or stuffy (congested) nose.  Sneezing.  Cough.  Sore throat.  Headache.  Fatigue.  Fever.  Loss of appetite.  Pain in your forehead, behind your eyes, and over your cheekbones (sinus pain).  Muscle aches.  Redness or irritation of the eyes.  Pressure in the ears or face. How is this diagnosed? This condition may be diagnosed based on your medical history and symptoms, and a physical exam. Your health care provider may use a cotton swab to take a mucus sample from your nose (nasal swab). This sample can be tested to determine what virus is causing the illness. How is this treated? URIs usually get better on their own within 7-10 days. You can take steps at home to relieve your symptoms.  Medicines cannot cure URIs, but your health care provider may recommend certain medicines to help relieve symptoms, such as:  Over-the-counter cold medicines.  Cough suppressants. Coughing is a type of defense against infection that helps to clear the respiratory system, so take these medicines only as recommended by your health care provider.  Fever-reducing medicines. Follow these instructions at home: Activity  Rest as needed.  If you have a fever, stay home from work or school until your fever is gone or until your health care provider says you are no longer contagious. Your health care provider may have you wear a  face mask to prevent your infection from spreading. Relieving symptoms  Gargle with a salt-water mixture 3-4 times a day or as needed. To make a salt-water mixture, completely dissolve -1 tsp of salt in 1 cup of warm water.  Use a cool-mist humidifier to add moisture to the air. This can help you breathe more easily. Eating and drinking   Drink enough fluid to keep your urine pale yellow.  Eat soups and other clear broths. General instructions   Take over-the-counter and prescription medicines only as told by your health care provider. These include cold medicines, fever reducers, and cough suppressants.  Do not use any products that contain nicotine or tobacco, such as cigarettes and e-cigarettes. If you need help quitting, ask your health care provider.  Stay away from secondhand smoke.  Stay up to date on all immunizations, including the yearly (annual) flu vaccine.  Keep all follow-up visits as told by your health care provider. This is important. How to prevent the spread of infection to others   URIs can be passed from person to person (are contagious). To prevent the infection from spreading: ? Wash your hands often with soap and water. If soap and water are not available, use hand sanitizer. ? Avoid touching your mouth, face, eyes, or nose. ? Cough or sneeze into a tissue or your sleeve or elbow instead of into your hand or into the air. Contact a health care provider if:  You are getting worse instead of better.  You have a fever or chills.  Your mucus is brown or red.  You have yellow or brown discharge coming from your nose.  You have pain in your face, especially when you bend forward.  You have swollen neck glands.  You have pain while swallowing.  You have white areas in the back of your throat. Get help right away if:  You have shortness of breath that gets worse.  You have severe or persistent: ? Headache. ? Ear pain. ? Sinus pain. ? Chest  pain.  You have chronic lung disease along with any of the following: ? Wheezing. ? Prolonged cough. ? Coughing up blood. ? A change in your usual mucus.  You have a stiff neck.  You have changes in your: ? Vision. ? Hearing. ? Thinking. ? Mood. Summary  An upper respiratory infection (URI) is a common infection of the nose, throat, and upper air passages that lead to the lungs.  A URI is caused by a virus.  URIs usually get better on their own within 7-10 days.  Medicines cannot cure URIs, but your health care provider may recommend certain medicines to help relieve symptoms. This information is not intended to replace advice given to you by your health care provider. Make sure you discuss any questions you have with your health care provider. Document Revised: 06/02/2018 Document Reviewed: 01/08/2017 Elsevier Patient Education  [place] Dermatitis Dermatitis is redness, soreness, and swelling (inflammation) of the skin. Contact dermatitis is a reaction to certain substances that touch the skin. Many different substances can cause contact dermatitis. There are two types of contact dermatitis:  Irritant contact dermatitis. This type is caused by something that irritates your skin, such as having dry hands from washing them too often with soap. This type does not require previous exposure to the substance for a reaction to occur. This is the most common type.  Allergic contact dermatitis. This type is caused by a substance that you are allergic to, such as poison ivy. This type occurs when you have been exposed to the substance (allergen) and develop a sensitivity to it. Dermatitis may develop soon after your first exposure to the allergen, or it may not develop until the next time you are exposed and every time thereafter. What are the causes? Irritant contact dermatitis is most commonly caused by exposure  to:  Makeup.  Soaps.  Detergents.  Bleaches.  Acids.  Metal salts, such as nickel. Allergic contact dermatitis is most commonly caused by exposure to:  Poisonous plants.  Chemicals.  Jewelry.  Latex.  Medicines.  Preservatives in products, such as clothing. What increases the risk? You are more likely to develop this condition if you have:  A job that exposes you to irritants or allergens.  Certain medical conditions, such as asthma or eczema. What are the signs or symptoms? Symptoms of this condition may occur on your body anywhere the irritant has touched you or is touched by you.  Symptoms include: ? Dryness or flaking. ? Redness. ? Cracks. ? Itching. ? Pain or a burning feeling. ? Blisters. ? Drainage of small amounts of blood or clear fluid from skin cracks. With allergic contact dermatitis, there may also be swelling in areas such as the eyelids, mouth, or genitals. How is this diagnosed? This condition is diagnosed with a medical history and physical exam.  A patch skin test may be performed to help determine the cause.  If the condition is related to your job, you may need to see an occupational medicine specialist. How is this treated? This condition is treated by checking for the cause of the reaction and protecting your skin from further contact. Treatment may also include:  Steroid creams or ointments. Oral steroid medicines may be needed in more severe cases.  Antibiotic medicines or antibacterial ointments, if a skin infection is present.  Antihistamine lotion or an antihistamine taken by mouth to ease itching.  A bandage (dressing). Follow these instructions at home: Skin care  Moisturize your skin as needed.  Apply cool compresses to the affected areas.  Try applying baking soda paste to your skin. Stir water into baking soda until it reaches a paste-like consistency.  Do not scratch your skin, and avoid friction to the affected  area.  Avoid the use of soaps, perfumes, and dyes. Medicines  Take or apply over-the-counter and prescription medicines only as told by your health care provider.  If you were prescribed an antibiotic medicine, take or apply the antibiotic as told by your health care provider. Do not stop using the antibiotic even if your condition improves. Bathing  Try taking a bath with: ? Epsom salts. Follow the instructions on the packaging. You can get these at your local pharmacy or grocery store. ? Baking soda. Pour a small amount into the bath as directed by your health care provider. ? Colloidal oatmeal.  Follow the instructions on the packaging. You can get this at your local pharmacy or grocery store.  Bathe less frequently, such as every other day.  Bathe in lukewarm water. Avoid using hot water. Bandage care  If you were given a bandage (dressing), change it as told by your health care provider.  Wash your hands with soap and water before and after you change your dressing. If soap and water are not available, use hand sanitizer. General instructions  Avoid the substance that caused your reaction. If you do not know what caused it, keep a journal to try to track what caused it. Write down: ? What you eat. ? What cosmetic products you use. ? What you drink. ? What you wear in the affected area. This includes jewelry.  Check the affected areas every day for signs of infection. Check for: ? More redness, swelling, or pain. ? More fluid or blood. ? Warmth. ? Pus or a bad smell.  Keep all follow-up visits as told by your health care provider. This is important. Contact a health care provider if:  Your condition does not improve with treatment.  Your condition gets worse.  You have signs of infection such as swelling, tenderness, redness, soreness, or warmth in the affected area.  You have a fever.  You have new symptoms. Get help right away if:  You have a severe headache,  neck pain, or neck stiffness.  You vomit.  You feel very sleepy.  You notice red streaks coming from the affected area.  Your bone or joint underneath the affected area becomes painful after the skin has healed.  The affected area turns darker.  You have difficulty breathing. Summary  Dermatitis is redness, soreness, and swelling (inflammation) of the skin. Contact dermatitis is a reaction to certain substances that touch the skin.  Symptoms of this condition may occur on your body anywhere the irritant has touched you or is touched by you.  This condition is treated by figuring out what caused the reaction and protecting your skin from further contact. Treatment may also include medicines and skin care.  Avoid the substance that caused your reaction. If you do not know what caused it, keep a journal to try to track what caused it.  Contact a health care provider if your condition gets worse or you have signs of infection such as swelling, tenderness, redness, soreness, or warmth in the affected area. This information is not intended to replace advice given to you by your health care provider. Make sure you discuss any questions you have with your health care provider. Document Revised: 09/14/2018 Document Reviewed: 12/08/2017 Elsevier Patient Education  2020 Elsevier

## 2019-11-21 ENCOUNTER — Other Ambulatory Visit: Payer: Self-pay | Admitting: Nurse Practitioner

## 2019-11-21 ENCOUNTER — Telehealth: Payer: Self-pay

## 2019-11-21 MED ORDER — BENZONATATE 100 MG PO CAPS: 100.0000 mg | ORAL_CAPSULE | Freq: Three times a day (TID) | ORAL | 0 refills | Status: DC | PRN

## 2019-11-21 NOTE — Telephone Encounter (Signed)
Patient called and said she was confused on what she is suppose to take for her cough. Her pharmacy said that [facility] sent in a prescription that is a over the counter cough medication. She wants to make sure she is taking the correct medication.

## 2019-11-21 NOTE — Telephone Encounter (Signed)
Caller states that she had a virtual visit today. She was to have a steroid and antibiotic, and a couple other medications. Denies any new or worsening sx for triage. Caller understands that they were sent in to [place]. She just didn't make it there in time before they closed. Denies any new or worsening sx for Triage.   [place] RECORD [facility]urse Patient Name: Dawn Russell Gender: Female DOB: [birth year] Age: 54 Y 10 M 10 D Return Phone Number: [phone] (Primary) Address: City/State/Zip: [place] [address] Client [facility] [place] Night - Cl Client Site [place] - Night Contact Type Call Who Is Calling Patient / Member / Family / Caregiver Call Type Triage / Clinical Relationship To Patient Self Return Phone Number [phone] (Primary) Chief Complaint Prescription Refill or Medication Request (non symptomatic) Reason for Call Symptomatic / Request for Health Information Initial Comment Caller states Dr. Derrel Nip is her normal provider but, she saw Denice Paradise via virtual visit today. Caller would like to have her prescriptions sent to [facility] [phone]. Didn't give any symptoms. Translation No Nurse Assessment Nurse: Carlis Abbott, RN, Estill Bamberg Date/Time (Eastern Time): 11/20/2019 9:29:50 PM Confirm and document reason for call. If symptomatic, describe symptoms. ---Caller states that she had a virtual visit today. She was to have a steroid and antibiotic, and a couple other medications. Denies any new or worsening sx for triage. Caller understands that they were sent in to [place]. She just didn't make it there in time before they closed. Denies any new or worsening sx for triage. Has the patient had close contact with a person known or suspected to have the novel coronavirus illness OR traveled / lives in area with major community  spread (including international travel) in the last 14 days from the onset of symptoms? * If Asymptomatic, screen for exposure and travel within the last 14 days. ---No Does the patient have any new or worsening symptoms? ---No Please document clinical information provided and list any resource used. ---Caller had originally called hoping to try and get the Rxs called into a pharmacy that was still opened. Nurse: Carlis Abbott, RN, Estill Bamberg Date/Time (Eastern Time): 11/20/2019 9:34:37 PM Please select the assessment type ---Verbal order / New medication order Does the client directives allow for assistance with medications after hours? ---No Additional Documentation ---Caller advised that medication orders have to be called in during office hours. She said that everything is okay and that she will be able to pick up the Rx in the morning when [facility] opens. Denies further questions or concerns.PLEASE NOTE: All timestamps contained within this report are represented as [place] Standard Time. CONFIDENTIALTY NOTICE: This fax transmission is intended only for the addressee. It contains information that is legally privileged, confidential or otherwise protected from use or disclosure. If you are not the intended recipient, you are strictly prohibited from reviewing, disclosing, copying using or disseminating any of this information or taking any action in reliance on or regarding this information. If you have received this fax in error, please notify us immediately by telephone so that we can arrange for its return to [place]. Phone: [phone], Toll-Free: [phone], Fax: [phone] Page: 2 of 2 Call Id: [id number] Guidelines Guideline Title Affirmed Question Affirmed Notes Nurse Date/Time Eilene Ghazi Time) Disp. Time Eilene Ghazi Time) Disposition Final User 11/20/2019 9:24:25 PM Send To RN [employer], Zach 11/20/2019 9:36:32 PM Clinical Call Yes Carlis Abbott, RN, Estill Bamberg

## 2019-11-21 NOTE — Progress Notes (Signed)
I ordered Tessalon Perles to take during the daytime for cough.   The over the counter Mucinex DM - green label is for bedtime.

## 2019-11-22 ENCOUNTER — Other Ambulatory Visit: Payer: Self-pay

## 2019-11-22 MED ORDER — BENZONATATE 100 MG PO CAPS: 100.0000 mg | ORAL_CAPSULE | Freq: Three times a day (TID) | ORAL | 0 refills | Status: AC | PRN

## 2019-11-23 ENCOUNTER — Telehealth: Payer: Self-pay | Admitting: Internal Medicine

## 2019-11-23 ENCOUNTER — Ambulatory Visit: Payer: BC Managed Care – PPO | Attending: Internal Medicine

## 2019-11-23 ENCOUNTER — Other Ambulatory Visit: Payer: Self-pay | Admitting: Internal Medicine

## 2019-11-23 DIAGNOSIS — Z20822 Contact with and (suspected) exposure to covid-19: Secondary | ICD-10-CM

## 2019-11-23 NOTE — Telephone Encounter (Signed)
FYI

## 2019-11-23 NOTE — Telephone Encounter (Signed)
Pt called in stated that she is

## 2019-11-23 NOTE — Telephone Encounter (Signed)
Pt called to give update to let provider know she is feeling better,morning congestion mucus is yellow now, and the rash is drying up.

## 2019-11-24 LAB — SARS-COV-2, NAA 2 DAY TAT

## 2019-11-24 LAB — NOVEL CORONAVIRUS, NAA: SARS-CoV-2, NAA: NOT DETECTED

## 2019-11-29 DIAGNOSIS — N200 Calculus of kidney: Secondary | ICD-10-CM | POA: Diagnosis not present

## 2019-11-30 DIAGNOSIS — S39012A Strain of muscle, fascia and tendon of lower back, initial encounter: Secondary | ICD-10-CM | POA: Diagnosis not present

## 2019-11-30 DIAGNOSIS — F4320 Adjustment disorder, unspecified: Secondary | ICD-10-CM | POA: Diagnosis not present

## 2019-12-19 DIAGNOSIS — F4320 Adjustment disorder, unspecified: Secondary | ICD-10-CM | POA: Diagnosis not present

## 2020-01-31 DIAGNOSIS — Z01419 Encounter for gynecological examination (general) (routine) without abnormal findings: Secondary | ICD-10-CM | POA: Diagnosis not present

## 2020-01-31 DIAGNOSIS — Z124 Encounter for screening for malignant neoplasm of cervix: Secondary | ICD-10-CM | POA: Diagnosis not present

## 2020-01-31 DIAGNOSIS — Z1231 Encounter for screening mammogram for malignant neoplasm of breast: Secondary | ICD-10-CM | POA: Diagnosis not present

## 2020-01-31 DIAGNOSIS — R87610 Atypical squamous cells of undetermined significance on cytologic smear of cervix (ASC-US): Secondary | ICD-10-CM | POA: Diagnosis not present

## 2020-01-31 DIAGNOSIS — K635 Polyp of colon: Secondary | ICD-10-CM | POA: Insufficient documentation

## 2020-01-31 DIAGNOSIS — Z8543 Personal history of malignant neoplasm of ovary: Secondary | ICD-10-CM | POA: Diagnosis not present

## 2020-01-31 DIAGNOSIS — Z6827 Body mass index (BMI) 27.0-27.9, adult: Secondary | ICD-10-CM | POA: Diagnosis not present

## 2020-03-21 ENCOUNTER — Ambulatory Visit: Payer: BC Managed Care – PPO | Admitting: Internal Medicine

## 2020-03-29 ENCOUNTER — Ambulatory Visit (INDEPENDENT_AMBULATORY_CARE_PROVIDER_SITE_OTHER): Payer: BC Managed Care – PPO

## 2020-03-29 ENCOUNTER — Other Ambulatory Visit (INDEPENDENT_AMBULATORY_CARE_PROVIDER_SITE_OTHER): Payer: BC Managed Care – PPO

## 2020-03-29 ENCOUNTER — Other Ambulatory Visit: Payer: Self-pay

## 2020-03-29 DIAGNOSIS — E559 Vitamin D deficiency, unspecified: Secondary | ICD-10-CM | POA: Diagnosis not present

## 2020-03-29 DIAGNOSIS — Z23 Encounter for immunization: Secondary | ICD-10-CM

## 2020-03-29 DIAGNOSIS — E210 Primary hyperparathyroidism: Secondary | ICD-10-CM | POA: Diagnosis not present

## 2020-03-29 LAB — VITAMIN D 25 HYDROXY (VIT D DEFICIENCY, FRACTURES): VITD: 25.88 ng/mL — ABNORMAL LOW (ref 30.00–100.00)

## 2020-03-30 LAB — PTH, INTACT AND CALCIUM
Calcium: 10.1 mg/dL (ref 8.7–10.2)
PTH: 19 pg/mL (ref 15–65)

## 2020-04-04 ENCOUNTER — Other Ambulatory Visit: Payer: Self-pay

## 2020-04-04 ENCOUNTER — Ambulatory Visit: Payer: BC Managed Care – PPO | Admitting: Internal Medicine

## 2020-04-04 ENCOUNTER — Encounter: Payer: Self-pay | Admitting: Internal Medicine

## 2020-04-04 VITALS — BP 128/80 | HR 86 | Ht 67.0 in | Wt 169.8 lb

## 2020-04-04 DIAGNOSIS — E210 Primary hyperparathyroidism: Secondary | ICD-10-CM

## 2020-04-04 DIAGNOSIS — E559 Vitamin D deficiency, unspecified: Secondary | ICD-10-CM

## 2020-04-04 DIAGNOSIS — E213 Hyperparathyroidism, unspecified: Secondary | ICD-10-CM

## 2020-04-04 NOTE — Progress Notes (Signed)
Patient ID: Dawn Russell, female   DOB: Jul 14, 1965, 54 y.o.   MRN: [id number]   This visit occurred during the SARS-CoV-2 public health emergency.  Safety protocols were in place, including screening questions prior to the visit, additional usage of staff PPE, and extensive cleaning of exam room while observing appropriate contact time as indicated for disinfecting solutions.   HPI  Dawn Russell is a 54 y.o.-year-old female, initially referred by her PCP, Dr. Derrel Nip, returning for follow-up for hypercalcemia/hyperparathyroidism.  Last visit 6 months ago.  Her sister (had lung cancer) died since last OV.   Reviewed and addended history: She had her first kidney stone in 2013 (had to have lithotripsy) and had another kidney stone recently (passed it and was analyzed by urology).  At that time, she was told that the stone was made of calcium and she also had a 24-hour urine collection showing a high urinary calcium. She is seeing Dr. Diona Fanti.  She was started on indapamide before our last visit  - to reduce kidney stone incidence.  A 24-hour urine collection checked after she started on indapamide showed an improved calcium level.   However, at last visit, to be able to further investigated her parathyroid status, we stopped indapamide.  Repeat calcium and PTH were normal.  No new kidney stones since last visit, however, she tells me that she does have 1 in the left kidney, unchanged.  She had an elevated calcium in 06/2018 and then again in 04/21.  PTH level was not suppressed initially, but at last check, it was appropriate for calcium in the upper limit of normal.  Reviewed pertinent labs: Lab Results  Component Value Date   PTH 19 03/29/2020   PTH Comment 03/29/2020   PTH 36 09/15/2019   PTH Comment 09/15/2019   PTH 64 03/22/2019   PTH 29 01/13/2019   PTH 71 (H) 09/13/2018   CALCIUM 10.1 03/29/2020   CALCIUM 10.5 (H) 09/15/2019   CALCIUM 9.7 03/22/2019   CALCIUM 10.0 01/13/2019    CALCIUM 9.8 09/13/2018   CALCIUM 9.9 09/13/2018   CALCIUM 10.8 (H) 07/01/2018   CALCIUM 9.8 11/26/2017   CALCIUM 9.6 06/29/2017   CALCIUM 9.4 05/28/2017   Component     Latest Ref Rng & Units 09/13/2018  Calcium Ionized     4.8 - 5.6 mg/dL 5.33   11/30/2018: Reviewed records from Dr. Diona Fanti: Patient had elevated urine calcium in 24-hour urine collection (08/04/2018) at 452.  After starting HCTZ, it  decreased to 234 (11/02/2018)  A year ago, her calcium, phosphorus, magnesium, were normal, while her calcitriol level was slightly elevated. Component     Latest Ref Rng & Units 03/22/2019  Calcium     8.6 - 10.4 mg/dL 9.7  Phosphorus     2.5 - 4.5 mg/dL 3.0  Magnesium     1.5 - 2.5 mg/dL 2.0  PTH, Intact     14 - 64 pg/mL 64   Vitamin D 1, 25 (OH) Total     18 - 72 pg/mL 83 (H)  Vitamin D3 1, 25 (OH)     pg/mL 83  Vitamin D2 1, 25 (OH)     pg/mL <8   No history of osteoporosis, fractures, falls.  No previous DEXA scans.  She has a history of kidney stones x2: 05/2018: [facility] analysis: Carbonate apatite (calcium phosphate) (90%) calcium oxalate (10%) 09/08/2017: CT abdomen showing nonobstructive left nephrolithiasis.  No CKD. Last BUN/Cr: Lab Results  Component Value Date  BUN 14 03/22/2019   BUN 11 01/13/2019   CREATININE 0.55 03/22/2019   CREATININE 0.61 01/13/2019   She is not on HCTZ or indapamide now.  She is trying to follow a low-sodium diet.  She relaxed her diet in the last few months due to being very busy.  She has a history of vitamin D deficiency. Previously on ergocalciferol, then, at last visit she was on 1000 units daily.  I advised her to increase the dose to 2000 units daily.  She is on this dose now.  However, latest vitamin D level was not much changed from before: Lab Results  Component Value Date   VD25OH 25.88 (L) 03/29/2020   VD25OH 25.74 (L) 09/15/2019   VD25OH 32.90 11/15/2018   VD25OH 21.06 (L) 11/26/2016   VD25OH 12.72 (L) 06/15/2016    Pt does not have a FH of hypercalcemia, pituitary tumors, thyroid cancer, or osteoporosis.  Mother had partial thyroidectomy (? Dx).  She lost 30 lbs in 2018 - [employer].  At last visit we discussed about a plant-based diet and I gave her references.  She did not try these >> would like me to give these to her again.   1 of her sisters developed a brain tumor at 4 years old and died from it at 55 years old.  ROS: Constitutional: no weight gain/no weight loss, no fatigue, no subjective hyperthermia, no subjective hypothermia Eyes: no blurry vision, no xerophthalmia ENT: no sore throat, no nodules palpated in neck, no dysphagia, no odynophagia, no hoarseness Cardiovascular: no CP/no SOB/no palpitations/no leg swelling Respiratory: no cough/no SOB/no wheezing Gastrointestinal: no N/no V/no D/no C/no acid reflux Musculoskeletal: no muscle aches/no joint aches Skin: no rashes, no hair loss Neurological: no tremors/no numbness/no tingling/no dizziness  I reviewed pt's medications, allergies, PMH, social hx, family hx, and changes were documented in the history of present illness. Otherwise, unchanged from my initial visit note.  Past Medical History:  Diagnosis Date  . Abnormal Pap smear    ASCUS  . Asthma    exercise and cold(weather) induced  . Concussion 06/24/14   improved  . Dyspareunia   . Fibrocystic breast changes   . GERD (gastroesophageal reflux disease)    in past  . History of renal calculi July 2012   s/p lithotripsy [place]  . Hypertension       . Kidney stones   . 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 Pelvic pain in female   . Pelvic relaxation   . Vaginitis    Past Surgical History:  Procedure Laterality Date  . CESAREAN SECTION    . CHOLECYSTECTOMY    . COLONOSCOPY WITH PROPOFOL N/A 10/11/2015   Procedure: COLONOSCOPY WITH PROPOFOL;  Surgeon: Lucilla Lame, MD;  Location: [place];  Service: Endoscopy;   Laterality: N/A;  latex sensitivity  . OOPHORECTOMY Right 2004   due to large benign cyst,  elevated CA125  . POLYPECTOMY  10/11/2015   Procedure: POLYPECTOMY;  Surgeon: Lucilla Lame, MD;  Location: [place];  Service: Endoscopy;;   Social History   Socioeconomic History  . Marital status: Married    Spouse name: Not on file  . Number of children: 2  . Years of education: Not on file  . Highest education level: Not on file  Occupational History  .  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 Financial resource strain: Not on file  . Food insecurity:    Worry: Not  on file    Inability: Not on file  . Transportation needs:    Medical: Not on file    Non-medical: Not on file  Tobacco Use  . Smoking status: Never Smoker  . Smokeless tobacco: Never Used  Substance and Sexual Activity  . Alcohol use: No  . Drug use: No  . Sexual activity: Yes    Birth control/protection: Surgical    Comment: vas.  Lifestyle  . Physical activity:    Days per week: Not on file    Minutes per session: Not on file  . Stress: Not on file  Relationships  . Social connections:    Talks on phone: Not on file    Gets together: Not on file    Attends religious service: Not on file    Active member of club or organization: Not on file    Attends meetings of clubs or organizations: Not on file    Relationship status: Not on file  . Intimate partner violence:    Fear of current or ex partner: Not on file    Emotionally abused: Not on file    Physically abused: Not on file    Forced sexual activity: Not on file  Other Topics Concern  . Not on file  Social History Narrative   Married    Current Outpatient Medications on File Prior to Visit  Medication Sig Dispense Refill  . albuterol (VENTOLIN HFA) 108 (90 Base) MCG/ACT inhaler INHALE TWO PUFFS 4 TIMES DAILY AS NEEDED FOR  WHEEZING 18 g 3  . Cholecalciferol (VITAMIN D3) 1000 units CAPS Take 1,000 Units by mouth.    . indapamide (LOZOL)  1.25 MG tablet 1.25 mg.    . losartan (COZAAR) 50 MG tablet TAKE 1 TABLET DAILY 90 tablet 3  . predniSONE (DELTASONE) 10 MG tablet Take 4 tablets ( total 40 mg) by mouth for 2 days; take 3 tablets ( total 30 mg) by mouth for 2 days; take 2 tablets ( total 20 mg) by mouth for 1 day; take 1 tablet ( total 10 mg) by mouth for 1 day. 17 tablet 0  . Probiotic Product (PROBIOTIC-10 PO) Take by mouth.     No current facility-administered medications on file prior to visit.   Allergies  Allergen Reactions  . Amoxicillin Rash  . Latex Rash    Burning after internal ultrasound  . Monistat [Miconazole] Rash    Itching and burning  . Septra [Bactrim] Rash  . Sulfa Antibiotics Rash  . Sulfamethoxazole-Trimethoprim Rash   Family History  Problem Relation Age of Onset  . Hypertension Father   . Stroke Father   . Hypertension Mother   . Hyperlipidemia Mother   . Cancer Sister        brain tumor    PE: There were no vitals taken for this visit. Wt Readings from Last 3 Encounters:  11/20/19 172 lb (78 kg)  09/15/19 171 lb (77.6 kg)  03/16/19 169 lb (76.7 kg)   Constitutional: + slightly overweight, in NAD Eyes: PERRLA, EOMI, no exophthalmos ENT: moist mucous membranes, no thyromegaly, no cervical lymphadenopathy Cardiovascular: RRR, No MRG Respiratory: CTA B Gastrointestinal: abdomen soft, NT, ND, BS+ Musculoskeletal: no deformities, strength intact in all 4 Skin: moist, warm, no rashes Neurological: no tremor with outstretched hands, DTR normal in all 4  Assessment: 1. Hypercalcemia/hyperparathyroidism  2.  Vitamin D deficiency  Plan: Patient with history of elevated calcium x2, with the highest level being 10.8.  Subsequently intact PTH was  slightly high, at 71, but with a corresponding calcium at that time in the normal range, at 9.8.  She was also found to have an elevated 24-hour urine calcium in the urologist office and she was started on indapamide.  To investigate her  hyperparathyroidism, we took her off indapamide and 11/2018.  She was telling me at previous visits that she would prefer to stay off this medication.  We repeated her calcium and parathyroid hormone at last visit, 6 months ago and the calcium was slightly high, while PTH was in the normal range, but not suppressed.  Vitamin D level was still slightly low and we increased her vitamin D supplement at that time. -we reviewed her most recent calcium, PTH, and vitamin D obtained earlier this month.  Vitamin D level was still slightly low, however calcium was normal and intact PTH was actually excellent, lower than 20 -I do believe that she may have primary hyperparathyroidism, especially since her calcitriol level was also slightly high, magnesium was normal, and she has a history of nephrolithiasis x2.  No evidence of osteoporosis or fractures.  Also, no abdominal pain, depression, bone pain.. -We did discuss about possible consequences of hyperparathyroidism (osteoporosis, kidney stones), but the incidence varies with the degree of hyperparathyroidism -She meets criteria for surgery (in bold below) . Increased calcium by more than 1 mg/dL above the upper limit of normal  . Kidney ds.  . Osteoporosis (or vertebral fracture) . Age <73 years old 2013 criteria: . High UCa >400 mg/d and increased stone risk by biochemical stone risk analysis . Presence of nephrolithiasis or nephrocalcinosis . Pt's preference! -We will go ahead and check another DXA scan in the future including a 33% distal radius for evaluation of cortical bone, which is predominantly affected by hyperparathyroidism.  -I will also go ahead and order a Tc sestamibi parathyroid scan.  She would like to check with her insurance first to see if she met her deductible. - I will see the patient back in 6 months  2.  Vitamin D deficiency -Patient with a history of a very low vitamin D level in 2012, then improved, but still suboptimal despite  increase in her vitamin D supplement from 1000 to 2000 units daily at last visit.  She is taking this dose now. -Latest vitamin D level checked earlier this month was only 25.88 -At this visit, we will the dose of vitamin D to 4000 units daily  Patient Instructions  Please increase vitamin D to 4000 units daily.  We need to schedule a parathyroid scan: [phone]  We need to schedule a bone density scan: [phone]  Please come back for a follow-up appointment in 6 months.  Orders Placed This Encounter  Procedures  . DG Bone Density  . NM Parathyroid W/Spect/CT   Philemon Kingdom, MD PhD [facility]

## 2020-04-04 NOTE — Patient Instructions (Addendum)
Please increase vitamin D to 4000 units daily.  We need to schedule a parathyroid scan: [phone]  We need to schedule a bone density scan: [phone]  Please come back for a follow-up appointment in 6 months.

## 2020-07-05 ENCOUNTER — Telehealth (INDEPENDENT_AMBULATORY_CARE_PROVIDER_SITE_OTHER): Payer: Self-pay | Admitting: Gastroenterology

## 2020-07-05 DIAGNOSIS — Z86010 Personal history of colonic polyps: Secondary | ICD-10-CM

## 2020-07-05 NOTE — Progress Notes (Signed)
Gastroenterology Pre-Procedure Review  Request Date: Friday 10/11/20 Requesting Physician: Dr. Allen Norris  PATIENT REVIEW QUESTIONS: The patient responded to the following health history questions as indicated:    1. Are you having any GI issues? no 2. Do you have a personal history of Polyps? yes (10/11/15 performed by dr. Allen Norris) 3. Do you have a family history of Colon Cancer or Polyps? no 4. Diabetes Mellitus? no 5. Joint replacements in the past 12 months?no 6. Major health problems in the past 3 months?no 7. Any artificial heart valves, MVP, or defibrillator?no    MEDICATIONS & ALLERGIES:    Patient reports the following regarding taking any anticoagulation/antiplatelet therapy:   Plavix, Coumadin, Eliquis, Xarelto, Lovenox, Pradaxa, Brilinta, or Effient? no Aspirin? no  Patient confirms/reports the following medications:  Current Outpatient Medications  Medication Sig Dispense Refill  . albuterol (VENTOLIN HFA) 108 (90 Base) MCG/ACT inhaler INHALE TWO PUFFS 4 TIMES DAILY AS NEEDED FOR  WHEEZING 18 g 3  . Cholecalciferol (VITAMIN D3) 1000 units CAPS Take 4,000 Int'l Units by mouth.    . Loratadine 10 MG CAPS Take by mouth.    . losartan (COZAAR) 50 MG tablet TAKE 1 TABLET DAILY 90 tablet 3  . indapamide (LOZOL) 1.25 MG tablet 1.25 mg. (Patient not taking: Reported on 07/05/2020)    . predniSONE (DELTASONE) 10 MG tablet Take 4 tablets ( total 40 mg) by mouth for 2 days; take 3 tablets ( total 30 mg) by mouth for 2 days; take 2 tablets ( total 20 mg) by mouth for 1 day; take 1 tablet ( total 10 mg) by mouth for 1 day. (Patient not taking: Reported on 07/05/2020) 17 tablet 0  . Probiotic Product (PROBIOTIC-10 PO) Take by mouth. (Patient not taking: No sig reported)     No current facility-administered medications for this visit.    Patient confirms/reports the following allergies:  Allergies  Allergen Reactions  . Amoxicillin Rash  . Latex Rash    Burning after internal ultrasound  .  Monistat [Miconazole] Rash    Itching and burning  . Septra [Bactrim] Rash  . Sulfa Antibiotics Rash  . Sulfamethoxazole-Trimethoprim Rash    No orders of the defined types were placed in this encounter.   AUTHORIZATION INFORMATION Primary Insurance: 1D#: Group #:  Secondary Insurance: 1D#: Group #:  SCHEDULE INFORMATION: Date: 10/11/20 Time: Location:[facility]

## 2020-07-25 ENCOUNTER — Other Ambulatory Visit: Payer: Self-pay | Admitting: Internal Medicine

## 2020-09-03 ENCOUNTER — Other Ambulatory Visit: Payer: Self-pay

## 2020-09-03 ENCOUNTER — Ambulatory Visit (INDEPENDENT_AMBULATORY_CARE_PROVIDER_SITE_OTHER)
Admission: RE | Admit: 2020-09-03 | Discharge: 2020-09-03 | Disposition: A | Discharge status: Home / Self Care | Payer: BC Managed Care – PPO | Source: Ambulatory Visit | Attending: Internal Medicine | Admitting: Internal Medicine

## 2020-09-03 DIAGNOSIS — E210 Primary hyperparathyroidism: Secondary | ICD-10-CM

## 2020-09-08 DIAGNOSIS — E210 Primary hyperparathyroidism: Secondary | ICD-10-CM | POA: Diagnosis not present

## 2020-09-16 ENCOUNTER — Ambulatory Visit (HOSPITAL_COMMUNITY): Payer: BC Managed Care – PPO

## 2020-09-16 ENCOUNTER — Other Ambulatory Visit (HOSPITAL_COMMUNITY): Payer: BC Managed Care – PPO

## 2020-09-17 ENCOUNTER — Ambulatory Visit (HOSPITAL_COMMUNITY)
Admission: RE | Admit: 2020-09-17 | Discharge: 2020-09-17 | Disposition: A | Discharge status: Home / Self Care | Payer: BC Managed Care – PPO | Source: Ambulatory Visit | Attending: Internal Medicine | Admitting: Internal Medicine

## 2020-09-17 ENCOUNTER — Other Ambulatory Visit: Payer: Self-pay

## 2020-09-17 ENCOUNTER — Encounter (HOSPITAL_COMMUNITY)
Admission: RE | Admit: 2020-09-17 | Discharge: 2020-09-17 | Disposition: A | Discharge status: Home / Self Care | Payer: BC Managed Care – PPO | Source: Ambulatory Visit | Attending: Internal Medicine | Admitting: Internal Medicine

## 2020-09-17 DIAGNOSIS — E213 Hyperparathyroidism, unspecified: Secondary | ICD-10-CM | POA: Diagnosis not present

## 2020-09-17 MED ORDER — TECHNETIUM TC 99M SESTAMIBI GENERIC - CARDIOLITE
26.3000 | Freq: Once | INTRAVENOUS | Status: AC | PRN
  Administered 2020-09-17: 26.3 via INTRAVENOUS

## 2020-10-03 ENCOUNTER — Ambulatory Visit: Payer: BC Managed Care – PPO | Admitting: Internal Medicine

## 2020-10-03 ENCOUNTER — Other Ambulatory Visit: Payer: Self-pay

## 2020-10-03 ENCOUNTER — Encounter: Payer: Self-pay | Admitting: Internal Medicine

## 2020-10-03 ENCOUNTER — Other Ambulatory Visit: Payer: BC Managed Care – PPO

## 2020-10-03 VITALS — BP 128/90 | HR 91 | Ht 67.0 in | Wt 180.6 lb

## 2020-10-03 DIAGNOSIS — E559 Vitamin D deficiency, unspecified: Secondary | ICD-10-CM

## 2020-10-03 DIAGNOSIS — E210 Primary hyperparathyroidism: Secondary | ICD-10-CM | POA: Diagnosis not present

## 2020-10-03 LAB — BASIC METABOLIC PANEL
BUN: 13 mg/dL (ref 6–23)
CO2: 29 mEq/L (ref 19–32)
Calcium: 10.3 mg/dL (ref 8.4–10.5)
Chloride: 105 mEq/L (ref 96–112)
Creatinine, Ser: 0.76 mg/dL (ref 0.40–1.20)
GFR: 88.02 mL/min (ref >60.00)
Glucose, Bld: 123 mg/dL — ABNORMAL HIGH (ref 70–99)
Potassium: 3.8 mEq/L (ref 3.5–5.1)
Sodium: 141 mEq/L (ref 135–145)

## 2020-10-03 NOTE — Patient Instructions (Addendum)
Please continue vitamin D 4000 units daily.  I will refer you to see Dr. Harlow Asa.  Please stop at the lab.  Please come back for a follow-up appointment in 6 months.

## 2020-10-03 NOTE — Progress Notes (Signed)
Patient ID: Dawn Russell, female   DOB: Nov 01, 1965, 55 y.o.   MRN: [id number]   This visit occurred during the SARS-CoV-2 public health emergency.  Safety protocols were in place, including screening questions prior to the visit, additional usage of staff PPE, and extensive cleaning of exam room while observing appropriate contact time as indicated for disinfecting solutions.   HPI  Dawn Russell is a 55 y.o.-year-old female, initially referred by her PCP, Dr. Derrel Nip, returning for follow-up for hypercalcemia/hyperparathyroidism.  Last visit 6 months ago.   Interim history: Since last visit, he got a promotion at work and is now busier. Otherwise, she has been feeling well, without complaints.  Reviewed and addended history: She had her first kidney stone in 2013 (had to have lithotripsy) and had another kidney stone recently (passed it and was analyzed by urology).  At that time, she was told that the stone was made of calcium and she also had a 24-hour urine collection showing a high urinary calcium. She is seeing Dr. Diona Fanti.  She was started on indapamide before our last visit  - to reduce kidney stone incidence.  A 24-hour urine collection checked after she started on indapamide showed an improved calcium level.   To be able to further investigated her parathyroid status, we stopped indapamide.  Repeat calcium and PTH were normal.  No new kidney stones, however, she tells me that she does have 1 in the left kidney, unchanged. She changed her diet: No red meat, decreased salt.  She had an elevated calcium in 06/2018 and then again in 04/21.  PTH level was not suppressed initially, but at last check, it was appropriate for calcium in the upper limit of normal.  We checked a Tc sestamibi parathyroid scan (09/17/2020) and this showed a left inferior parathyroid adenoma.   Reviewed pertinent labs: Lab Results  Component Value Date   PTH 19 03/29/2020   PTH Comment 03/29/2020   PTH 36  09/15/2019   PTH Comment 09/15/2019   PTH 64 03/22/2019   PTH 29 01/13/2019   PTH 71 (H) 09/13/2018   CALCIUM 10.1 03/29/2020   CALCIUM 10.5 (H) 09/15/2019   CALCIUM 9.7 03/22/2019   CALCIUM 10.0 01/13/2019   CALCIUM 9.8 09/13/2018   CALCIUM 9.9 09/13/2018   CALCIUM 10.8 (H) 07/01/2018   CALCIUM 9.8 11/26/2017   CALCIUM 9.6 06/29/2017   CALCIUM 9.4 05/28/2017   Component     Latest Ref Rng & Units 09/13/2018  Calcium Ionized     4.8 - 5.6 mg/dL 5.33   11/30/2018: Reviewed records from Dr. Diona Fanti: Patient had elevated urine calcium in 24-hour urine collection (08/04/2018) at 452.  After starting HCTZ, it  decreased to 234 (11/02/2018)  A year ago, her calcium, phosphorus, magnesium, were normal, while her calcitriol level was slightly elevated: Component     Latest Ref Rng & Units 03/22/2019  Calcium     8.6 - 10.4 mg/dL 9.7  Phosphorus     2.5 - 4.5 mg/dL 3.0  Magnesium     1.5 - 2.5 mg/dL 2.0  PTH, Intact     14 - 64 pg/mL 64   Vitamin D 1, 25 (OH) Total     18 - 72 pg/mL 83 (H)  Vitamin D3 1, 25 (OH)     pg/mL 83  Vitamin D2 1, 25 (OH)     pg/mL <8   No history of osteoporosis, fractures, falls.   We obtained a DXA scan: This showed mild  osteopenia: 09/03/2020 - [place] Lumbar spine L1-L4 Femoral neck (FN) 33% distal radius  T-score -0.4 RFN: -1.0 LFN: -1.1 -0.8   She has a history of kidney stones x2: 05/2018: [facility] analysis: Carbonate apatite (calcium phosphate) (90%) calcium oxalate (10%) 09/08/2017: CT abdomen showing nonobstructive left nephrolithiasis.  No CKD. Last BUN/Cr: Lab Results  Component Value Date   BUN 14 03/22/2019   BUN 11 01/13/2019   CREATININE 0.55 03/22/2019   CREATININE 0.61 01/13/2019   She is not on HCTZ or indapamide now.  She is trying to follow a low-sodium diet.  She relaxed her diet in the last few months due to being very busy.  She has a history of vitamin D deficiency. Previously on ergocalciferol, then on oral vitamin  D supplement, increased from 2000 to 4000 units daily at last visit.  Reviewed previous vitamin D levels: Lab Results  Component Value Date   VD25OH 25.88 (L) 03/29/2020   VD25OH 25.74 (L) 09/15/2019   VD25OH 32.90 11/15/2018   VD25OH 21.06 (L) 11/26/2016   VD25OH 12.72 (L) 06/15/2016   Pt does not have a FH of hypercalcemia, pituitary tumors, thyroid cancer, or osteoporosis.  Mother had partial thyroidectomy (? Dx).  She lost 30 lbs in October 05, 2016 - [employer].  In the past, we discussed about a plant-based diet and I gave her references.    1 of her sisters developed a brain tumor at 73 years old and died from it at 57 years old. Another sister died of lung cancer in 10/06/19.   ROS: Constitutional: no weight gain/no weight loss, no fatigue, no subjective hyperthermia, no subjective hypothermia Eyes: no blurry vision, no xerophthalmia ENT: no sore throat, no nodules palpated in neck, no dysphagia, no odynophagia, no hoarseness Cardiovascular: no CP/no SOB/no palpitations/no leg swelling Respiratory: no cough/no SOB/no wheezing Gastrointestinal: no N/no V/no D/no C/no acid reflux Musculoskeletal: no muscle aches/no joint aches Skin: no rashes, no hair loss Neurological: no tremors/no numbness/no tingling/no dizziness  I reviewed pt's medications, allergies, PMH, social hx, family hx, and changes were documented in the history of present illness. Otherwise, unchanged from my initial visit note.  Past Medical History:  Diagnosis Date  . Abnormal Pap smear    ASCUS  . Asthma    exercise and cold(weather) induced  . Concussion 06/24/14   improved  . Dyspareunia   . Fibrocystic breast changes   . GERD (gastroesophageal reflux disease)    in past  . History of renal calculi July 2012   s/p lithotripsy [place]  . Hypertension       . Kidney stones   . 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 Pelvic pain in female   . Pelvic relaxation   .  Vaginitis    Past Surgical History:  Procedure Laterality Date  . CESAREAN SECTION    . CHOLECYSTECTOMY    . COLONOSCOPY WITH PROPOFOL N/A 10/11/2015   Procedure: COLONOSCOPY WITH PROPOFOL;  Surgeon: Lucilla Lame, MD;  Location: [facility];  Service: Endoscopy;  Laterality: N/A;  latex sensitivity  . OOPHORECTOMY Right 10-06-02   due to large benign cyst,  elevated CA125  . POLYPECTOMY  10/11/2015   Procedure: POLYPECTOMY;  Surgeon: Lucilla Lame, MD;  Location: [facility];  Service: Endoscopy;;   Social History   Socioeconomic History  . Marital status: Married    Spouse name: Not on file  . Number of children: 2  . Years of education: Not on file  .  Highest education level: Not on file  Occupational History  .  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 Financial resource strain: Not on file  . Food insecurity:    Worry: Not on file    Inability: Not on file  . Transportation needs:    Medical: Not on file    Non-medical: Not on file  Tobacco Use  . Smoking status: Never Smoker  . Smokeless tobacco: Never Used  Substance and Sexual Activity  . Alcohol use: No  . Drug use: No  . Sexual activity: Yes    Birth control/protection: Surgical    Comment: vas.  Lifestyle  . Physical activity:    Days per week: Not on file    Minutes per session: Not on file  . Stress: Not on file  Relationships  . Social connections:    Talks on phone: Not on file    Gets together: Not on file    Attends religious service: Not on file    Active member of club or organization: Not on file    Attends meetings of clubs or organizations: Not on file    Relationship status: Not on file  . Intimate partner violence:    Fear of current or ex partner: Not on file    Emotionally abused: Not on file    Physically abused: Not on file    Forced sexual activity: Not on file  Other Topics Concern  . Not on file  Social History Narrative   Married    Current Outpatient Medications on  File Prior to Visit  Medication Sig Dispense Refill  . albuterol (VENTOLIN HFA) 108 (90 Base) MCG/ACT inhaler INHALE TWO PUFFS 4 TIMES DAILY AS NEEDED FOR  WHEEZING 18 g 3  . Cholecalciferol (VITAMIN D3) 1000 units CAPS Take 4,000 Int'l Units by mouth.    . indapamide (LOZOL) 1.25 MG tablet 1.25 mg. (Patient not taking: Reported on 07/05/2020)    . Loratadine 10 MG CAPS Take by mouth.    . losartan (COZAAR) 50 MG tablet TAKE 1 TABLET DAILY 90 tablet 3  . predniSONE (DELTASONE) 10 MG tablet Take 4 tablets ( total 40 mg) by mouth for 2 days; take 3 tablets ( total 30 mg) by mouth for 2 days; take 2 tablets ( total 20 mg) by mouth for 1 day; take 1 tablet ( total 10 mg) by mouth for 1 day. (Patient not taking: Reported on 07/05/2020) 17 tablet 0  . Probiotic Product (PROBIOTIC-10 PO) Take by mouth. (Patient not taking: No sig reported)     No current facility-administered medications on file prior to visit.   Allergies  Allergen Reactions  . Amoxicillin Rash  . Latex Rash    Burning after internal ultrasound  . Monistat [Miconazole] Rash    Itching and burning  . Septra [Bactrim] Rash  . Sulfa Antibiotics Rash  . Sulfamethoxazole-Trimethoprim Rash   Family History  Problem Relation Age of Onset  . Hypertension Father   . Stroke Father   . Hypertension Mother   . Hyperlipidemia Mother   . Cancer Sister        brain tumor    PE: BP 128/90 (BP Location: Right Arm, Patient Position: Sitting, Cuff Size: Normal)   Pulse 91   Ht 5\' 7"  (1.702 m)   Wt 180 lb 9.6 oz (81.9 kg)   SpO2 98%   BMI 28.29 kg/m  Wt Readings from Last 3 Encounters:  10/03/20 180 lb 9.6 oz (81.9 kg)  04/04/20 169 lb 12.8 oz (77 kg)  11/20/19 172 lb (78 kg)   Constitutional: + slightly overweight, in NAD Eyes: PERRLA, EOMI, no exophthalmos ENT: moist mucous membranes, no thyromegaly, no cervical lymphadenopathy Cardiovascular: RRR, No MRG Respiratory: CTA B Gastrointestinal: abdomen soft, NT, ND,  BS+ Musculoskeletal: no deformities, strength intact in all 4 Skin: moist, warm, no rashes Neurological: no tremor with outstretched hands, DTR normal in all 4  Assessment: 1. Hypercalcemia/hyperparathyroidism  2.  Vitamin D deficiency  Plan: Patient with history of elevated calcium with the highest level being 10.8.  Intact PTH was slightly high, at 71, but at that time the corresponding calcium was in the normal range, at 9.8.  She was also found to have an elevated 24-hour urine calcium in the urologist office and she was started on indapamide.  To further investigate her hyperparathyroidism, we stopped indapamide in 11/2018.  She prefers to stay off the medication.  PTH was lower at lites visit, at 19, for normal calcium level of 10.1.  Vitamin D level was still slightly low at that time and we increased the dose.  Of note, phosphorus, magnesium, and calcitriol levels were normal. -We checked a DXA scan and this showed only mild osteopenia -She does not osteoporosis or but does have kidney stones x2, along with a 24-hour urine calcium >400 mg/d -Earlier this month, he finally had a technetium sestamibi scan and this showed a left inferior parathyroid adenoma -We again discussed about possible consequences of hyperparathyroidism to include osteoporosis and kidney stones -She meets the criteria for surgery (shown in bold): Marland Kitchen Increased calcium by more than 1 mg/dL above the upper limit of normal  . Kidney ds.  . Osteoporosis (or vertebral fracture) . Age <77 years old 2013 criteria: . High UCa >400 mg/d and increased stone risk by biochemical stone risk analysis . Presence of nephrolithiasis or nephrocalcinosis . Pt's preference! -At this visit, I will refer her to surgery - I will see the patient back in 6 months  2.  Vitamin D deficiency -Patient with persistently slightly low vitamin D -At last visit she was on 2000 units daily and I advised her to increase to 4000 units daily.  She  continues on this dose today -We will recheck the level today  Component     Latest Ref Rng & Units 10/03/2020          Sodium     135 - 145 mEq/L 141  Potassium     3.5 - 5.1 mEq/L 3.8  Chloride     96 - 112 mEq/L 105  CO2     19 - 32 mEq/L 29  Glucose     70 - 99 mg/dL 123 (H)  BUN     6 - 23 mg/dL 13  Creatinine     0.40 - 1.20 mg/dL 0.76  Calcium     8.4 - 10.5 mg/dL 10.3  GFR     >60.00 mL/min 88.02  PTH, Intact     15 - 65 pg/mL 27  Vitamin D, 25-Hydroxy     30.0 - 100.0 ng/mL 37.0   Calcium, PTH, and vitamin D level is now normal. I would still suggest a referral to Sx.  Philemon Kingdom, MD PhD [facility] Endocrinology

## 2020-10-04 LAB — VITAMIN D 25 HYDROXY (VIT D DEFICIENCY, FRACTURES): Vit D, 25-Hydroxy: 37 ng/mL (ref 30.0–100.0)

## 2020-10-04 LAB — PARATHYROID HORMONE, INTACT (NO CA): PTH: 27 pg/mL (ref 15–65)

## 2020-10-08 ENCOUNTER — Encounter: Payer: Self-pay | Admitting: Gastroenterology

## 2020-10-08 ENCOUNTER — Other Ambulatory Visit: Payer: Self-pay

## 2020-10-09 ENCOUNTER — Other Ambulatory Visit: Payer: Self-pay

## 2020-10-09 DIAGNOSIS — Z86010 Personal history of colonic polyps: Secondary | ICD-10-CM

## 2020-10-09 MED ORDER — PEG 3350-KCL-NA BICARB-NACL 420 G PO SOLR: 4000.0000 mL | Freq: Once | ORAL | 0 refills | Status: AC

## 2020-10-10 NOTE — Discharge Instructions (Signed)

## 2020-10-11 ENCOUNTER — Other Ambulatory Visit: Payer: Self-pay

## 2020-10-11 ENCOUNTER — Encounter: Payer: Self-pay | Admitting: Gastroenterology

## 2020-10-11 ENCOUNTER — Ambulatory Visit
Admission: RE | Admit: 2020-10-11 | Discharge: 2020-10-11 | Disposition: A | Discharge status: Home / Self Care | Payer: BC Managed Care – PPO | Attending: Gastroenterology | Admitting: Gastroenterology

## 2020-10-11 ENCOUNTER — Ambulatory Visit: Payer: BC Managed Care – PPO | Admitting: Anesthesiology

## 2020-10-11 ENCOUNTER — Encounter
Admission: RE | Disposition: A | Discharge status: Home / Self Care | Payer: Self-pay | Source: Home / Self Care | Attending: Gastroenterology

## 2020-10-11 DIAGNOSIS — Z881 Allergy status to other antibiotic agents status: Secondary | ICD-10-CM | POA: Diagnosis not present

## 2020-10-11 DIAGNOSIS — Z888 Allergy status to other drugs, medicaments and biological substances status: Secondary | ICD-10-CM | POA: Diagnosis not present

## 2020-10-11 DIAGNOSIS — I10 Essential (primary) hypertension: Secondary | ICD-10-CM | POA: Diagnosis not present

## 2020-10-11 DIAGNOSIS — Z860100 Personal history of colon polyps, unspecified: Secondary | ICD-10-CM

## 2020-10-11 DIAGNOSIS — K640 First degree hemorrhoids: Secondary | ICD-10-CM | POA: Diagnosis not present

## 2020-10-11 DIAGNOSIS — Z8616 Personal history of COVID-19: Secondary | ICD-10-CM | POA: Insufficient documentation

## 2020-10-11 DIAGNOSIS — K635 Polyp of colon: Secondary | ICD-10-CM

## 2020-10-11 DIAGNOSIS — Z882 Allergy status to sulfonamides status: Secondary | ICD-10-CM | POA: Insufficient documentation

## 2020-10-11 DIAGNOSIS — D120 Benign neoplasm of cecum: Secondary | ICD-10-CM | POA: Diagnosis not present

## 2020-10-11 DIAGNOSIS — K621 Rectal polyp: Secondary | ICD-10-CM | POA: Diagnosis not present

## 2020-10-11 DIAGNOSIS — Z91040 Latex allergy status: Secondary | ICD-10-CM | POA: Diagnosis not present

## 2020-10-11 DIAGNOSIS — Z1211 Encounter for screening for malignant neoplasm of colon: Secondary | ICD-10-CM | POA: Diagnosis not present

## 2020-10-11 DIAGNOSIS — J45909 Unspecified asthma, uncomplicated: Secondary | ICD-10-CM | POA: Insufficient documentation

## 2020-10-11 DIAGNOSIS — Z808 Family history of malignant neoplasm of other organs or systems: Secondary | ICD-10-CM | POA: Insufficient documentation

## 2020-10-11 DIAGNOSIS — Z8349 Family history of other endocrine, nutritional and metabolic diseases: Secondary | ICD-10-CM | POA: Diagnosis not present

## 2020-10-11 DIAGNOSIS — Z79899 Other long term (current) drug therapy: Secondary | ICD-10-CM | POA: Diagnosis not present

## 2020-10-11 DIAGNOSIS — Z7951 Long term (current) use of inhaled steroids: Secondary | ICD-10-CM | POA: Insufficient documentation

## 2020-10-11 DIAGNOSIS — Z823 Family history of stroke: Secondary | ICD-10-CM | POA: Diagnosis not present

## 2020-10-11 DIAGNOSIS — Z8249 Family history of ischemic heart disease and other diseases of the circulatory system: Secondary | ICD-10-CM | POA: Insufficient documentation

## 2020-10-11 DIAGNOSIS — Z880 Allergy status to penicillin: Secondary | ICD-10-CM | POA: Insufficient documentation

## 2020-10-11 DIAGNOSIS — D128 Benign neoplasm of rectum: Secondary | ICD-10-CM | POA: Diagnosis not present

## 2020-10-11 HISTORY — PX: POLYPECTOMY: SHX5525

## 2020-10-11 HISTORY — PX: COLONOSCOPY WITH PROPOFOL: SHX5780

## 2020-10-11 LAB — POCT PREGNANCY, URINE: Preg Test, Ur: NEGATIVE

## 2020-10-11 SURGERY — COLONOSCOPY WITH PROPOFOL
Anesthesia: General | Site: Rectum

## 2020-10-11 MED ORDER — ACETAMINOPHEN 160 MG/5ML PO SOLN: 325.0000 mg | ORAL | Status: DC | PRN

## 2020-10-11 MED ORDER — LACTATED RINGERS IV SOLN: INTRAVENOUS | Status: DC

## 2020-10-11 MED ORDER — LIDOCAINE HCL (CARDIAC) PF 100 MG/5ML IV SOSY
PREFILLED_SYRINGE | INTRAVENOUS | Status: DC | PRN
  Administered 2020-10-11: 50 mg via INTRAVENOUS

## 2020-10-11 MED ORDER — PROPOFOL 10 MG/ML IV BOLUS
INTRAVENOUS | Status: DC | PRN
  Administered 2020-10-11: 20 mg via INTRAVENOUS
  Administered 2020-10-11: 40 mg via INTRAVENOUS
  Administered 2020-10-11: 80 mg via INTRAVENOUS
  Administered 2020-10-11: 30 mg via INTRAVENOUS
  Administered 2020-10-11: 20 mg via INTRAVENOUS
  Administered 2020-10-11: 30 mg via INTRAVENOUS

## 2020-10-11 MED ORDER — SODIUM CHLORIDE 0.9 % IV SOLN: INTRAVENOUS | Status: DC

## 2020-10-11 MED ORDER — STERILE WATER FOR IRRIGATION IR SOLN: Status: DC | PRN

## 2020-10-11 MED ORDER — ACETAMINOPHEN 325 MG PO TABS
325.0000 mg | ORAL_TABLET | ORAL | Status: DC | PRN
Start: 2020-10-11 — End: 2020-10-11

## 2020-10-11 SURGICAL SUPPLY — 7 items
FORCEPS BIOP RAD 4 LRG CAP 4 (CUTTING FORCEPS) ×1 IMPLANT
GOWN CVR UNV OPN BCK APRN NK (MISCELLANEOUS) ×2 IMPLANT
GOWN ISOL THUMB LOOP REG UNIV (MISCELLANEOUS) ×4
KIT PRC NS LF DISP ENDO (KITS) ×1 IMPLANT
KIT PROCEDURE OLYMPUS (KITS) ×2
MANIFOLD NEPTUNE II (INSTRUMENTS) ×2 IMPLANT
WATER STERILE IRR 250ML POUR (IV SOLUTION) ×2 IMPLANT

## 2020-10-11 NOTE — Anesthesia Procedure Notes (Signed)
Performed by: Knute Mazzuca, CRNA Pre-anesthesia Checklist: Patient identified, Emergency Drugs available, Suction available, Timeout performed and Patient being monitored Patient Re-evaluated:Patient Re-evaluated prior to induction Oxygen Delivery Method: Nasal cannula Placement Confirmation: positive ETCO2       

## 2020-10-11 NOTE — Anesthesia Preprocedure Evaluation (Signed)
Anesthesia Evaluation  Patient identified by MRN, date of birth, ID band Patient awake    Reviewed: Allergy & Precautions, H&P , NPO status , Patient's Chart, lab work & pertinent test results, reviewed documented beta blocker date and time   Airway Mallampati: II  TM Distance: >3 FB Neck ROM: full    Dental no notable dental hx.    Pulmonary asthma ,  Cold/exercise induced asthma   Pulmonary exam normal breath sounds clear to auscultation       Cardiovascular Exercise Tolerance: Good hypertension,  Rhythm:regular Rate:Normal     Neuro/Psych negative neurological ROS  negative psych ROS   GI/Hepatic Neg liver ROS, GERD  ,  Endo/Other  negative endocrine ROS  Renal/GU Renal diseaseNephrolithiasis  negative genitourinary   Musculoskeletal   Abdominal   Peds  Hematology negative hematology ROS (+)   Anesthesia Other Findings   Reproductive/Obstetrics negative OB ROS                             Anesthesia Physical Anesthesia Plan  ASA: II  Anesthesia Plan: General   Post-op Pain Management:    Induction:   PONV Risk Score and Plan:   Airway Management Planned:   Additional Equipment:   Intra-op Plan:   Post-operative Plan:   Informed Consent: I have reviewed the patients History and Physical, chart, labs and discussed the procedure including the risks, benefits and alternatives for the proposed anesthesia with the patient or authorized representative who has indicated his/her understanding and acceptance.     Dental Advisory Given  Plan Discussed with: CRNA  Anesthesia Plan Comments:         Anesthesia Quick Evaluation

## 2020-10-11 NOTE — Transfer of Care (Signed)
Immediate Anesthesia Transfer of Care Note  Patient: Dawn Russell  Procedure(s) Performed: COLONOSCOPY WITH BIOPSY (N/A Rectum) POLYPECTOMY (N/A Rectum)  Patient Location: PACU  Anesthesia Type: General  Level of Consciousness: awake, alert  and patient cooperative  Airway and Oxygen Therapy: Patient Spontanous Breathing   Post-op Assessment: Post-op Vital signs reviewed, Patient's Cardiovascular Status Stable, Respiratory Function Stable, Patent Airway and No signs of Nausea or vomiting  Post-op Vital Signs: Reviewed and stable  Complications: No complications documented.

## 2020-10-11 NOTE — Op Note (Signed)
[facility] Gastroenterology Patient Name: Dawn Russell Procedure Date: 10/11/2020 10:34 AM MRN: [id number] Account #: [id number] Date of Birth: 12/24/1965 Admit Type: Outpatient Age: 55 Room: [facility] OR ROOM 01 Gender: Female Note Status: Finalized Procedure:             Colonoscopy Indications:           High risk colon cancer surveillance: Personal history                         of colonic polyps Providers:             Lucilla Lame MD, MD Referring MD:          Deborra Medina, MD (Referring MD) Medicines:             Propofol per Anesthesia Complications:         No immediate complications. Procedure:             Pre-Anesthesia Assessment:                        - Prior to the procedure, a History and Physical was                         performed, and patient medications and allergies were                         reviewed. The patient's tolerance of previous                         anesthesia was also reviewed. The risks and benefits                         of the procedure and the sedation options and risks                         were discussed with the patient. All questions were                         answered, and informed consent was obtained. Prior                         Anticoagulants: The patient has taken no previous                         anticoagulant or antiplatelet agents. ASA Grade                         Assessment: II - A patient with mild systemic disease.                         After reviewing the risks and benefits, the patient                         was deemed in satisfactory condition to undergo the                         procedure.  After obtaining informed consent, the colonoscope was                         passed under direct vision. Throughout the procedure,                         the patient's blood pressure, pulse, and oxygen                         saturations were monitored continuously. The                          Colonoscope was introduced through the anus and                         advanced to the the cecum, identified by appendiceal                         orifice and ileocecal valve. The colonoscopy was                         performed without difficulty. The patient tolerated                         the procedure well. The quality of the bowel                         preparation was excellent. Findings:      The perianal and digital rectal examinations were normal.      A 2 mm polyp was found in the cecum. The polyp was sessile. The polyp       was removed with a cold biopsy forceps. Resection and retrieval were       complete.      A 4 mm polyp was found in the rectum. The polyp was sessile. The polyp       was removed with a cold biopsy forceps. Resection and retrieval were       complete.      Non-bleeding internal hemorrhoids were found during retroflexion. The       hemorrhoids were Grade I (internal hemorrhoids that do not prolapse). Impression:            - One 2 mm polyp in the cecum, removed with a cold                         biopsy forceps. Resected and retrieved.                        - One 4 mm polyp in the rectum, removed with a cold                         biopsy forceps. Resected and retrieved.                        - Non-bleeding internal hemorrhoids. Recommendation:        - Discharge patient to home.                        - Resume previous diet.                        -  Continue present medications.                        - Await pathology results.                        - Repeat colonoscopy in 5 years for surveillance. Procedure Code(s):     --- Professional ---                        [phone], Colonoscopy, flexible; with biopsy, single or                         multiple Diagnosis Code(s):     --- Professional ---                        Z86.010, Personal history of colonic polyps                        K63.5, Polyp of colon CPT copyright 2019 American  Medical Association. All rights reserved. The codes documented in this report are preliminary and upon coder review may  be revised to meet current compliance requirements. Lucilla Lame MD, MD 10/11/2020 10:59:33 AM This report has been signed electronically. Number of Addenda: 0 Note Initiated On: 10/11/2020 10:34 AM Scope Withdrawal Time: 0 hours 7 minutes 14 seconds  Total Procedure Duration: 0 hours 11 minutes 12 seconds  Estimated Blood Loss:  Estimated blood loss: none.      [facility]

## 2020-10-11 NOTE — Anesthesia Postprocedure Evaluation (Signed)
Anesthesia Post Note  Patient: Dawn Russell  Procedure(s) Performed: COLONOSCOPY WITH BIOPSY (N/A Rectum) POLYPECTOMY (N/A Rectum)     Patient location during evaluation: PACU Anesthesia Type: General Level of consciousness: awake and alert Pain management: pain level controlled Vital Signs Assessment: post-procedure vital signs reviewed and stable Respiratory status: spontaneous breathing, nonlabored ventilation, respiratory function stable and patient connected to nasal cannula oxygen Cardiovascular status: blood pressure returned to baseline and stable Postop Assessment: no apparent nausea or vomiting Anesthetic complications: no   No complications documented.  Trecia Rogers

## 2020-10-11 NOTE — H&P (Signed)
Lucilla Lame, MD [facility] [place], [place] [address] Phone:[phone] Fax : [phone]  Primary Care Physician:  Crecencio Mc, MD Primary Gastroenterologist:  Dr. Allen Norris  Pre-Procedure History & Physical: HPI:  Dawn Russell is a 55 y.o. female is here for an colonoscopy.   Past Medical History:  Diagnosis Date  . Abnormal Pap smear    ASCUS  . Asthma    exercise and cold(weather) induced  . Concussion 06/24/14   improved  . COVID-19 05/2019  . Dyspareunia   . Fibrocystic breast changes   . GERD (gastroesophageal reflux disease)    in past  . History of renal calculi July 2012   s/p lithotripsy [place]  . Hypertension       . Kidney stones   . 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 Pelvic pain in female   . Pelvic relaxation   . Vaginitis     Past Surgical History:  Procedure Laterality Date  . CESAREAN SECTION    . CHOLECYSTECTOMY    . COLONOSCOPY WITH PROPOFOL N/A 10/11/2015   Procedure: COLONOSCOPY WITH PROPOFOL;  Surgeon: Lucilla Lame, MD;  Location: [facility];  Service: Endoscopy;  Laterality: N/A;  latex sensitivity  . OOPHORECTOMY Right 2004   due to large benign cyst,  elevated CA125  . POLYPECTOMY  10/11/2015   Procedure: POLYPECTOMY;  Surgeon: Lucilla Lame, MD;  Location: [facility];  Service: Endoscopy;;    Prior to Admission medications   Medication Sig Start Date End Date Taking? Authorizing Provider  albuterol (VENTOLIN HFA) 108 (90 Base) MCG/ACT inhaler INHALE TWO PUFFS 4 TIMES DAILY AS NEEDED FOR  WHEEZING 09/02/18  Yes Crecencio Mc, MD  Cholecalciferol (VITAMIN D3) 1000 units CAPS Take 4,000 Int'l Units by mouth.   Yes [provider]  Loratadine 10 MG CAPS Take by mouth.   Yes [provider]  losartan (COZAAR) 50 MG tablet TAKE 1 TABLET DAILY 07/25/20  Yes Crecencio Mc, MD  indapamide (LOZOL) 1.25 MG tablet 1.25 mg. Patient not taking: No sig  reported 08/22/18   [provider]    Allergies as of 07/05/2020 - Review Complete 07/05/2020  Allergen Reaction Noted  . Amoxicillin Rash 03/30/2011  . Latex Rash 07/23/2011  . Monistat [miconazole] Rash 01/12/2012  . Septra [bactrim] Rash 03/30/2011  . Sulfa antibiotics Rash   . Sulfamethoxazole-trimethoprim Rash 03/30/2011    Family History  Problem Relation Age of Onset  . Hypertension Father   . Stroke Father   . Hypertension Mother   . Hyperlipidemia Mother   . Cancer Sister        brain tumor    Social History   Socioeconomic History  . Marital status: Married    Spouse name: Not on file  . Number of children: Not on file  . Years of education: Not on file  . Highest education level: Not on file  Occupational History  . Not on file  Tobacco Use  . Smoking status: Never Smoker  . Smokeless tobacco: Never Used  Vaping Use  . Vaping Use: Never used  Substance and Sexual Activity  . Alcohol use: No  . Drug use: No  . Sexual activity: Yes    Birth control/protection: Surgical    Comment: vas.  Other Topics Concern  . Not on file  Social History Narrative   Married    Social Determinants of Health   Financial Resource Strain: Not  on file  Food Insecurity: Not on file  Transportation Needs: Not on file  Physical Activity: Not on file  Stress: Not on file  Social Connections: Not on file  Intimate Partner Violence: Not on file    Review of Systems: See HPI, otherwise negative ROS  Physical Exam: BP (!) 149/74   Pulse 94   Temp (!) 97 F (36.1 C) (Temporal)   Resp 18   Ht 5\' 7"  (1.702 m)   Wt 80.7 kg   LMP 09/06/2020 (Exact Date)   SpO2 97%   BMI 27.88 kg/m  General:   Alert,  pleasant and cooperative in NAD Head:  Normocephalic and atraumatic. Neck:  Supple; no masses or thyromegaly. Lungs:  Clear throughout to auscultation.    Heart:  Regular rate and rhythm. Abdomen:  Soft, nontender and nondistended. Normal bowel sounds,  without guarding, and without rebound.   Neurologic:  Alert and  oriented x4;  grossly normal neurologically.  Impression/Plan: Dawn Russell is here for an colonoscopy to be performed for a history of adenomatous polyps on 10/2015   Risks, benefits, limitations, and alternatives regarding  colonoscopy have been reviewed with the patient.  Questions have been answered.  All parties agreeable.   Lucilla Lame, MD  10/11/2020, 10:10 AM

## 2020-10-14 ENCOUNTER — Encounter: Payer: Self-pay | Admitting: Gastroenterology

## 2020-10-15 LAB — SURGICAL PATHOLOGY

## 2020-10-17 ENCOUNTER — Encounter: Payer: Self-pay | Admitting: Gastroenterology

## 2020-11-13 ENCOUNTER — Ambulatory Visit: Payer: Self-pay | Admitting: Surgery

## 2020-11-13 DIAGNOSIS — E210 Primary hyperparathyroidism: Secondary | ICD-10-CM | POA: Diagnosis not present

## 2020-11-13 DIAGNOSIS — Z87442 Personal history of urinary calculi: Secondary | ICD-10-CM | POA: Diagnosis not present

## 2020-11-13 DIAGNOSIS — M8580 Other specified disorders of bone density and structure, unspecified site: Secondary | ICD-10-CM | POA: Diagnosis not present

## 2020-11-19 ENCOUNTER — Other Ambulatory Visit: Payer: Self-pay | Admitting: Surgery

## 2020-11-19 DIAGNOSIS — E210 Primary hyperparathyroidism: Secondary | ICD-10-CM

## 2020-11-22 NOTE — Progress Notes (Signed)
DUE TO COVID-19 ONLY ONE VISITOR IS ALLOWED TO COME WITH YOU AND STAY IN THE WAITING ROOM ONLY DURING PRE OP AND PROCEDURE DAY OF SURGERY. THE 1 VISITOR  MAY VISIT WITH YOU AFTER SURGERY IN YOUR PRIVATE ROOM DURING VISITING HOURS ONLY!  YOU NEED TO HAVE A COVID 19 TEST ON_______ @_______ , THIS TEST MUST BE DONE BEFORE SURGERY,  COVID TESTING SITE [address] [place] [address], IT IS ON THE RIGHT GOING OUT [address] APPROXIMATELY  2 MINUTES PAST [employer] ON THE RIGHT. ONCE YOUR COVID TEST IS COMPLETED,  PLEASE BEGIN THE QUARANTINE INSTRUCTIONS AS OUTLINED IN YOUR HANDOUT.                Dawn Russell  11/22/2020   Your procedure is scheduled on:            12/02/2020   Report to [facility] Main  Entrance   Report to admitting at     0515am      Call this number if you have problems the morning of surgery [phone]    REMEMBER: NO  SOLID FOOD CANDY OR GUM AFTER MIDNIGHT. CLEAR LIQUIDS UNTIL     0415am        . NOTHING BY MOUTH EXCEPT CLEAR LIQUIDS UNTIL    0415am  . PLEASE FINISH ENSURE DRINK PER SURGEON ORDER  WHICH NEEDS TO BE COMPLETED AT0415am       .      CLEAR LIQUID DIET   Foods Allowed                                                                    Coffee and tea, regular and decaf                            Fruit ices (not with fruit pulp)                                      Iced Popsicles                                    Carbonated beverages, regular and diet                                    Cranberry, grape and apple juices Sports drinks like Gatorade Lightly seasoned clear broth or consume(fat free) Sugar, honey syrup ___________________________________________________________________      BRUSH YOUR TEETH MORNING OF SURGERY AND RINSE YOUR MOUTH OUT, NO CHEWING GUM CANDY OR MINTS.     Take these medicines the morning of surgery with A SIP OF WATER:  Inhalers as usual and bring  DO NOT TAKE ANY DIABETIC  MEDICATIONS DAY OF YOUR SURGERY                               You may not have any metal on your body including hair  pins and              piercings  Do not wear jewelry, make-up, lotions, powders or perfumes, deodorant             Do not wear nail polish on your fingernails.  Do not shave  48 hours prior to surgery.              Men may shave face and neck.   Do not bring valuables to the hospital. [place].  Contacts, dentures or bridgework may not be worn into surgery.  Leave suitcase in the car. After surgery it may be brought to your room.     Patients discharged the day of surgery will not be allowed to drive home. IF YOU ARE HAVING SURGERY AND GOING HOME THE SAME DAY, YOU MUST HAVE AN ADULT TO DRIVE YOU HOME AND BE WITH YOU FOR 24 HOURS. YOU MAY GO HOME BY TAXI OR UBER OR ORTHERWISE, BUT AN ADULT MUST ACCOMPANY YOU HOME AND STAY WITH YOU FOR 24 HOURS.  Name and phone number of your driver:  Special Instructions: N/A              Please read over the following fact sheets you were given: _____________________________________________________________________  [facility] - Preparing for Surgery Before surgery, you can play an important role.  Because skin is not sterile, your skin needs to be as free of germs as possible.  You can reduce the number of germs on your skin by washing with CHG (chlorahexidine gluconate) soap before surgery.  CHG is an antiseptic cleaner which kills germs and bonds with the skin to continue killing germs even after washing. Please DO NOT use if you have an allergy to CHG or antibacterial soaps.  If your skin becomes reddened/irritated stop using the CHG and inform your nurse when you arrive at Short Stay. Do not shave (including legs and underarms) for at least 48 hours prior to the first CHG shower.  You may shave your face/neck. Please follow these instructions carefully:  1.  Shower with CHG Soap the night  before surgery and the  morning of Surgery.  2.  If you choose to wash your hair, wash your hair first as usual with your  normal  shampoo.  3.  After you shampoo, rinse your hair and body thoroughly to remove the  shampoo.                           4.  Use CHG as you would any other liquid soap.  You can apply chg directly  to the skin and wash                       Gently with a scrungie or clean washcloth.  5.  Apply the CHG Soap to your body ONLY FROM THE NECK DOWN.   Do not use on face/ open                           Wound or open sores. Avoid contact with eyes, ears mouth and genitals (private parts).                       Wash face,  Genitals (private parts) with your  normal soap.             6.  Wash thoroughly, paying special attention to the area where your surgery  will be performed.  7.  Thoroughly rinse your body with warm water from the neck down.  8.  DO NOT shower/wash with your normal soap after using and rinsing off  the CHG Soap.                9.  Pat yourself dry with a clean towel.            10.  Wear clean pajamas.            11.  Place clean sheets on your bed the night of your first shower and do not  sleep with pets. Day of Surgery : Do not apply any lotions/deodorants the morning of surgery.  Please wear clean clothes to the hospital/surgery center.  FAILURE TO FOLLOW THESE INSTRUCTIONS MAY RESULT IN THE CANCELLATION OF YOUR SURGERY PATIENT SIGNATURE_________________________________  NURSE SIGNATURE__________________________________  ________________________________________________________________________

## 2020-11-26 ENCOUNTER — Other Ambulatory Visit: Payer: Self-pay

## 2020-11-26 ENCOUNTER — Encounter (HOSPITAL_COMMUNITY)
Admission: RE | Admit: 2020-11-26 | Discharge: 2020-11-26 | Disposition: A | Discharge status: Home / Self Care | Payer: BC Managed Care – PPO | Source: Ambulatory Visit | Attending: Surgery | Admitting: Surgery

## 2020-11-26 ENCOUNTER — Encounter (HOSPITAL_COMMUNITY): Payer: Self-pay

## 2020-11-26 DIAGNOSIS — Z01818 Encounter for other preprocedural examination: Secondary | ICD-10-CM | POA: Diagnosis not present

## 2020-11-26 HISTORY — DX: Personal history of urinary calculi: Z87.442

## 2020-11-26 LAB — BASIC METABOLIC PANEL
Anion gap: 5 (ref 5–15)
BUN: 11 mg/dL (ref 6–20)
CO2: 28 mmol/L (ref 22–32)
Calcium: 9.5 mg/dL (ref 8.9–10.3)
Chloride: 109 mmol/L (ref 98–111)
Creatinine, Ser: 0.63 mg/dL (ref 0.44–1.00)
GFR, Estimated: >60 mL/min (ref >60)
Glucose, Bld: 115 mg/dL — ABNORMAL HIGH (ref 70–99)
Potassium: 3.9 mmol/L (ref 3.5–5.1)
Sodium: 142 mmol/L (ref 135–145)

## 2020-11-26 LAB — CBC
HCT: 43.7 % (ref 36.0–46.0)
Hemoglobin: 14.2 g/dL (ref 12.0–15.0)
MCH: 27.6 pg (ref 26.0–34.0)
MCHC: 32.5 g/dL (ref 30.0–36.0)
MCV: 84.9 fL (ref 80.0–100.0)
Platelets: 260 10*3/uL (ref 150–400)
RBC: 5.15 MIL/uL — ABNORMAL HIGH (ref 3.87–5.11)
RDW: 13.3 % (ref 11.5–15.5)
WBC: 7.7 10*3/uL (ref 4.0–10.5)
nRBC: 0 % (ref 0.0–0.2)

## 2020-11-26 NOTE — Progress Notes (Signed)
Anesthesia Review:  PCP: DR Derrel Nip  Cardiologist : Chest x-ray : EKG :11/26/20  Echo : Stress test: Cardiac Cath :  Activity level: can do a flight of stairs without difficulty  Sleep Study/ CPAP : none  Fasting Blood Sugar :      / Checks Blood Sugar -- times a day:   Blood Thinner/ Instructions /Last Dose: ASA / Instructions/ Last Dose :

## 2020-11-28 ENCOUNTER — Ambulatory Visit
Admission: RE | Admit: 2020-11-28 | Discharge: 2020-11-28 | Disposition: A | Discharge status: Home / Self Care | Payer: BC Managed Care – PPO | Source: Ambulatory Visit | Attending: Surgery | Admitting: Surgery

## 2020-11-28 DIAGNOSIS — E210 Primary hyperparathyroidism: Secondary | ICD-10-CM

## 2020-11-28 DIAGNOSIS — E042 Nontoxic multinodular goiter: Secondary | ICD-10-CM | POA: Diagnosis not present

## 2020-11-29 NOTE — Anesthesia Preprocedure Evaluation (Addendum)
Anesthesia Evaluation  Patient identified by MRN, date of birth, ID band Patient awake    Reviewed: Allergy & Precautions, NPO status , Patient's Chart, lab work & pertinent test results  Airway Mallampati: I  TM Distance: >3 FB Neck ROM: Full    Dental no notable dental hx. (+) Teeth Intact, Dental Advisory Given   Pulmonary asthma (exercise induced) ,  Rescue inhaler last used 81mo ago   Pulmonary exam normal breath sounds clear to auscultation       Cardiovascular hypertension (164/77 in preop, normally 120-130 SBP per pt), Pt. on medications Normal cardiovascular exam Rhythm:Regular Rate:Normal     Neuro/Psych negative neurological ROS  negative psych ROS   GI/Hepatic negative GI ROS, Neg liver ROS,   Endo/Other  Primary hyperparathyroidism   Renal/GU   negative genitourinary   Musculoskeletal negative musculoskeletal ROS (+)   Abdominal   Peds  Hematology negative hematology ROS (+) hct 43.7   Anesthesia Other Findings   Reproductive/Obstetrics negative OB ROS                            Anesthesia Physical Anesthesia Plan  ASA: 2  Anesthesia Plan: General   Post-op Pain Management:    Induction: Intravenous  PONV Risk Score and Plan: 4 or greater and Ondansetron, Dexamethasone, Midazolam, Scopolamine patch - Pre-op and Treatment may vary due to age or medical condition  Airway Management Planned: Oral ETT  Additional Equipment: None  Intra-op Plan:   Post-operative Plan: Extubation in OR  Informed Consent: I have reviewed the patients History and Physical, chart, labs and discussed the procedure including the risks, benefits and alternatives for the proposed anesthesia with the patient or authorized representative who has indicated his/her understanding and acceptance.     Dental advisory given  Plan Discussed with: CRNA  Anesthesia Plan Comments:         Anesthesia Quick Evaluation

## 2020-12-01 ENCOUNTER — Encounter (HOSPITAL_COMMUNITY): Payer: Self-pay | Admitting: Surgery

## 2020-12-01 DIAGNOSIS — M8580 Other specified disorders of bone density and structure, unspecified site: Secondary | ICD-10-CM | POA: Diagnosis present

## 2020-12-01 DIAGNOSIS — M810 Age-related osteoporosis without current pathological fracture: Secondary | ICD-10-CM | POA: Diagnosis present

## 2020-12-01 DIAGNOSIS — Z87442 Personal history of urinary calculi: Secondary | ICD-10-CM | POA: Diagnosis present

## 2020-12-01 DIAGNOSIS — Z780 Asymptomatic menopausal state: Secondary | ICD-10-CM | POA: Diagnosis present

## 2020-12-01 NOTE — H&P (Signed)
General Surgery [facility]  Dawn Russell DOB: 06/01/1966 Married / Language: English / Race: White Female   History of Present Illness  The patient is a 55 year old female who presents with primary hyperparathyroidism.  CHIEF COMPLAINT: primary hyperparathyroidism  Patient is referred by Dr. Philemon Kingdom for surgical evaluation and management of primary hyperparathyroidism.  Patient had been noted to have vitamin D deficiency by her primary care physician, Dr. Deborra Medina.  She was referred to endocrinology.  Evaluation demonstrated an elevated serum calcium level.  The highest level recorded was 10.8.  Patient was noted to have an elevated intact PTH level of 71 although her levels have ranged as low as 19.  A 24-hour urine collection for calcium was markedly elevated at 452.  Patient's most recent 25-hydroxy vitamin D level was normal at 37.0.  Patient has had complications of primary hyperparathyroidism including osteopenia and nephrolithiasis.  She has had mild fatigue.  She has had no prior head or neck surgery.  There is no family history of other endocrine neoplasm.  She presents today accompanied by her husband for further evaluation.  Nuclear medicine parathyroid scan was performed on September 17, 2020.  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  Patient has not had other imaging studies.   Past Surgical History Cesarean Section - 1   Colon Polyp Removal - Colonoscopy   Gallbladder Surgery - Laparoscopic    Diagnostic Studies History  Colonoscopy   within last year Mammogram   within last year  Allergies  Amoxicillin *PENICILLINS*   Latex Exam Gloves *MEDICAL DEVICES AND SUPPLIES*    Medication History traMADol HCl  (50MG  Tablet, Oral) Active. Baclofen  (10MG  Tablet, Oral) Active. Benzonatate  (100MG  Capsule, Oral) Active. Clobetasol Propionate  (0.05% Ointment, External) Active. Doxycycline Hyclate  (100MG  Tablet, Oral)  Active. Losartan Potassium  (50MG  Tablet, Oral) Active. Naproxen  (500MG  Tablet, Oral) Active. PEG 3350-KCl-Na Bicarb-NaCl  (420GM For Solution, Oral) Active. predniSONE  (10MG  Tablet, Oral) Active. Medications Reconciled   Social History Alcohol use   Occasional alcohol use. No drug use   Tobacco use   Never smoker.  Family History  Breast Cancer   Family Members In General. Cancer   Sister. Cerebrovascular Accident   Sister. Hypertension   Father, Mother. Migraine Headache   Mother. Thyroid problems   Mother.  Pregnancy / Birth History  Age at menarche   37 years. Gravida   2 Irregular periods   Maternal age   59-30 Para   2  Other Problems  Asthma   High blood pressure   Migraine Headache   Oophorectomy    Review of Systems  General Not Present- Appetite Loss, Chills, Fatigue, Fever, Night Sweats, Weight Gain and Weight Loss. Skin Not Present- Change in Wart/Mole, Dryness, Hives, Jaundice, New Lesions, Non-Healing Wounds, Rash and Ulcer. HEENT Present- Hearing Loss, Ringing in the Ears, Seasonal Allergies and Wears glasses/contact lenses. Not Present- Earache, Hoarseness, Nose Bleed, Oral Ulcers, Sinus Pain, Sore Throat, Visual Disturbances and Yellow Eyes. Respiratory Present- Snoring. Not Present- Bloody sputum, Chronic Cough, Difficulty Breathing and Wheezing. Breast Not Present- Breast Mass, Breast Pain, Nipple Discharge and Skin Changes. Cardiovascular Present- Leg Cramps. Not Present- Chest Pain, Difficulty Breathing Lying Down, Palpitations, Rapid Heart Rate, Shortness of Breath and Swelling of Extremities. Gastrointestinal Not Present- Abdominal Pain, Bloating, Bloody Stool, Change in Bowel Habits, Chronic diarrhea, Constipation, Difficulty Swallowing, Excessive gas, Gets full quickly at meals, Hemorrhoids, Indigestion, Nausea, Rectal Pain  and Vomiting. Female Genitourinary Present- Frequency and Urgency. Not Present- Nocturia, Painful Urination and Pelvic  Pain. Musculoskeletal Present- Muscle Pain. Not Present- Back Pain, Joint Pain, Joint Stiffness, Muscle Weakness and Swelling of Extremities. Neurological Not Present- Decreased Memory, Fainting, Headaches, Numbness, Seizures, Tingling, Tremor, Trouble walking and Weakness. Psychiatric Not Present- Anxiety, Bipolar, Change in Sleep Pattern, Depression, Fearful and Frequent crying. Endocrine Present- Hot flashes. Not Present- Cold Intolerance, Excessive Hunger, Hair Changes, Heat Intolerance and New Diabetes. Hematology Not Present- Blood Thinners, Easy Bruising, Excessive bleeding, Gland problems, HIV and Persistent Infections.  Vitals  Weight: 178.6 lb   Height: 67 in  Body Surface Area: 1.93 m   Body Mass Index: 27.97 kg/m   Temp.: 98 F    Pulse: 96 (Regular)    P.OX: 100% (Room air) BP: 118/76(Sitting, Left Arm, Standard)  Physical Exam   GENERAL APPEARANCE Development: normal Nutritional status: normal Gross deformities: none  SKIN Rash, lesions, ulcers: none Induration, erythema: none Nodules: none palpable  EYES Conjunctiva and lids: normal Pupils: equal and reactive Iris: normal bilaterally  EARS, NOSE, MOUTH, THROAT External ears: no lesion or deformity External nose: no lesion or deformity Hearing: grossly normal Due to Covid-19 pandemic, patient is wearing a mask.  NECK Symmetric: yes Trachea: midline Thyroid: no palpable nodules in the thyroid bed  CHEST Respiratory effort: normal Retraction or accessory muscle use: no Breath sounds: normal bilaterally Rales, rhonchi, wheeze: none  CARDIOVASCULAR Auscultation: regular rhythm, normal rate Murmurs: none Pulses: radial pulse 2+ palpable Lower extremity edema: none  MUSCULOSKELETAL Station and gait: normal Digits and nails: no clubbing or cyanosis Muscle strength: grossly normal all extremities Range of motion: grossly normal all extremities Deformity: none  LYMPHATIC Cervical: none  palpable Supraclavicular: none palpable  PSYCHIATRIC Oriented to person, place, and time: yes Mood and affect: normal for situation Judgment and insight: appropriate for situation    Assessment & Plan   PRIMARY HYPERPARATHYROIDISM (E21.0) OSTEOPENIA (M85.80) HISTORY OF NEPHROLITHIASIS (G25.427)  Patient is referred by her endocrinologist for surgical evaluation and management of primary hyperparathyroidism.   Patient provided with a copy of "Parathyroid Surgery: Treatment for Your Parathyroid Gland Problem", published by [employer], 12 pages.  Book reviewed and explained to patient during visit today.  Patient has biochemical evidence of primary hyperparathyroidism.  She has a nuclear medicine parathyroid scan which was localized a left inferior parathyroid adenoma.  She has developed complications including osteopenia and nephrolithiasis.  I would like to obtain an ultrasound examination of the neck in hopes of confirming the location of the parathyroid adenoma and to rule out concurrent thyroid disease.  Today we discussed proceeding with minimally invasive parathyroidectomy.  We discussed the size and location of the surgical incision.  We discussed potential complications including the potential for recurrent laryngeal nerve injury.  We discussed the hospital stay to be anticipated and her postoperative recovery and return to work and activities.  She understands and wishes to proceed.  The risks and benefits of the procedure have been discussed at length with the patient.  The patient understands the proposed procedure, potential alternative treatments, and the course of recovery to be expected.  All of the patient's questions have been answered at this time.  The patient wishes to proceed with surgery.  Armandina Gemma, MD [facility] Office: [phone]

## 2020-12-02 ENCOUNTER — Ambulatory Visit (HOSPITAL_COMMUNITY)
Admission: RE | Admit: 2020-12-02 | Discharge: 2020-12-02 | Disposition: A | Discharge status: Home / Self Care | Payer: BC Managed Care – PPO | Attending: Surgery | Admitting: Surgery

## 2020-12-02 ENCOUNTER — Encounter (HOSPITAL_COMMUNITY)
Admission: RE | Disposition: A | Discharge status: Home / Self Care | Payer: Self-pay | Source: Home / Self Care | Attending: Surgery

## 2020-12-02 ENCOUNTER — Encounter (HOSPITAL_COMMUNITY): Payer: Self-pay | Admitting: Surgery

## 2020-12-02 ENCOUNTER — Ambulatory Visit (HOSPITAL_COMMUNITY): Payer: BC Managed Care – PPO | Admitting: Anesthesiology

## 2020-12-02 DIAGNOSIS — Z880 Allergy status to penicillin: Secondary | ICD-10-CM | POA: Insufficient documentation

## 2020-12-02 DIAGNOSIS — Z91040 Latex allergy status: Secondary | ICD-10-CM | POA: Diagnosis not present

## 2020-12-02 DIAGNOSIS — M810 Age-related osteoporosis without current pathological fracture: Secondary | ICD-10-CM | POA: Diagnosis present

## 2020-12-02 DIAGNOSIS — D351 Benign neoplasm of parathyroid gland: Secondary | ICD-10-CM | POA: Diagnosis not present

## 2020-12-02 DIAGNOSIS — M545 Low back pain, unspecified: Secondary | ICD-10-CM | POA: Diagnosis not present

## 2020-12-02 DIAGNOSIS — Z86010 Personal history of colonic polyps: Secondary | ICD-10-CM | POA: Insufficient documentation

## 2020-12-02 DIAGNOSIS — E210 Primary hyperparathyroidism: Secondary | ICD-10-CM | POA: Diagnosis not present

## 2020-12-02 DIAGNOSIS — Z87442 Personal history of urinary calculi: Secondary | ICD-10-CM | POA: Diagnosis present

## 2020-12-02 DIAGNOSIS — Z780 Asymptomatic menopausal state: Secondary | ICD-10-CM | POA: Diagnosis present

## 2020-12-02 DIAGNOSIS — E559 Vitamin D deficiency, unspecified: Secondary | ICD-10-CM | POA: Diagnosis not present

## 2020-12-02 DIAGNOSIS — M8580 Other specified disorders of bone density and structure, unspecified site: Secondary | ICD-10-CM | POA: Diagnosis not present

## 2020-12-02 DIAGNOSIS — I10 Essential (primary) hypertension: Secondary | ICD-10-CM | POA: Diagnosis not present

## 2020-12-02 DIAGNOSIS — Z98891 History of uterine scar from previous surgery: Secondary | ICD-10-CM | POA: Diagnosis not present

## 2020-12-02 HISTORY — PX: PARATHYROIDECTOMY: SHX19

## 2020-12-02 LAB — HCG, SERUM, QUALITATIVE: Preg, Serum: NEGATIVE

## 2020-12-02 SURGERY — PARATHYROIDECTOMY
Anesthesia: General | Site: Neck | Laterality: Left

## 2020-12-02 MED ORDER — FENTANYL CITRATE (PF) 100 MCG/2ML IJ SOLN
INTRAMUSCULAR | Status: AC
  Filled 2020-12-02: qty 2

## 2020-12-02 MED ORDER — PHENYLEPHRINE HCL (PRESSORS) 10 MG/ML IV SOLN
INTRAVENOUS | Status: AC
  Filled 2020-12-02: qty 2

## 2020-12-02 MED ORDER — SUGAMMADEX SODIUM 200 MG/2ML IV SOLN
INTRAVENOUS | Status: DC | PRN
  Administered 2020-12-02: 200 mg via INTRAVENOUS

## 2020-12-02 MED ORDER — ROCURONIUM BROMIDE 10 MG/ML (PF) SYRINGE
PREFILLED_SYRINGE | INTRAVENOUS | Status: DC | PRN
  Administered 2020-12-02: 70 mg via INTRAVENOUS

## 2020-12-02 MED ORDER — LIDOCAINE 2% (20 MG/ML) 5 ML SYRINGE
INTRAMUSCULAR | Status: DC | PRN
  Administered 2020-12-02: 60 mg via INTRAVENOUS

## 2020-12-02 MED ORDER — LIDOCAINE 2% (20 MG/ML) 5 ML SYRINGE
INTRAMUSCULAR | Status: AC
  Filled 2020-12-02: qty 5

## 2020-12-02 MED ORDER — PHENYLEPHRINE 40 MCG/ML (10ML) SYRINGE FOR IV PUSH (FOR BLOOD PRESSURE SUPPORT)
PREFILLED_SYRINGE | INTRAVENOUS | Status: DC | PRN
  Administered 2020-12-02 (×3): 80 ug via INTRAVENOUS
  Administered 2020-12-02: 160 ug via INTRAVENOUS

## 2020-12-02 MED ORDER — SCOPOLAMINE 1 MG/3DAYS TD PT72
1.0000 | MEDICATED_PATCH | TRANSDERMAL | Status: DC
  Administered 2020-12-02: 1.5 mg via TRANSDERMAL
  Filled 2020-12-02: qty 1

## 2020-12-02 MED ORDER — LACTATED RINGERS IV SOLN: INTRAVENOUS | Status: DC

## 2020-12-02 MED ORDER — BUPIVACAINE HCL 0.25 % IJ SOLN
INTRAMUSCULAR | Status: AC
  Filled 2020-12-02: qty 1

## 2020-12-02 MED ORDER — DEXAMETHASONE SODIUM PHOSPHATE 10 MG/ML IJ SOLN
INTRAMUSCULAR | Status: AC
  Filled 2020-12-02: qty 1

## 2020-12-02 MED ORDER — FENTANYL CITRATE (PF) 100 MCG/2ML IJ SOLN
INTRAMUSCULAR | Status: DC | PRN
  Administered 2020-12-02 (×2): 50 ug via INTRAVENOUS

## 2020-12-02 MED ORDER — ROCURONIUM BROMIDE 10 MG/ML (PF) SYRINGE
PREFILLED_SYRINGE | INTRAVENOUS | Status: AC
  Filled 2020-12-02: qty 10

## 2020-12-02 MED ORDER — CHLORHEXIDINE GLUCONATE CLOTH 2 % EX PADS: 6.0000 | MEDICATED_PAD | Freq: Once | CUTANEOUS | Status: DC

## 2020-12-02 MED ORDER — AMISULPRIDE (ANTIEMETIC) 5 MG/2ML IV SOLN: 10.0000 mg | Freq: Once | INTRAVENOUS | Status: DC | PRN

## 2020-12-02 MED ORDER — MEPERIDINE HCL 50 MG/ML IJ SOLN: 6.2500 mg | INTRAMUSCULAR | Status: DC | PRN

## 2020-12-02 MED ORDER — DEXAMETHASONE SODIUM PHOSPHATE 10 MG/ML IJ SOLN
INTRAMUSCULAR | Status: DC | PRN
  Administered 2020-12-02: 10 mg via INTRAVENOUS

## 2020-12-02 MED ORDER — TRAMADOL HCL 50 MG PO TABS: 50.0000 mg | ORAL_TABLET | Freq: Four times a day (QID) | ORAL | 0 refills | Status: DC | PRN

## 2020-12-02 MED ORDER — PROMETHAZINE HCL 25 MG/ML IJ SOLN: 6.2500 mg | INTRAMUSCULAR | Status: DC | PRN

## 2020-12-02 MED ORDER — ONDANSETRON HCL 4 MG/2ML IJ SOLN
INTRAMUSCULAR | Status: DC | PRN
  Administered 2020-12-02: 4 mg via INTRAVENOUS

## 2020-12-02 MED ORDER — OXYCODONE HCL 5 MG PO TABS
ORAL_TABLET | ORAL | Status: AC
  Filled 2020-12-02: qty 1

## 2020-12-02 MED ORDER — PROPOFOL 10 MG/ML IV BOLUS
INTRAVENOUS | Status: DC | PRN
  Administered 2020-12-02: 200 mg via INTRAVENOUS

## 2020-12-02 MED ORDER — PHENYLEPHRINE 40 MCG/ML (10ML) SYRINGE FOR IV PUSH (FOR BLOOD PRESSURE SUPPORT)
PREFILLED_SYRINGE | INTRAVENOUS | Status: AC
  Filled 2020-12-02: qty 10

## 2020-12-02 MED ORDER — HYDROMORPHONE HCL 1 MG/ML IJ SOLN: 0.2500 mg | INTRAMUSCULAR | Status: DC | PRN

## 2020-12-02 MED ORDER — CEFAZOLIN SODIUM-DEXTROSE 2-4 GM/100ML-% IV SOLN
2.0000 g | INTRAVENOUS | Status: AC
  Administered 2020-12-02: 2 g via INTRAVENOUS
  Filled 2020-12-02: qty 100

## 2020-12-02 MED ORDER — BUPIVACAINE HCL 0.25 % IJ SOLN
INTRAMUSCULAR | Status: DC | PRN
  Administered 2020-12-02: 10 mL

## 2020-12-02 MED ORDER — OXYCODONE HCL 5 MG/5ML PO SOLN: 5.0000 mg | Freq: Once | ORAL | Status: AC | PRN

## 2020-12-02 MED ORDER — ORAL CARE MOUTH RINSE: 15.0000 mL | Freq: Once | OROMUCOSAL | Status: AC

## 2020-12-02 MED ORDER — CHLORHEXIDINE GLUCONATE 0.12 % MT SOLN
15.0000 mL | Freq: Once | OROMUCOSAL | Status: AC
  Administered 2020-12-02: 15 mL via OROMUCOSAL

## 2020-12-02 MED ORDER — MIDAZOLAM HCL 2 MG/2ML IJ SOLN
INTRAMUSCULAR | Status: AC
  Filled 2020-12-02: qty 2

## 2020-12-02 MED ORDER — PROPOFOL 10 MG/ML IV BOLUS
INTRAVENOUS | Status: AC
  Filled 2020-12-02: qty 20

## 2020-12-02 MED ORDER — HEMOSTATIC AGENTS (NO CHARGE) OPTIME
TOPICAL | Status: DC | PRN
  Administered 2020-12-02: 1 via TOPICAL

## 2020-12-02 MED ORDER — ACETAMINOPHEN 500 MG PO TABS
1000.0000 mg | ORAL_TABLET | Freq: Once | ORAL | Status: AC
  Administered 2020-12-02: 1000 mg via ORAL
  Filled 2020-12-02: qty 2

## 2020-12-02 MED ORDER — MIDAZOLAM HCL 5 MG/5ML IJ SOLN
INTRAMUSCULAR | Status: DC | PRN
  Administered 2020-12-02: 2 mg via INTRAVENOUS

## 2020-12-02 MED ORDER — 0.9 % SODIUM CHLORIDE (POUR BTL) OPTIME
TOPICAL | Status: DC | PRN
  Administered 2020-12-02: 1000 mL

## 2020-12-02 MED ORDER — ONDANSETRON HCL 4 MG/2ML IJ SOLN
INTRAMUSCULAR | Status: AC
  Filled 2020-12-02: qty 2

## 2020-12-02 MED ORDER — OXYCODONE HCL 5 MG PO TABS
5.0000 mg | ORAL_TABLET | Freq: Once | ORAL | Status: AC | PRN
Start: 2020-12-02 — End: 2020-12-02
  Administered 2020-12-02: 5 mg via ORAL

## 2020-12-02 SURGICAL SUPPLY — 35 items
ADH SKN CLS APL DERMABOND .7 (GAUZE/BANDAGES/DRESSINGS) ×1
APL PRP STRL LF DISP 70% ISPRP (MISCELLANEOUS) ×1
ATTRACTOMAT 16X20 MAGNETIC DRP (DRAPES) ×2 IMPLANT
BLADE SURG 15 STRL LF DISP TIS (BLADE) ×1 IMPLANT
BLADE SURG 15 STRL SS (BLADE) ×2
CHLORAPREP W/TINT 26 (MISCELLANEOUS) ×2 IMPLANT
CLIP VESOCCLUDE MED 6/CT (CLIP) ×4 IMPLANT
CLIP VESOCCLUDE SM WIDE 6/CT (CLIP) ×4 IMPLANT
COVER SURGICAL LIGHT HANDLE (MISCELLANEOUS) ×2 IMPLANT
COVER WAND RF STERILE (DRAPES) ×1 IMPLANT
DERMABOND ADVANCED (GAUZE/BANDAGES/DRESSINGS) ×1
DERMABOND ADVANCED .7 DNX12 (GAUZE/BANDAGES/DRESSINGS) ×1 IMPLANT
DRAPE LAPAROTOMY T 98X78 PEDS (DRAPES) ×2 IMPLANT
DRAPE UTILITY XL STRL (DRAPES) ×2 IMPLANT
ELECT PENCIL ROCKER SW 15FT (MISCELLANEOUS) ×1 IMPLANT
ELECT REM PT RETURN 15FT ADLT (MISCELLANEOUS) ×2 IMPLANT
GAUZE 4X4 16PLY RFD (DISPOSABLE) ×2 IMPLANT
GLOVE SRG 8 PF TXTR STRL LF DI (GLOVE) IMPLANT
GLOVE SURG UNDER POLY LF SZ8 (GLOVE) ×2
GOWN STRL REUS W/TWL XL LVL3 (GOWN DISPOSABLE) ×6 IMPLANT
HEMOSTAT SURGICEL 2X4 FIBR (HEMOSTASIS) ×2 IMPLANT
ILLUMINATOR WAVEGUIDE N/F (MISCELLANEOUS) IMPLANT
KIT BASIN OR (CUSTOM PROCEDURE TRAY) ×2 IMPLANT
KIT TURNOVER KIT A (KITS) ×2 IMPLANT
NDL HYPO 25X1 1.5 SAFETY (NEEDLE) ×1 IMPLANT
NEEDLE HYPO 25X1 1.5 SAFETY (NEEDLE) ×2 IMPLANT
PACK BASIC VI WITH GOWN DISP (CUSTOM PROCEDURE TRAY) ×2 IMPLANT
PENCIL SMOKE EVACUATOR (MISCELLANEOUS) ×1 IMPLANT
SUT MNCRL AB 4-0 PS2 18 (SUTURE) ×2 IMPLANT
SUT VIC AB 3-0 SH 18 (SUTURE) ×2 IMPLANT
SYR BULB IRRIG 60ML STRL (SYRINGE) ×2 IMPLANT
SYR CONTROL 10ML LL (SYRINGE) ×2 IMPLANT
TOWEL OR 17X26 10 PK STRL BLUE (TOWEL DISPOSABLE) ×2 IMPLANT
TOWEL OR NON WOVEN STRL DISP B (DISPOSABLE) ×2 IMPLANT
TUBING CONNECTING 10 (TUBING) ×2 IMPLANT

## 2020-12-02 NOTE — Transfer of Care (Signed)
Immediate Anesthesia Transfer of Care Note  Patient: GREER WAINRIGHT  Procedure(s) Performed: LEFT INFERIOR PARATHYROIDECTOMY (Left: Neck)  Patient Location: PACU  Anesthesia Type:General  Level of Consciousness: awake, alert  and oriented  Airway & Oxygen Therapy: Patient Spontanous Breathing and Patient connected to face mask oxygen  Post-op Assessment: Report given to RN and Post -op Vital signs reviewed and stable  Post vital signs: Reviewed and stable  Last Vitals:  Vitals Value Taken Time  BP 140/83 12/02/20 0825  Temp    Pulse 93 12/02/20 0828  Resp    SpO2 100 % 12/02/20 0828  Vitals shown include unvalidated device data.  Last Pain:  Vitals:   12/02/20 0604  TempSrc:   PainSc: 0-No pain         Complications: No notable events documented.

## 2020-12-02 NOTE — Interval H&P Note (Signed)
History and Physical Interval Note:  12/02/2020 6:52 AM  Dawn Russell  has presented today for surgery, with the diagnosis of PRIMARY HYPERPARATHYROIDISM.  The various methods of treatment have been discussed with the patient and family. After consideration of risks, benefits and other options for treatment, the patient has consented to    Procedure(s): LEFT INFERIOR PARATHYROIDECTOMY (Left) as a surgical intervention.    The patient's history has been reviewed, patient examined, no change in status, stable for surgery.  I have reviewed the patient's chart and labs.  Questions were answered to the patient's satisfaction.    Armandina Gemma, MD [facility] Office: [place]

## 2020-12-02 NOTE — Anesthesia Procedure Notes (Addendum)
Procedure Name: Intubation Date/Time: 12/02/2020 7:28 AM Performed by: Victoriano Lain, CRNA Pre-anesthesia Checklist: Patient identified, Emergency Drugs available, Suction available, Timeout performed and Patient being monitored Patient Re-evaluated:Patient Re-evaluated prior to induction Oxygen Delivery Method: Circle system utilized Preoxygenation: Pre-oxygenation with 100% oxygen Induction Type: IV induction Ventilation: Oral airway inserted - appropriate to patient size and Mask ventilation without difficulty Laryngoscope Size: Mac and 3 Grade View: Grade I Tube size: 7.5 mm Number of attempts: 1 Airway Equipment and Method: Stylet Placement Confirmation: ETT inserted through vocal cords under direct vision, positive ETCO2 and breath sounds checked- equal and bilateral Secured at: 23 cm Tube secured with: Tape Dental Injury: Teeth and Oropharynx as per pre-operative assessment  Comments: DL x1 Charyl Bigger, SRNA

## 2020-12-02 NOTE — Op Note (Addendum)
OPERATIVE REPORT - PARATHYROIDECTOMY  Preoperative diagnosis: Primary hyperparathyroidism  Postop diagnosis: Same  Procedure: Left inferior minimally invasive parathyroidectomy  Surgeon:  Armandina Gemma, MD  Assistant:  Carlena Hurl, PA-C  Anesthesia: General endotracheal  Estimated blood loss: Minimal  Preparation: ChloraPrep  Indications: Patient is referred by Dr. Philemon Kingdom for surgical evaluation and management of primary hyperparathyroidism.  Patient had been noted to have vitamin D deficiency by her primary care physician, Dr. Deborra Medina.  She was referred to endocrinology.  Evaluation demonstrated an elevated serum calcium level.  The highest level recorded was 10.8.  Patient was noted to have an elevated intact PTH level of 71 although her levels have ranged as low as 19.  A 24-hour urine collection for calcium was markedly elevated at 452.  Patient's most recent 25-hydroxy vitamin D level was normal at 37.0.  Patient has had complications of primary hyperparathyroidism including osteopenia and nephrolithiasis.  She has had mild fatigue.  She has had no prior head or neck surgery.  There is no family history of other endocrine neoplasm.  She presents today accompanied by her husband for further evaluation.  Nuclear medicine parathyroid scan was performed on September 17, 2020.  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  Procedure: The patient was prepared in the pre-operative holding area. The patient was brought to the operating room and placed in a supine position on the operating room table. Following administration of general anesthesia, the patient was positioned and then prepped and draped in the usual strict aseptic fashion. After ascertaining that an adequate level of anesthesia been achieved, a neck incision was made with a #15 blade. Dissection was carried through subcutaneous tissues and platysma. Hemostasis was obtained with the electrocautery. Skin  flaps were developed circumferentially and a Weitlander retractor was placed for exposure.  Strap muscles were incised in the midline. Strap muscles were reflected laterally exposing the thyroid lobe. With gentle blunt dissection the thyroid lobe was mobilized.  Dissection was carried through adipose tissue and an enlarged parathyroid gland was identified. It was gently mobilized. Vascular structures were divided between small ligaclips. Care was taken to avoid the recurrent laryngeal nerve and the esophagus. The parathyroid gland was completely excised. It was submitted to pathology where frozen section confirmed hypercellular parathyroid tissue consistent with adenoma.  Neck was irrigated with warm saline and good hemostasis was noted. Fibrillar was placed in the operative field. Strap muscles were approximated in the midline with interrupted 3-0 Vicryl sutures. Platysma was closed with interrupted 3-0 Vicryl sutures. Marcaine was infiltrated circumferentially. Skin was closed with a running 4-0 Monocryl subcuticular suture. Wound was washed and dried and Dermabond was applied. Patient was awakened from anesthesia and brought to the recovery room. The patient tolerated the procedure well.   Armandina Gemma, MD [facility] Office: [phone]

## 2020-12-02 NOTE — Anesthesia Postprocedure Evaluation (Signed)
Anesthesia Post Note  Patient: Dawn Russell  Procedure(s) Performed: LEFT INFERIOR PARATHYROIDECTOMY (Left: Neck)     Patient location during evaluation: PACU Anesthesia Type: General Level of consciousness: awake and alert, oriented and patient cooperative Pain management: pain level controlled Vital Signs Assessment: post-procedure vital signs reviewed and stable Respiratory status: spontaneous breathing, nonlabored ventilation and respiratory function stable Cardiovascular status: blood pressure returned to baseline and stable Postop Assessment: no apparent nausea or vomiting Anesthetic complications: no   No notable events documented.  Last Vitals:  Vitals:   12/02/20 0825 12/02/20 0830  BP: 140/83 134/74  Pulse: 92 88  Resp: 12   Temp: 36.7 C   SpO2: 100% 100%    Last Pain:  Vitals:   12/02/20 0825  TempSrc:   PainSc: 0-No pain                 Pervis Hocking

## 2020-12-03 ENCOUNTER — Encounter (HOSPITAL_COMMUNITY): Payer: Self-pay | Admitting: Surgery

## 2020-12-03 LAB — SURGICAL PATHOLOGY

## 2021-01-20 DIAGNOSIS — E559 Vitamin D deficiency, unspecified: Secondary | ICD-10-CM | POA: Diagnosis not present

## 2021-01-20 DIAGNOSIS — E892 Postprocedural hypoparathyroidism: Secondary | ICD-10-CM | POA: Diagnosis not present

## 2021-02-12 DIAGNOSIS — Z01419 Encounter for gynecological examination (general) (routine) without abnormal findings: Secondary | ICD-10-CM | POA: Diagnosis not present

## 2021-02-12 DIAGNOSIS — Z8543 Personal history of malignant neoplasm of ovary: Secondary | ICD-10-CM | POA: Diagnosis not present

## 2021-02-12 DIAGNOSIS — Z1231 Encounter for screening mammogram for malignant neoplasm of breast: Secondary | ICD-10-CM | POA: Diagnosis not present

## 2021-02-12 DIAGNOSIS — Z6827 Body mass index (BMI) 27.0-27.9, adult: Secondary | ICD-10-CM | POA: Diagnosis not present

## 2021-02-12 DIAGNOSIS — Z124 Encounter for screening for malignant neoplasm of cervix: Secondary | ICD-10-CM | POA: Diagnosis not present

## 2021-04-08 ENCOUNTER — Ambulatory Visit (INDEPENDENT_AMBULATORY_CARE_PROVIDER_SITE_OTHER): Payer: BC Managed Care – PPO | Admitting: Internal Medicine

## 2021-04-08 ENCOUNTER — Encounter: Payer: Self-pay | Admitting: Internal Medicine

## 2021-04-08 ENCOUNTER — Other Ambulatory Visit: Payer: Self-pay

## 2021-04-08 VITALS — BP 126/78 | HR 88 | Ht 67.0 in | Wt 175.6 lb

## 2021-04-08 DIAGNOSIS — E210 Primary hyperparathyroidism: Secondary | ICD-10-CM | POA: Diagnosis not present

## 2021-04-08 DIAGNOSIS — E559 Vitamin D deficiency, unspecified: Secondary | ICD-10-CM

## 2021-04-08 NOTE — Progress Notes (Signed)
Patient ID: Dawn Russell, female   DOB: 10/15/1965, 55 y.o.   MRN: [id number]   This visit occurred during the SARS-CoV-2 public health emergency.  Safety protocols were in place, including screening questions prior to the visit, additional usage of staff PPE, and extensive cleaning of exam room while observing appropriate contact time as indicated for disinfecting solutions.   HPI  Dawn Russell is a 55 y.o.-year-old female, initially referred by her PCP, Dr. Derrel Nip, returning for follow-up for hypercalcemia/hyperparathyroidism.  Last visit 6 months ago.   Interim history: Since last visit, she had parathyroid surgery.   She feels well after the surgery, without any complaints. Approximately 2 weeks after the surgery, she had a low back pain and hematuria and thinks it is possible that she may have passed a small stone.   Reviewed and addended history: She had her first kidney stone in 2013 (had to have lithotripsy) and had another kidney stone recently (passed it and was analyzed by urology).  At that time, she was told that the stone was made of calcium and she also had a 24-hour urine collection showing a high urinary calcium. She is seeing Dr. Diona Fanti.  She was started on indapamide before our last visit  - to reduce kidney stone incidence.  A 24-hour urine collection checked after she started on indapamide showed an improved calcium level.   To be able to further investigated her parathyroid status, we stopped indapamide.  Repeat calcium and PTH were normal.  No new kidney stones, however, she tells me that she does have 1 in the left kidney, unchanged. She changed her diet: No red meat, decreased salt.  She had an elevated calcium in 06/2018 and then again in 04/21.  PTH level was not suppressed initially, but at last check, it was appropriate for calcium in the upper limit of normal.  We checked a Tc sestamibi parathyroid scan (09/17/2020) and this showed a left inferior parathyroid  adenoma.   I referred her to surgery and Dr. Harlow Asa checked a thyroid ultrasound (11/28/2020): Normal thyroid, 2 nodules inferior to right thyroid (0.6 x 0.6 x 0.4 cm) and inferior to left thyroid (0.7 x 0.7 x 0.4 cm), most compatible with parathyroid glands.  She had left inferior parathyroidectomy by Dr. Harlow Asa 12/02/2020: 0.7 x 0.4 x 0.4 cm, 0.085 g parathyroid adenoma resected.  Calcium and PTH after surgery (01/2021): 9.6 and 46, respectively.  Reviewed pertinent labs: Lab Results  Component Value Date   PTH 27 10/03/2020   PTH 19 03/29/2020   PTH Comment 03/29/2020   PTH 36 09/15/2019   PTH Comment 09/15/2019   PTH 64 03/22/2019   PTH 29 01/13/2019   PTH 71 (H) 09/13/2018   CALCIUM 9.5 11/26/2020   CALCIUM 10.3 10/03/2020   CALCIUM 10.1 03/29/2020   CALCIUM 10.5 (H) 09/15/2019   CALCIUM 9.7 03/22/2019   CALCIUM 10.0 01/13/2019   CALCIUM 9.8 09/13/2018   CALCIUM 9.9 09/13/2018   CALCIUM 10.8 (H) 07/01/2018   CALCIUM 9.8 11/26/2017   Component     Latest Ref Rng & Units 09/13/2018  Calcium Ionized     4.8 - 5.6 mg/dL 5.33   11/30/2018: Reviewed records from Dr. Diona Fanti: Patient had elevated urine calcium in 24-hour urine collection (08/04/2018) at 452.  After starting HCTZ, it  decreased to 234 (11/02/2018)  Her calcium, phosphorus, magnesium, were normal, while her calcitriol level was slightly elevated: Component     Latest Ref Rng & Units 03/22/2019  Calcium  8.6 - 10.4 mg/dL 9.7  Phosphorus     2.5 - 4.5 mg/dL 3.0  Magnesium     1.5 - 2.5 mg/dL 2.0  PTH, Intact     14 - 64 pg/mL 64   Vitamin D 1, 25 (OH) Total     18 - 72 pg/mL 83 (H)  Vitamin D3 1, 25 (OH)     pg/mL 83  Vitamin D2 1, 25 (OH)     pg/mL <8   No history of osteoporosis, fractures, falls.   We obtained a DXA scan: This showed mild osteopenia:  09/03/2020 -  Lumbar spine L1-L4 Femoral neck (FN) 33% distal radius  T-score -0.4 RFN: -1.0 LFN: -1.1 -0.8   She has a history of  kidney stones x2: 05/2018: Joaquim Lai analysis: Carbonate apatite (calcium phosphate) (90%) calcium oxalate (10%) 09/08/2017: CT abdomen showing nonobstructive left nephrolithiasis.  She is drinking lemonade from [place] when she suspects she has a kidney stone and this helps.  No CKD. Last BUN/Cr: Lab Results  Component Value Date   BUN 11 11/26/2020   BUN 13 10/03/2020   CREATININE 0.63 11/26/2020   CREATININE 0.76 10/03/2020   She is not on HCTZ or indapamide now.  She is trying to follow a low-sodium diet.  She relaxed her diet in the last few months due to being very busy.  She has a history of vitamin D deficiency. Previously on ergocalciferol, then on oral vitamin D supplement, increased from 2000 to 4000 units daily - she now continues on this dose.  Reviewed previous vitamin D levels: Lab Results  Component Value Date   VD25OH 37.0 10/03/2020   VD25OH 25.88 (L) 03/29/2020   VD25OH 25.74 (L) 09/15/2019   VD25OH 32.90 11/15/2018   VD25OH 21.06 (L) 11/26/2016   VD25OH 12.72 (L) 06/15/2016   Pt does not have a FH of hypercalcemia, pituitary tumors, thyroid cancer, or osteoporosis.  Mother had partial thyroidectomy (? Dx).  She lost 30 lbs in 30-Aug-2016 - [employer].  In the past, we discussed about a plant-based diet and I gave her references.    1 of her sisters developed a brain tumor at 81 years old and died from it at 56 years old. Another sister died of lung cancer in 08-31-19.   ROS: + see HPI  I reviewed pt's medications, allergies, PMH, social hx, family hx, and changes were documented in the history of present illness. Otherwise, unchanged from my initial visit note.  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   Pelvic pain in female    Vaginitis    Past Surgical History:  Procedure Laterality Date   CESAREAN SECTION     CHOLECYSTECTOMY     COLONOSCOPY WITH PROPOFOL N/A 10/11/2015   Procedure: COLONOSCOPY WITH PROPOFOL;  Surgeon: Lucilla Lame, MD;  Location: [facility];  Service: Endoscopy;  Laterality: N/A;  latex sensitivity   COLONOSCOPY WITH PROPOFOL N/A 10/11/2020   Procedure: COLONOSCOPY WITH BIOPSY;  Surgeon: Lucilla Lame, MD;  Location: [facility];  Service: Endoscopy;  Laterality: N/A;  Latex priority 4   OOPHORECTOMY Right 2004   due to large benign cyst,  elevated CA125   PARATHYROIDECTOMY Left 12/02/2020   Procedure: LEFT INFERIOR PARATHYROIDECTOMY;  Surgeon: Armandina Gemma, MD;  Location: [facility];  Service: General;  Laterality: Left;   POLYPECTOMY  10/11/2015   Procedure: POLYPECTOMY;  Surgeon: Lucilla Lame, MD;  Location: [facility];  Service: Endoscopy;;   POLYPECTOMY N/A 10/11/2020   Procedure: POLYPECTOMY;  Surgeon: Lucilla Lame, MD;  Location: [facility];  Service: Endoscopy;  Laterality: N/A;   Social History   Socioeconomic History   Marital status: Married    Spouse name: Not on file   Number of children: 2   Years of education: Not on file   Highest education level: Not on file  Occupational History    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  Lifestyle   Physical activity:    Days per week: Not on file    Minutes per session:  Not on file   Stress: Not on file  Relationships   Social connections:    Talks on phone: Not on file    Gets together: Not on file    Attends religious service: Not on file    Active member of club or organization: Not on file    Attends meetings of clubs or organizations: Not on file    Relationship status: Not on file   Intimate partner violence:    Fear of current or ex partner: Not on file    Emotionally abused: Not on file    Physically abused: Not on file    Forced sexual activity: Not on file  Other Topics Concern   Not on file  Social History Narrative   Married    Current Outpatient Medications on File Prior to Visit  Medication Sig Dispense Refill   albuterol (VENTOLIN HFA) 108 (90 Base) MCG/ACT inhaler INHALE TWO PUFFS 4 TIMES DAILY AS NEEDED FOR  WHEEZING (Patient taking differently: Inhale 2 puffs into the lungs 4 (four) times daily as needed for wheezing.) 18 g 3   cholecalciferol (VITAMIN D) 25 MCG (1000 UNIT) tablet Take 4,000 Units by mouth daily.     loratadine (CLARITIN) 10 MG tablet Take 10 mg by mouth daily.     losartan (COZAAR) 50 MG tablet TAKE 1 TABLET DAILY (Patient taking differently: Take 50 mg by mouth daily.) 90 tablet 3   sodium chloride (OCEAN) 0.65 % SOLN nasal spray Place 1 spray into both nostrils as needed for congestion.     traMADol (ULTRAM) 50 MG tablet Take 1-2 tablets (50-100 mg total) by mouth every 6 (six) hours as needed for moderate pain. 15 tablet 0   No current facility-administered medications on file prior to visit.   Allergies  Allergen Reactions   Amoxicillin Rash   Latex Rash    Burning after internal ultrasound   Monistat [Miconazole] Rash    Itching and burning   Septra [  Bactrim] Rash   Sulfa Antibiotics Rash   Sulfamethoxazole-Trimethoprim Rash   Family History  Problem Relation Age of Onset   Hypertension Father    Stroke Father    Hypertension Mother    Hyperlipidemia Mother    Cancer Sister        brain tumor     PE: BP 126/78 (BP Location: Right Arm, Patient Position: Sitting, Cuff Size: Normal)   Pulse 88   Ht 5\' 7"  (1.702 m)   Wt 175 lb 9.6 oz (79.7 kg)   SpO2 98%   BMI 27.50 kg/m  Wt Readings from Last 3 Encounters:  04/08/21 175 lb 9.6 oz (79.7 kg)  12/02/20 173 lb 1 oz (78.5 kg)  11/26/20 173 lb (78.5 kg)   Constitutional: + slightly overweight, in NAD Eyes: PERRLA, EOMI, no exophthalmos ENT: moist mucous membranes, no thyromegaly, cervical scar healed, no cervical lymphadenopathy Cardiovascular: RRR, No MRG Respiratory: CTA B Gastrointestinal: abdomen soft, NT, ND, BS+ Musculoskeletal: no deformities, strength intact in all 4 Skin: moist, warm, no rashes Neurological: no tremor with outstretched hands, DTR normal in all 4  Assessment: 1. Hypercalcemia/hyperparathyroidism  2.  Vitamin D deficiency  Plan: Patient with history of elevated calcium with the highest level being 10.8.  Intact PTH was slightly high, also, at 71, corresponding to a calcium in the normal range, at 9.8.  She was also found to have an elevated 24-hour urine calcium in the urologist office and she was started on indapamide.  To further investigate her hyperparathyroidism, we stopped indapamide in 11/2018.  At further visits, PTH was lower, at 19, for a normal calcium level of 10.1.  Vitamin D level was still slightly low at that time and we increased the supplement dose, after which her vitamin D level normalized.  Phosphorus, magnesium, and calcitriol levels were normal.  Her DXA scan showed osteopenia.  She does have a history of kidney stones x2 and her 24-hour urine calcium was elevated, >400 mg/d. -Before last visit, she had a technetium sestamibi scan (09/17/2020) and this showed a left inferior parathyroid adenoma -I referred her to surgery and she had a neck ultrasound (11/28/2020) showing no thyroid nodules but 2 visible parathyroid glands: Right inferior and left inferior -She then had left inferior  parathyroidectomy on 12/02/2020.  Calcium and PTH level normalized postop: 9.6 and 46 respectively. -Final pathology showed a nodule weighing 0.0 85 g and measuring 0.7 x 0.4 x 0.4 cm, compatible with an adenoma. -At this visit, she mentions that she feels good after the surgery -the right corner of her scar has a little induration-we discussed about possibly using Kelo-cote on it -At today's visit we will check her calcium, PTH, and vitamin D - I will see the patient back in 1 year  2.  Vitamin D deficiency -History of low vitamin D level -She continues on vitamin D 4000 units daily -Latest vitamin D level was normal, at last visit: Lab Results  Component Value Date   VD25OH 37.0 10/03/2020  -We will recheck this today  Component     Latest Ref Rng & Units 04/08/2021          Calcium     8.7 - 10.2 mg/dL 9.9  PTH, Intact     15 - 65 pg/mL 34  PTH Interp      Comment  Vitamin D, 25-Hydroxy     30.0 - 100.0 ng/mL 53.7  All labs are normal.  Philemon Kingdom, MD PhD [facility] Endocrinology

## 2021-04-08 NOTE — Patient Instructions (Signed)
Please stop at the lab.  Please continue vitamin D 4000 units daily.  Please come back for a follow-up appointment in 1 year.

## 2021-04-09 LAB — PTH, INTACT AND CALCIUM
Calcium: 9.9 mg/dL (ref 8.7–10.2)
PTH: 34 pg/mL (ref 15–65)

## 2021-04-09 LAB — VITAMIN D 25 HYDROXY (VIT D DEFICIENCY, FRACTURES): Vit D, 25-Hydroxy: 53.7 ng/mL (ref 30.0–100.0)

## 2021-04-14 ENCOUNTER — Emergency Department (HOSPITAL_COMMUNITY)
Admission: EM | Admit: 2021-04-14 | Discharge: 2021-04-15 | Disposition: A | Discharge status: Home / Self Care | Payer: BC Managed Care – PPO | Attending: Student | Admitting: Student

## 2021-04-14 ENCOUNTER — Emergency Department (HOSPITAL_COMMUNITY): Payer: BC Managed Care – PPO

## 2021-04-14 ENCOUNTER — Other Ambulatory Visit: Payer: Self-pay

## 2021-04-14 ENCOUNTER — Encounter (HOSPITAL_COMMUNITY): Payer: Self-pay | Admitting: Emergency Medicine

## 2021-04-14 DIAGNOSIS — N132 Hydronephrosis with renal and ureteral calculous obstruction: Secondary | ICD-10-CM | POA: Diagnosis not present

## 2021-04-14 DIAGNOSIS — K3189 Other diseases of stomach and duodenum: Secondary | ICD-10-CM | POA: Diagnosis not present

## 2021-04-14 DIAGNOSIS — R1032 Left lower quadrant pain: Secondary | ICD-10-CM | POA: Diagnosis not present

## 2021-04-14 DIAGNOSIS — N200 Calculus of kidney: Secondary | ICD-10-CM | POA: Diagnosis not present

## 2021-04-14 DIAGNOSIS — N281 Cyst of kidney, acquired: Secondary | ICD-10-CM | POA: Diagnosis not present

## 2021-04-14 DIAGNOSIS — R109 Unspecified abdominal pain: Secondary | ICD-10-CM | POA: Insufficient documentation

## 2021-04-14 DIAGNOSIS — Z5321 Procedure and treatment not carried out due to patient leaving prior to being seen by health care provider: Secondary | ICD-10-CM | POA: Insufficient documentation

## 2021-04-14 DIAGNOSIS — R112 Nausea with vomiting, unspecified: Secondary | ICD-10-CM | POA: Insufficient documentation

## 2021-04-14 DIAGNOSIS — R6883 Chills (without fever): Secondary | ICD-10-CM | POA: Insufficient documentation

## 2021-04-14 DIAGNOSIS — N83202 Unspecified ovarian cyst, left side: Secondary | ICD-10-CM | POA: Diagnosis not present

## 2021-04-14 LAB — CBC WITH DIFFERENTIAL/PLATELET
Abs Immature Granulocytes: 0.07 10*3/uL (ref 0.00–0.07)
Basophils Absolute: 0 10*3/uL (ref 0.0–0.1)
Basophils Relative: 0 %
Eosinophils Absolute: 0 10*3/uL (ref 0.0–0.5)
Eosinophils Relative: 0 %
HCT: 42.3 % (ref 36.0–46.0)
Hemoglobin: 13.8 g/dL (ref 12.0–15.0)
Immature Granulocytes: 1 %
Lymphocytes Relative: 10 %
Lymphs Abs: 1.2 10*3/uL (ref 0.7–4.0)
MCH: 26.6 pg (ref 26.0–34.0)
MCHC: 32.6 g/dL (ref 30.0–36.0)
MCV: 81.7 fL (ref 80.0–100.0)
Monocytes Absolute: 0.2 10*3/uL (ref 0.1–1.0)
Monocytes Relative: 2 %
Neutro Abs: 10.8 10*3/uL — ABNORMAL HIGH (ref 1.7–7.7)
Neutrophils Relative %: 87 %
Platelets: 301 10*3/uL (ref 150–400)
RBC: 5.18 MIL/uL — ABNORMAL HIGH (ref 3.87–5.11)
RDW: 13.1 % (ref 11.5–15.5)
WBC: 12.3 10*3/uL — ABNORMAL HIGH (ref 4.0–10.5)
nRBC: 0 % (ref 0.0–0.2)

## 2021-04-14 LAB — COMPREHENSIVE METABOLIC PANEL
ALT: 43 U/L (ref 0–44)
AST: 27 U/L (ref 15–41)
Albumin: 4.7 g/dL (ref 3.5–5.0)
Alkaline Phosphatase: 94 U/L (ref 38–126)
Anion gap: 10 (ref 5–15)
BUN: 15 mg/dL (ref 6–20)
CO2: 24 mmol/L (ref 22–32)
Calcium: 9.8 mg/dL (ref 8.9–10.3)
Chloride: 103 mmol/L (ref 98–111)
Creatinine, Ser: 0.68 mg/dL (ref 0.44–1.00)
GFR, Estimated: >60 mL/min (ref >60)
Glucose, Bld: 162 mg/dL — ABNORMAL HIGH (ref 70–99)
Potassium: 3.9 mmol/L (ref 3.5–5.1)
Sodium: 137 mmol/L (ref 135–145)
Total Bilirubin: 0.4 mg/dL (ref 0.3–1.2)
Total Protein: 8 g/dL (ref 6.5–8.1)

## 2021-04-14 MED ORDER — OXYCODONE-ACETAMINOPHEN 5-325 MG PO TABS
1.0000 | ORAL_TABLET | Freq: Once | ORAL | Status: AC
Start: 2021-04-14 — End: 2021-04-14
  Administered 2021-04-14: 1 via ORAL
  Filled 2021-04-14: qty 1

## 2021-04-14 NOTE — ED Triage Notes (Signed)
Pt arrives POV w/ c/o left flank pain, N/V, chills  Hx of kidney stones and this feels the same

## 2021-04-14 NOTE — ED Provider Notes (Signed)
Emergency Medicine Provider Triage Evaluation Note  [name] , a 55 y.o. female  was evaluated in triage.  Pt complains of left flank pain.  She states that she has had increasing left flank pain today.  States that at times it radiates to her left groin.  States that she has had associated chills, nausea and one episode of vomiting today.  She denies dysuria or hematuria.  She denies vaginal bleeding or vaginal symptoms.  She states that she has previously had kidney stones requiring lithotripsy and this feels similar.  She denies fevers.  Review of Systems  Positive: Left flank pain Negative: Fever  Physical Exam  BP (!) 173/82 (BP Location: Left Arm)   Pulse 93   Temp 98.1 F (36.7 C) (Oral)   Resp 18   SpO2 99%  Gen:   Awake, no distress   Resp:  Normal effort  MSK:   Moves extremities without difficulty  Other:  Positive bowel sounds.  Abdomen is protuberant, soft and nontender to palpation.  She does have left CVA tenderness.  No right CVA tenderness.  Medical Decision Making  Medically screening exam initiated at 5:24 PM.  Appropriate orders placed.  Kjersti Dittmer [place] was informed that the remainder of the evaluation will be completed by another provider, this initial triage assessment does not replace that evaluation, and the importance of remaining in the ED until their evaluation is complete.     Mickie Hillier, PA-C 04/14/21 1733    Godfrey Pick, MD 04/15/21 [phone]

## 2021-04-15 ENCOUNTER — Other Ambulatory Visit: Payer: Self-pay | Admitting: Urology

## 2021-04-15 DIAGNOSIS — N200 Calculus of kidney: Secondary | ICD-10-CM

## 2021-04-15 DIAGNOSIS — N201 Calculus of ureter: Secondary | ICD-10-CM | POA: Diagnosis not present

## 2021-04-15 NOTE — Progress Notes (Signed)
Left message with husband for patient to call for lithotripsy instructions.

## 2021-04-15 NOTE — Progress Notes (Signed)
Left message to call for pre-op instructions.

## 2021-04-16 ENCOUNTER — Telehealth: Payer: Self-pay | Admitting: Internal Medicine

## 2021-04-16 NOTE — Telephone Encounter (Signed)
Pt called in regards to an ED visit to [facility] she had two days ago. Pt let us know she was seen for kidney stones. She currently has a lipid alliance set up. Pt also states her CT found a large mass on her ovaries that is unrelated to the kidney stones. Also there is an issue with her appendix, but pt believes PCP is aware of her appendix. Pt has set up an appt with OBGYN for the large mass on her ovary. Pt wanted to give update to PCP  [phone]

## 2021-04-16 NOTE — H&P (Signed)
55 year old female with a history of nephrolithiasis, last seen by Dr. Eulogio Ditch in June 2021 who presented to the emergency department last night with acute onset right flank pain. CT scan was performed and demonstrated an 11 mm right UPJ stone. The patient was not seen in the emergency department because of the weight, but instead followed up early this morning in our office. Patient denies any voiding symptoms including frequency, dysuria, urgency. She has any gross hematuria. She has any fevers or chills.   Currently the patient's pain is reasonably well controlled, she is not having a lot of nausea.   The patient does have a history of stones, and has had shockwave lithotripsy in the past. She did well from this, tolerated it reasonably well, and her stones were PASSED.     ALLERGIES: Amoxicillin TABS Latex Exam Gloves MISC Septra TABS    MEDICATIONS: Claritin  Losartan Potassium 50 mg tablet  Ventolin HFA AERS Inhalation  Vitamin D3 1 PO Daily     GU PSH: ESWL - 2012 Ovary Removal Partial or Total - 2012       PSH Notes: Lithotripsy, Cholecystectomy, Oophorectomy, Cesarean Section   NON-GU PSH: Cesarean Delivery Only - 2012 Cholecystectomy (open) - 2012     GU PMH: Renal calculus, KUB today clear of stones. No recent stone episodes. - 11/29/2019, (Stable), She does have hypercalciuria. She did have improvement on Lozol. She does have high uric acid at times, - 2020, Kidney stone on right side, - 2014 Flank Pain - 2019 History of urolithiasis - 2019 Ureteral calculus - 2019      PMH Notes:  1898-06-08 00:00:00 - Note: Normal Routine History And Physical Adult   NON-GU PMH: Asthma, Asthma - 2014, Asthma, - 2014 Personal history of other diseases of the circulatory system, History of hypertension - 2014 Personal history of other diseases of the digestive system, History of esophageal reflux - 2014    FAMILY HISTORY: Asthma - Daughter Death - Father Family Health Status  Number - Runs In Family Hemorrhage - Father Lung Cancer - Sister nephrolithiasis - Mother Polycystic Kidneys - Runs In Family, Uncle   SOCIAL HISTORY: Marital Status: Married Preferred Language: English; Ethnicity: Not Hispanic Or Latino; Race: White Current Smoking Status: Patient has never smoked.   Tobacco Use Assessment Completed: Used Tobacco in last 30 days? Social Drinker.  Does not drink caffeine.     Notes: Caffeine Use, Never A Smoker, Occupation:, Marital History - Currently Married, Alcohol Use   REVIEW OF SYSTEMS:    GU Review Female:   Patient denies frequent urination, hard to postpone urination, burning /pain with urination, get up at night to urinate, leakage of urine, stream starts and stops, trouble starting your stream, have to strain to urinate, and being pregnant.  Gastrointestinal (Upper):   Patient reports vomiting and nausea. Patient denies indigestion/ heartburn.  Gastrointestinal (Lower):   Patient denies diarrhea and constipation.  Constitutional:   Patient denies fever, night sweats, weight loss, and fatigue.  Skin:   Patient denies skin rash/ lesion and itching.  Eyes:   Patient denies blurred vision and double vision.  Ears/ Nose/ Throat:   Patient denies sore throat and sinus problems.  Hematologic/Lymphatic:   Patient denies swollen glands and easy bruising.  Cardiovascular:   Patient denies leg swelling and chest pains.  Respiratory:   Patient denies cough and shortness of breath.  Endocrine:   Patient denies excessive thirst.  Musculoskeletal:   Patient reports back pain. Patient denies  joint pain.  Neurological:   Patient denies headaches and dizziness.  Psychologic:   Patient denies depression and anxiety.   Notes: left side back pain; chills    VITAL SIGNS:      04/15/2021 09:19 AM  Weight 165 lb / 74.84 kg  Height 67 in / 170.18 cm  BP 127/77 mmHg  Pulse 89 /min  Temperature 98.0 F / 36.6 C  BMI 25.8 kg/m   MULTI-SYSTEM PHYSICAL  EXAMINATION:    Constitutional: Well-nourished. No physical deformities. Normally developed. Good grooming.  Respiratory: Normal breath sounds. No labored breathing, no use of accessory muscles.   Cardiovascular: Regular rate and rhythm. No murmur, no gallop. Normal temperature, normal extremity pulses, no swelling, no varicosities.      Complexity of Data:  Source Of History:  Patient  Records Review:   Previous Doctor Records, Previous Patient Records  Urine Test Review:   Urinalysis  X-Ray Review: C.T. Abdomen/Pelvis: Reviewed Films. Discussed With Patient.     PROCEDURES:          Urinalysis w/Scope Dipstick Dipstick Cont'd Micro  Color: Straw Bilirubin: Neg mg/dL WBC/hpf: 6 - 10/hpf  Appearance: Clear Ketones: Neg mg/dL RBC/hpf: 3 - 10/hpf  Specific Gravity: 1.025 Blood: 2+ ery/uL Bacteria: Rare (0-9/hpf)  pH: 6.0 Protein: Trace mg/dL Cystals: NS (Not Seen)  Glucose: Neg mg/dL Urobilinogen: 0.2 mg/dL Casts: Hyaline    Nitrites: Neg Trichomonas: Not Present    Leukocyte Esterase: 1+ leu/uL Mucous: Not Present      Epithelial Cells: 0 - 5/hpf      Yeast: NS (Not Seen)      Sperm: Not Present    Notes: qns to spin    ASSESSMENT:      ICD-10 Details  1 GU:   Ureteral calculus - N20.1    PLAN:            Medications New Meds: Ondansetron Hcl 4 mg tablet 1 tablet PO Q 4 H PRN   #20  1 Refill(s)  Ultram 50 mg tablet 1-2 tablet PO Q 6 H PRN   #20  0 Refill(s)            Schedule Return Visit/Planned Activity: ASAP - Schedule Surgery          Document Letter(s):  Created for Patient: Clinical Summary         Notes:   Patient has a past 11 mm stone at the right UPJ. The Hounsfield units are approximately 1008 you and the skin stone distance is approximately 12 cm. I spoke with the patient at the management strategies and treatment options. Medical supportive therapy is unlikely to be successful given the size of the stone. We discussed the probability of passing the  stones with shockwave lithotripsy. We discussed the option to place a stent prior to procedure. We also discussed ureteroscopy and laser lithotripsy. Plan unfortunately, the patient has opted to proceed with shockwave lithotripsy, without stent placement prior. He does understand the risks of [place] and requiring stent or an additional procedure afterwards. The patient was counseled to stop taking ibuprofen or any NSAID/anticoagulant. We will try to get her scheduled for lithotripsy ASAP. CC: Dr. Orland Mustard, MD

## 2021-04-16 NOTE — Progress Notes (Signed)
Talked with patient. Meds and hx reviewed . Arrival time 0800 cl liquids until 0600 husband is the driver

## 2021-04-16 NOTE — Telephone Encounter (Signed)
FYI

## 2021-04-17 ENCOUNTER — Encounter (HOSPITAL_BASED_OUTPATIENT_CLINIC_OR_DEPARTMENT_OTHER)
Admission: RE | Disposition: A | Discharge status: Home / Self Care | Payer: Self-pay | Source: Home / Self Care | Attending: Urology

## 2021-04-17 ENCOUNTER — Ambulatory Visit (HOSPITAL_COMMUNITY): Payer: BC Managed Care – PPO

## 2021-04-17 ENCOUNTER — Encounter (HOSPITAL_BASED_OUTPATIENT_CLINIC_OR_DEPARTMENT_OTHER): Payer: Self-pay | Admitting: Urology

## 2021-04-17 ENCOUNTER — Ambulatory Visit (HOSPITAL_BASED_OUTPATIENT_CLINIC_OR_DEPARTMENT_OTHER)
Admission: RE | Admit: 2021-04-17 | Discharge: 2021-04-17 | Disposition: A | Discharge status: Home / Self Care | Payer: BC Managed Care – PPO | Attending: Urology | Admitting: Urology

## 2021-04-17 DIAGNOSIS — Q7649 Other congenital malformations of spine, not associated with scoliosis: Secondary | ICD-10-CM | POA: Diagnosis not present

## 2021-04-17 DIAGNOSIS — N200 Calculus of kidney: Secondary | ICD-10-CM | POA: Diagnosis not present

## 2021-04-17 DIAGNOSIS — N201 Calculus of ureter: Secondary | ICD-10-CM | POA: Diagnosis not present

## 2021-04-17 HISTORY — PX: EXTRACORPOREAL SHOCK WAVE LITHOTRIPSY: SHX1557

## 2021-04-17 LAB — POCT PREGNANCY, URINE: Preg Test, Ur: NEGATIVE

## 2021-04-17 SURGERY — LITHOTRIPSY, ESWL
Anesthesia: LOCAL | Laterality: Left

## 2021-04-17 MED ORDER — TRAMADOL HCL 50 MG PO TABS
ORAL_TABLET | ORAL | Status: AC
  Filled 2021-04-17: qty 1

## 2021-04-17 MED ORDER — ALFUZOSIN HCL ER 10 MG PO TB24: 10.0000 mg | ORAL_TABLET | Freq: Every day | ORAL | 0 refills | Status: AC

## 2021-04-17 MED ORDER — DIPHENHYDRAMINE HCL 25 MG PO CAPS
ORAL_CAPSULE | ORAL | Status: AC
  Filled 2021-04-17: qty 1

## 2021-04-17 MED ORDER — DIPHENHYDRAMINE HCL 25 MG PO CAPS
25.0000 mg | ORAL_CAPSULE | ORAL | Status: AC
  Administered 2021-04-17: 25 mg via ORAL

## 2021-04-17 MED ORDER — SODIUM CHLORIDE 0.9 % IV SOLN: INTRAVENOUS | Status: DC

## 2021-04-17 MED ORDER — TRAMADOL HCL 50 MG PO TABS
50.0000 mg | ORAL_TABLET | Freq: Once | ORAL | Status: AC
  Administered 2021-04-17: 50 mg via ORAL

## 2021-04-17 MED ORDER — CIPROFLOXACIN HCL 500 MG PO TABS
500.0000 mg | ORAL_TABLET | ORAL | Status: AC
  Administered 2021-04-17: 500 mg via ORAL

## 2021-04-17 MED ORDER — CIPROFLOXACIN HCL 500 MG PO TABS
ORAL_TABLET | ORAL | Status: AC
  Filled 2021-04-17: qty 1

## 2021-04-17 MED ORDER — DIAZEPAM 5 MG PO TABS
10.0000 mg | ORAL_TABLET | ORAL | Status: AC
  Administered 2021-04-17: 10 mg via ORAL

## 2021-04-17 MED ORDER — DIAZEPAM 5 MG PO TABS
ORAL_TABLET | ORAL | Status: AC
  Filled 2021-04-17: qty 2

## 2021-04-17 NOTE — Progress Notes (Signed)
Red blotchy area noted on left flank from Lithotripsy.

## 2021-04-17 NOTE — Op Note (Signed)
04/17/2021   10:55 AM   PATIENT:  Zetta Bills Motta  55 y.o. female   PRE-OPERATIVE DIAGNOSIS:  LEFT URETERAL PELVIC JUNCTION STONE   POST-OPERATIVE DIAGNOSIS:  Same PROCEDURE:  Procedure(s): LEFT EXTRACORPOREAL SHOCK WAVE LITHOTRIPSY (ESWL) (Left)   SURGEON:  Surgeon(s) and Role:    * Remi Haggard, MD - Primary   PHYSICIAN ASSISTANT:    ASSISTANTS: none    ANESTHESIA:   IV sedation   EBL:  minimal   BLOOD ADMINISTERED:none   DRAINS: none    LOCAL MEDICATIONS USED:  NONE   SPECIMEN:  No Specimen   DISPOSITION OF SPECIMEN:  N/A   COUNTS:  YES   TOURNIQUET:  * No tourniquets in log *   DICTATION: .Note written in EPIC   PLAN OF CARE: Discharge to home after PACU   PATIENT DISPOSITION:  PACU - hemodynamically stable.   Delay start of Pharmacological VTE agent (>24hrs) due to surgical blood loss or risk of bleeding: not applicable

## 2021-04-17 NOTE — Interval H&P Note (Signed)
History and Physical Interval Note:  04/17/2021 9:53 AM  Dawn Russell  has presented today for surgery, with the diagnosis of LEFT URETERAL PELVIC JUNCTION STONE.  The various methods of treatment have been discussed with the patient and family. After consideration of risks, benefits and other options for treatment, the patient has consented to  Procedure(s): LEFT EXTRACORPOREAL SHOCK WAVE LITHOTRIPSY (ESWL) (Left) as a surgical intervention.  The patient's history has been reviewed, patient examined, no change in status, stable for surgery.  I have reviewed the patient's chart and labs.  Questions were answered to the patient's satisfaction.     Remi Haggard

## 2021-04-17 NOTE — Brief Op Note (Signed)
04/17/2021  10:55 AM  PATIENT:  Dawn Russell  55 y.o. female  PRE-OPERATIVE DIAGNOSIS:  LEFT URETERAL PELVIC JUNCTION STONE  POST-OPERATIVE DIAGNOSIS:  Same PROCEDURE:  Procedure(s): LEFT EXTRACORPOREAL SHOCK WAVE LITHOTRIPSY (ESWL) (Left)  SURGEON:  Surgeon(s) and Role:    * Remi Haggard, MD - Primary  PHYSICIAN ASSISTANT:   ASSISTANTS: none   ANESTHESIA:   IV sedation  EBL:  minimal  BLOOD ADMINISTERED:none  DRAINS: none   LOCAL MEDICATIONS USED:  NONE  SPECIMEN:  No Specimen  DISPOSITION OF SPECIMEN:  N/A  COUNTS:  YES  TOURNIQUET:  * No tourniquets in log *  DICTATION: .Note written in EPIC  PLAN OF CARE: Discharge to home after PACU  PATIENT DISPOSITION:  PACU - hemodynamically stable.   Delay start of Pharmacological VTE agent (>24hrs) due to surgical blood loss or risk of bleeding: not applicable

## 2021-04-18 ENCOUNTER — Encounter (HOSPITAL_BASED_OUTPATIENT_CLINIC_OR_DEPARTMENT_OTHER): Payer: Self-pay | Admitting: Urology

## 2021-04-23 ENCOUNTER — Other Ambulatory Visit: Payer: Self-pay | Admitting: Obstetrics and Gynecology

## 2021-04-23 DIAGNOSIS — N200 Calculus of kidney: Secondary | ICD-10-CM | POA: Diagnosis not present

## 2021-04-23 DIAGNOSIS — N83209 Unspecified ovarian cyst, unspecified side: Secondary | ICD-10-CM

## 2021-04-24 ENCOUNTER — Other Ambulatory Visit: Payer: Self-pay | Admitting: Obstetrics and Gynecology

## 2021-04-24 DIAGNOSIS — N83209 Unspecified ovarian cyst, unspecified side: Secondary | ICD-10-CM

## 2021-04-29 ENCOUNTER — Ambulatory Visit
Admission: RE | Admit: 2021-04-29 | Discharge: 2021-04-29 | Disposition: A | Discharge status: Home / Self Care | Payer: BC Managed Care – PPO | Source: Ambulatory Visit | Attending: Obstetrics and Gynecology | Admitting: Obstetrics and Gynecology

## 2021-04-29 ENCOUNTER — Other Ambulatory Visit: Payer: Self-pay

## 2021-04-29 DIAGNOSIS — D259 Leiomyoma of uterus, unspecified: Secondary | ICD-10-CM | POA: Diagnosis not present

## 2021-04-29 DIAGNOSIS — N83202 Unspecified ovarian cyst, left side: Secondary | ICD-10-CM | POA: Diagnosis not present

## 2021-04-29 DIAGNOSIS — R1900 Intra-abdominal and pelvic swelling, mass and lump, unspecified site: Secondary | ICD-10-CM | POA: Diagnosis not present

## 2021-04-29 DIAGNOSIS — N888 Other specified noninflammatory disorders of cervix uteri: Secondary | ICD-10-CM | POA: Diagnosis not present

## 2021-04-29 DIAGNOSIS — N83209 Unspecified ovarian cyst, unspecified side: Secondary | ICD-10-CM

## 2021-04-30 DIAGNOSIS — R1900 Intra-abdominal and pelvic swelling, mass and lump, unspecified site: Secondary | ICD-10-CM | POA: Diagnosis not present

## 2021-05-05 DIAGNOSIS — L2389 Allergic contact dermatitis due to other agents: Secondary | ICD-10-CM | POA: Diagnosis not present

## 2021-05-06 ENCOUNTER — Other Ambulatory Visit: Payer: Self-pay | Admitting: Obstetrics and Gynecology

## 2021-05-06 DIAGNOSIS — R1900 Intra-abdominal and pelvic swelling, mass and lump, unspecified site: Secondary | ICD-10-CM

## 2021-05-12 ENCOUNTER — Telehealth: Payer: Self-pay | Admitting: Internal Medicine

## 2021-05-12 ENCOUNTER — Other Ambulatory Visit: Payer: Self-pay

## 2021-05-12 ENCOUNTER — Ambulatory Visit (INDEPENDENT_AMBULATORY_CARE_PROVIDER_SITE_OTHER): Payer: BC Managed Care – PPO

## 2021-05-12 DIAGNOSIS — Z23 Encounter for immunization: Secondary | ICD-10-CM

## 2021-05-12 NOTE — Telephone Encounter (Signed)
FYI

## 2021-05-12 NOTE — Telephone Encounter (Signed)
The patient wanted to give the provider an [place] . She stated that her gynecologist is following up on an ovarian cyst that was found on her ultrasound . She has had a CT and MRI. She may be scheduled to have it removed by her GYN in January.

## 2021-05-15 DIAGNOSIS — N200 Calculus of kidney: Secondary | ICD-10-CM | POA: Diagnosis not present

## 2021-05-22 ENCOUNTER — Ambulatory Visit
Admission: RE | Admit: 2021-05-22 | Discharge: 2021-05-22 | Disposition: A | Discharge status: Home / Self Care | Payer: BC Managed Care – PPO | Source: Ambulatory Visit | Attending: Obstetrics and Gynecology | Admitting: Obstetrics and Gynecology

## 2021-05-22 ENCOUNTER — Other Ambulatory Visit: Payer: Self-pay

## 2021-05-22 DIAGNOSIS — N83202 Unspecified ovarian cyst, left side: Secondary | ICD-10-CM | POA: Diagnosis not present

## 2021-05-22 DIAGNOSIS — N888 Other specified noninflammatory disorders of cervix uteri: Secondary | ICD-10-CM | POA: Diagnosis not present

## 2021-05-22 DIAGNOSIS — D259 Leiomyoma of uterus, unspecified: Secondary | ICD-10-CM | POA: Diagnosis not present

## 2021-05-22 DIAGNOSIS — K5730 Diverticulosis of large intestine without perforation or abscess without bleeding: Secondary | ICD-10-CM | POA: Diagnosis not present

## 2021-05-22 DIAGNOSIS — R1900 Intra-abdominal and pelvic swelling, mass and lump, unspecified site: Secondary | ICD-10-CM

## 2021-05-22 MED ORDER — GADOBENATE DIMEGLUMINE 529 MG/ML IV SOLN
16.0000 mL | Freq: Once | INTRAVENOUS | Status: AC | PRN
  Administered 2021-05-22: 16 mL via INTRAVENOUS

## 2021-05-27 DIAGNOSIS — R1900 Intra-abdominal and pelvic swelling, mass and lump, unspecified site: Secondary | ICD-10-CM | POA: Diagnosis not present

## 2021-05-28 DIAGNOSIS — N83292 Other ovarian cyst, left side: Secondary | ICD-10-CM | POA: Diagnosis not present

## 2021-05-28 DIAGNOSIS — N83202 Unspecified ovarian cyst, left side: Secondary | ICD-10-CM | POA: Diagnosis not present

## 2021-05-28 DIAGNOSIS — R1900 Intra-abdominal and pelvic swelling, mass and lump, unspecified site: Secondary | ICD-10-CM | POA: Diagnosis not present

## 2021-05-28 DIAGNOSIS — Z01810 Encounter for preprocedural cardiovascular examination: Secondary | ICD-10-CM | POA: Diagnosis not present

## 2021-06-08 HISTORY — PX: ABDOMINAL HYSTERECTOMY: SHX81

## 2021-06-11 DIAGNOSIS — L718 Other rosacea: Secondary | ICD-10-CM | POA: Diagnosis not present

## 2021-06-11 DIAGNOSIS — L2389 Allergic contact dermatitis due to other agents: Secondary | ICD-10-CM | POA: Diagnosis not present

## 2021-06-24 DIAGNOSIS — N838 Other noninflammatory disorders of ovary, fallopian tube and broad ligament: Secondary | ICD-10-CM | POA: Diagnosis not present

## 2021-06-24 DIAGNOSIS — Z90721 Acquired absence of ovaries, unilateral: Secondary | ICD-10-CM | POA: Diagnosis not present

## 2021-06-24 DIAGNOSIS — C562 Malignant neoplasm of left ovary: Secondary | ICD-10-CM | POA: Diagnosis not present

## 2021-06-24 DIAGNOSIS — K660 Peritoneal adhesions (postprocedural) (postinfection): Secondary | ICD-10-CM | POA: Diagnosis not present

## 2021-06-24 DIAGNOSIS — R1900 Intra-abdominal and pelvic swelling, mass and lump, unspecified site: Secondary | ICD-10-CM | POA: Diagnosis not present

## 2021-06-24 DIAGNOSIS — N888 Other specified noninflammatory disorders of cervix uteri: Secondary | ICD-10-CM | POA: Diagnosis not present

## 2021-06-24 DIAGNOSIS — D3912 Neoplasm of uncertain behavior of left ovary: Secondary | ICD-10-CM | POA: Diagnosis not present

## 2021-06-24 DIAGNOSIS — D251 Intramural leiomyoma of uterus: Secondary | ICD-10-CM | POA: Diagnosis not present

## 2021-07-21 ENCOUNTER — Other Ambulatory Visit: Payer: Self-pay | Admitting: Internal Medicine

## 2021-08-04 ENCOUNTER — Ambulatory Visit (INDEPENDENT_AMBULATORY_CARE_PROVIDER_SITE_OTHER): Payer: BC Managed Care – PPO | Admitting: Internal Medicine

## 2021-08-04 ENCOUNTER — Other Ambulatory Visit: Payer: Self-pay

## 2021-08-04 ENCOUNTER — Encounter: Payer: Self-pay | Admitting: Internal Medicine

## 2021-08-04 VITALS — BP 130/74 | HR 80 | Temp 97.6°F | Ht 67.0 in | Wt 172.6 lb

## 2021-08-04 DIAGNOSIS — I10 Essential (primary) hypertension: Secondary | ICD-10-CM | POA: Diagnosis not present

## 2021-08-04 DIAGNOSIS — E210 Primary hyperparathyroidism: Secondary | ICD-10-CM

## 2021-08-04 DIAGNOSIS — D72829 Elevated white blood cell count, unspecified: Secondary | ICD-10-CM

## 2021-08-04 DIAGNOSIS — E663 Overweight: Secondary | ICD-10-CM

## 2021-08-04 DIAGNOSIS — N200 Calculus of kidney: Secondary | ICD-10-CM

## 2021-08-04 DIAGNOSIS — E785 Hyperlipidemia, unspecified: Secondary | ICD-10-CM | POA: Diagnosis not present

## 2021-08-04 DIAGNOSIS — R7303 Prediabetes: Secondary | ICD-10-CM | POA: Diagnosis not present

## 2021-08-04 DIAGNOSIS — C562 Malignant neoplasm of left ovary: Secondary | ICD-10-CM

## 2021-08-04 LAB — CBC WITH DIFFERENTIAL/PLATELET
Basophils Absolute: 0 10*3/uL (ref 0.0–0.1)
Basophils Relative: 0.5 % (ref 0.0–3.0)
Eosinophils Absolute: 0.2 10*3/uL (ref 0.0–0.7)
Eosinophils Relative: 2.2 % (ref 0.0–5.0)
HCT: 42.2 % (ref 36.0–46.0)
Hemoglobin: 14.1 g/dL (ref 12.0–15.0)
Lymphocytes Relative: 26.6 % (ref 12.0–46.0)
Lymphs Abs: 2.3 10*3/uL (ref 0.7–4.0)
MCHC: 33.3 g/dL (ref 30.0–36.0)
MCV: 80.3 fl (ref 78.0–100.0)
Monocytes Absolute: 0.4 10*3/uL (ref 0.1–1.0)
Monocytes Relative: 4.2 % (ref 3.0–12.0)
Neutro Abs: 5.8 10*3/uL (ref 1.4–7.7)
Neutrophils Relative %: 66.5 % (ref 43.0–77.0)
Platelets: 271 10*3/uL (ref 150.0–400.0)
RBC: 5.25 Mil/uL — ABNORMAL HIGH (ref 3.87–5.11)
RDW: 13.8 % (ref 11.5–15.5)
WBC: 8.7 10*3/uL (ref 4.0–10.5)

## 2021-08-04 LAB — LIPID PANEL
Cholesterol: 206 mg/dL — ABNORMAL HIGH (ref 0–200)
HDL: 72.8 mg/dL (ref >39.00)
LDL Cholesterol: 114 mg/dL — ABNORMAL HIGH (ref 0–99)
NonHDL: 133.51
Total CHOL/HDL Ratio: 3
Triglycerides: 99 mg/dL (ref 0.0–149.0)
VLDL: 19.8 mg/dL (ref 0.0–40.0)

## 2021-08-04 LAB — URINALYSIS, ROUTINE W REFLEX MICROSCOPIC
Bilirubin Urine: NEGATIVE
Hgb urine dipstick: NEGATIVE
Ketones, ur: NEGATIVE
Leukocytes,Ua: NEGATIVE
Nitrite: NEGATIVE
RBC / HPF: NONE SEEN (ref 0–?)
Specific Gravity, Urine: 1.01 (ref 1.000–1.030)
Total Protein, Urine: NEGATIVE
Urine Glucose: NEGATIVE
Urobilinogen, UA: 0.2 (ref 0.0–1.0)
WBC, UA: NONE SEEN (ref 0–?)
pH: 5.5 (ref 5.0–8.0)

## 2021-08-04 LAB — COMPREHENSIVE METABOLIC PANEL
ALT: 30 U/L (ref 0–35)
AST: 21 U/L (ref 0–37)
Albumin: 4.9 g/dL (ref 3.5–5.2)
Alkaline Phosphatase: 92 U/L (ref 39–117)
BUN: 15 mg/dL (ref 6–23)
CO2: 29 mEq/L (ref 19–32)
Calcium: 10.3 mg/dL (ref 8.4–10.5)
Chloride: 102 mEq/L (ref 96–112)
Creatinine, Ser: 0.71 mg/dL (ref 0.40–1.20)
GFR: 94.95 mL/min (ref >60.00)
Glucose, Bld: 114 mg/dL — ABNORMAL HIGH (ref 70–99)
Potassium: 3.7 mEq/L (ref 3.5–5.1)
Sodium: 139 mEq/L (ref 135–145)
Total Bilirubin: 0.4 mg/dL (ref 0.2–1.2)
Total Protein: 8.1 g/dL (ref 6.0–8.3)

## 2021-08-04 LAB — MICROALBUMIN / CREATININE URINE RATIO
Creatinine,U: 44.6 mg/dL
Microalb Creat Ratio: 1.6 mg/g (ref 0.0–30.0)
Microalb, Ur: <0.7 mg/dL (ref 0.0–1.9)

## 2021-08-04 LAB — HEMOGLOBIN A1C: Hgb A1c MFr Bld: 7 % — ABNORMAL HIGH (ref 4.6–6.5)

## 2021-08-04 NOTE — Assessment & Plan Note (Signed)
Reviewed post op follow visit with GYN/onc:  Recurrent serous LMP ovarian tumor cNED  Plan Continue with postoperative instructions Recommend cancer surveillance Ideally, would not recommend ERT since these tumors are often ER+; will continue to assess based on patient's menopausal symptoms (minimal currently) RTC 6 months with CA-125

## 2021-08-04 NOTE — Patient Instructions (Signed)
Whew!    Glad everything is now OVER .   Checking your white blood cell count today.  If still elevated  will make a non urgent referral to hematology

## 2021-08-04 NOTE — Assessment & Plan Note (Signed)
I have addressed  BMI and recommended a low glycemic index diet utilizing smaller more frequent meals to increase metabolism.  I have also recommended that patient start exercising with a goal of 30 minutes of aerobic exercise a minimum of 5 days per week. Screening for lipid disorders, thyroid and diabetes to be done today.   

## 2021-08-04 NOTE — Assessment & Plan Note (Signed)
Resolved s/p partial  parathyroidectomy

## 2021-08-04 NOTE — Progress Notes (Signed)
Subjective:  Patient ID: Dawn Russell, female    DOB: 05-21-1966  Age: 56 y.o. MRN: [id number]  CC: The primary encounter diagnosis was Primary hypertension. 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   This visit occurred during the SARS-CoV-2 public health emergency.  Safety protocols were in place, including screening questions prior to the visit, additional usage of staff PPE, and extensive cleaning of exam room while observing appropriate contact time as indicated for disinfecting solutions.    HPI Dawn Russell presents for  Chief Complaint  Patient presents with   Follow-up    F/u - Prediabetes   SINCE HER LAST VISIT  1) s/p parathyroidectomy  partial in June  taking 400 Ius  vitamin D   2)  s/p TAH/left BSO  at [facility] on January. 17  Done at  [place].   s/p robotic surgery for recurrent serous LMP tumor on eft ovary (right ovary 20 years ago)   Cancer surveillance continued  .with CA 125 every 6 months.   Incidental finding on CT of kidneys .  No post op pain.  Advised to wait 6 weeks to  exercise.    3) lithotripsy In   Apr 23, 2021) sister died at 39 in 08-03-2019 .   Has had increased emotional  drama due to mother's needing relocation and supervision due to dementia,  57 yrs old. Now living in [place] near her other sister in an A/L facility    Outpatient Medications Prior to Visit  Medication Sig Dispense Refill   DOXYCYCLINE HYCLATE PO Take by mouth.     tretinoin (RETIN-A) 0.01 % gel Apply topically as needed.     albuterol (VENTOLIN HFA) 108 (90 Base) MCG/ACT inhaler INHALE TWO PUFFS 4 TIMES DAILY AS NEEDED FOR  WHEEZING (Patient taking differently: Inhale 2 puffs into the lungs 4  (four) times daily as needed for wheezing.) 18 g 3   cholecalciferol (VITAMIN D) 25 MCG (1000 UNIT) tablet Take 4,000 Units by mouth daily.     loratadine (CLARITIN) 10 MG tablet Take 10 mg by mouth daily.     losartan (COZAAR) 50 MG tablet TAKE 1 TABLET DAILY 90 tablet 0   sodium chloride (OCEAN) 0.65 % SOLN nasal spray Place 1 spray into both nostrils as needed for congestion.     ondansetron (ZOFRAN) 4 MG tablet Take 4 mg by mouth every 4 (four) hours.     traMADol (ULTRAM) 50 MG tablet Take 50 mg by mouth every 6 (six) hours as needed for severe pain.     No facility-administered medications prior to visit.    Review of Systems;  Patient denies headache, fevers, malaise, unintentional weight loss, skin rash, eye pain, sinus congestion and sinus pain, sore throat, dysphagia,  hemoptysis , cough, dyspnea, wheezing, chest pain, palpitations, orthopnea, edema, abdominal pain, nausea, melena, diarrhea, constipation, flank pain, dysuria, hematuria, urinary  Frequency, nocturia, numbness, tingling, seizures,  Focal weakness, Loss of consciousness,  Tremor, insomnia, depression, anxiety, and suicidal ideation.      Objective:  BP 130/74 (BP Location: Left Arm, Patient Position: Sitting, Cuff Size: Small)    Pulse 80    Temp 97.6 F (36.4 C) (Oral)    Ht _0  (1.702 m)    Wt 172 lb 9.6 oz (78.3  kg)    SpO2 98%    BMI 27.03 kg/m   BP Readings from Last 3 Encounters:  08/04/21 130/74  04/17/21 (!) 151/83  04/15/21 (!) 149/84    Wt Readings from Last 3 Encounters:  08/04/21 172 lb 9.6 oz (78.3 kg)  04/17/21 172 lb 8 oz (78.2 kg)  04/08/21 175 lb 9.6 oz (79.7 kg)    General appearance: alert, cooperative and appears stated age Ears: normal TM's and external ear canals both ears Throat: lips, mucosa, and tongue normal; teeth and gums normal Neck: no adenopathy, no carotid bruit, supple, symmetrical, trachea midline and thyroid not enlarged, symmetric, no tenderness/mass/nodules Back:  symmetric, no curvature. ROM normal. No CVA tenderness. Lungs: clear to auscultation bilaterally Heart: regular rate and rhythm, S1, S2 normal, no murmur, click, rub or gallop Abdomen: soft, non-tender; bowel sounds normal; no masses,  no organomegaly Pulses: 2+ and symmetric Skin: Skin color, texture, turgor normal. No rashes or lesions Lymph nodes: Cervical, supraclavicular, and axillary nodes normal.  Lab Results  Component Value Date   HGBA1C 5.9 (H) 07/01/2018   HGBA1C 5.8 11/26/2017   HGBA1C 5.8 05/28/2017    Lab Results  Component Value Date   CREATININE 0.68 04/14/2021   CREATININE 0.63 11/26/2020   CREATININE 0.76 10/03/2020    Lab Results  Component Value Date   WBC 12.3 (H) 04/14/2021   HGB 13.8 04/14/2021   HCT 42.3 04/14/2021   PLT 301 04/14/2021   GLUCOSE 162 (H) 04/14/2021   CHOL 194 11/26/2017   TRIG 66.0 11/26/2017   HDL 69.40 11/26/2017   LDLDIRECT 141.8 12/14/2012   LDLCALC 112 (H) 11/26/2017   ALT 43 04/14/2021   AST 27 04/14/2021   NA 137 04/14/2021   K 3.9 04/14/2021   CL 103 04/14/2021   CREATININE 0.68 04/14/2021   BUN 15 04/14/2021   CO2 24 04/14/2021   TSH 1.13 05/26/2016   HGBA1C 5.9 (H) 07/01/2018    MR PELVIS W WO CONTRAST  Result Date: 05/23/2021 CLINICAL DATA:  Cystic pelvic mass. EXAM: MRI PELVIS WITHOUT AND WITH CONTRAST TECHNIQUE: Multiplanar multisequence MR imaging of the pelvis was performed both before and after administration of intravenous contrast. CONTRAST:  78m MULTIHANCE GADOBENATE DIMEGLUMINE 529 MG/ML IV SOLN COMPARISON:  Ultrasound on 04/29/2021, and CT on 04/14/2021 FINDINGS: Lower Urinary Tract: No urinary bladder or urethral abnormality identified. Bowel: Mild sigmoid diverticulosis, without evidence of diverticulitis. Vascular/Lymphatic: Unremarkable. No pathologically enlarged pelvic lymph nodes identified. Reproductive: -- Uterus: Measures 8.3 x 3.8 x 4.5 cm (volume = 74 cm^3). A 1.4 cm fibroid is seen in the right  anterior uterine corpus. No abnormal endometrial thickening. Cervical nabothian cysts are incidentally noted. -- Right ovary: Not visualized, however no adnexal mass identified. -- Left ovary: A complex cystic lesion is seen in the anterior pelvis which appears to originate from the left adnexa. This measures 9.4 x 7.3 by 8.8 cm. This lesion shows diffuse mural enhancement and nodular thickening, with largest nodule along the left lateral wall measuring 1.7 cm. This is consistent with a cystic ovarian neoplasm, and malignancy cannot be excluded. Other: No peritoneal thickening or abnormal free fluid. Musculoskeletal:  Unremarkable. IMPRESSION: 9.4 cm complex cystic mass in the anterior pelvis which appears to originate from the left adnexa. This is consistent with a cystic ovarian neoplasm, and malignancy cannot be excluded. No evidence of pelvic metastatic disease. 1.4 cm uterine fibroid. Mild sigmoid diverticulosis, without evidence of diverticulitis. Electronically Signed   By: JMyles RosenthalD.  On: 05/23/2021 18:42    Assessment & Plan:   Problem List Items Addressed This Visit     Calcium oxalate renal calculi    S/p lithotripsy.  Will recheck urine today. Hopefully resolution of hypercalcemia secondary to hyperparathyroidism will reduce recurrence..  Citric acid supplementation advised       Relevant Orders   Urinalysis, Routine w reflex microscopic   Hypertension - Primary   Relevant Orders   Comp Met (CMET)   Urine Microalbumin w/creat. ratio   Overweight (BMI 25.0-29.9)    I have addressed  BMI and recommended a low glycemic index diet utilizing smaller more frequent meals to increase metabolism.  I have also recommended that patient start exercising with a goal of 30 minutes of aerobic exercise a minimum of 5 days per week. Screening for lipid disorders, thyroid and diabetes to be done today.        Prediabetes   Relevant Orders   HgB A1c   Primary hyperparathyroidism (HCC)    Relevant Orders   TSH   Hypercalcemia    Resolved s/p partial  parathyroidectomy      Primary malignant neoplasm of ovary ([place])    Reviewed post op follow visit with GYN/onc:  Recurrent serous LMP ovarian tumor cNED  Plan Continue with postoperative instructions Recommend cancer surveillance Ideally, would not recommend ERT since these tumors are often ER+; will continue to assess based on patient's menopausal symptoms (minimal currently) RTC 6 months with CA-125       Relevant Medications   DOXYCYCLINE HYCLATE PO   Other Visit Diagnoses     Hyperlipidemia, unspecified hyperlipidemia type       Relevant Orders   Lipid Profile   Leukocytosis, unspecified type       Relevant Orders   CBC with Differential/Platelet       I spent 30 minutes dedicated to the care of this patient on the date of this encounter to include pre-visit review of patient's medical history,  most recent imaging studies, Face-to-face time with the patient , and post visit ordering of testing and therapeutics.    Follow-up: No follow-ups on file.   Crecencio Mc, MD

## 2021-08-04 NOTE — Assessment & Plan Note (Signed)
S/p lithotripsy.  Will recheck urine today. Hopefully resolution of hypercalcemia secondary to hyperparathyroidism will reduce recurrence..  Citric acid supplementation advised

## 2021-08-05 DIAGNOSIS — D225 Melanocytic nevi of trunk: Secondary | ICD-10-CM | POA: Diagnosis not present

## 2021-08-05 DIAGNOSIS — L578 Other skin changes due to chronic exposure to nonionizing radiation: Secondary | ICD-10-CM | POA: Diagnosis not present

## 2021-08-05 DIAGNOSIS — D239 Other benign neoplasm of skin, unspecified: Secondary | ICD-10-CM | POA: Diagnosis not present

## 2021-08-05 DIAGNOSIS — D485 Neoplasm of uncertain behavior of skin: Secondary | ICD-10-CM | POA: Diagnosis not present

## 2021-08-05 DIAGNOSIS — L720 Epidermal cyst: Secondary | ICD-10-CM | POA: Diagnosis not present

## 2021-08-05 DIAGNOSIS — L918 Other hypertrophic disorders of the skin: Secondary | ICD-10-CM | POA: Diagnosis not present

## 2021-08-05 LAB — TSH: TSH: 1.18 u[IU]/mL (ref 0.35–5.50)

## 2021-08-27 ENCOUNTER — Ambulatory Visit (INDEPENDENT_AMBULATORY_CARE_PROVIDER_SITE_OTHER): Payer: BC Managed Care – PPO | Admitting: Internal Medicine

## 2021-08-27 ENCOUNTER — Encounter: Payer: Self-pay | Admitting: Internal Medicine

## 2021-08-27 ENCOUNTER — Other Ambulatory Visit: Payer: Self-pay

## 2021-08-27 VITALS — BP 124/82 | HR 83 | Temp 97.6°F | Ht 67.0 in | Wt 169.6 lb

## 2021-08-27 DIAGNOSIS — Z23 Encounter for immunization: Secondary | ICD-10-CM | POA: Diagnosis not present

## 2021-08-27 DIAGNOSIS — I152 Hypertension secondary to endocrine disorders: Secondary | ICD-10-CM

## 2021-08-27 DIAGNOSIS — E1159 Type 2 diabetes mellitus with other circulatory complications: Secondary | ICD-10-CM

## 2021-08-27 MED ORDER — SCOPOLAMINE 1 MG/3DAYS TD PT72: 1.0000 | MEDICATED_PATCH | TRANSDERMAL | 0 refills | Status: DC

## 2021-08-27 NOTE — Patient Instructions (Addendum)
Zinc and vitamin c  are are ok to take daily  but not mandatory if you are eating a diet that has fruit and veggies (see the list)b ? ?You received a pneumonia vaccine today ? ?Annual "diabetic eye exam"  is advised .  Ask eye doctor to send me the annual report  ? ?Protect  feet: don't walk barefoot outside.   ? ?Repeat labs in 3 to 6 months  ? ? ? ? ? ? ? ?

## 2021-08-27 NOTE — Progress Notes (Signed)
? ?Subjective:  ?Patient ID: Dawn Russell, female    DOB: 04-Dec-1965  Age: 56 y.o. MRN: [id number] ? ?CC: The encounter diagnosis was Hypertension associated with type 2 diabetes mellitus ([place]). ? ? ?This visit occurred during the SARS-CoV-2 public health emergency.  Safety protocols were in place, including screening questions prior to the visit, additional usage of staff PPE, and extensive cleaning of exam room while observing appropriate contact time as indicated for disinfecting solutions.   ? ?HPI ?Dawn Russell presents for follow up on new onset diabetes  ?Chief Complaint  ?Patient presents with  ? Follow-up  ?  Discuss new onset diabetes  ? ?56 yr old female with history of prediabetes,  with IPG and overweight ,  recently noted to have A1c of 7.0  in the setting of reduced activity and weight gain.  She denies polyuria, polydipsia,  and since receiving the call has modified her diet and started walking ,  with a 3 lb weight loss despite going to [employer] with her daughter .   ?I provided  30 minutes  during this encounter reviewing patient's current lifestyle and dietary habits.  She is very motivated to address with lifestyle changes rather than medications.  ? ? ?Outpatient Medications Prior to Visit  ?Medication Sig Dispense Refill  ? albuterol (VENTOLIN HFA) 108 (90 Base) MCG/ACT inhaler INHALE TWO PUFFS 4 TIMES DAILY AS NEEDED FOR  WHEEZING (Patient taking differently: Inhale 2 puffs into the lungs 4 (four) times daily as needed for wheezing.) 18 g 3  ? Azelaic Acid 15 % gel Apply 1 application. topically 2 (two) times daily.    ? cholecalciferol (VITAMIN D) 25 MCG (1000 UNIT) tablet Take 4,000 Units by mouth daily.    ? DOXYCYCLINE HYCLATE PO Take by mouth.    ? loratadine (CLARITIN) 10 MG tablet Take 10 mg by mouth daily.    ? losartan (COZAAR) 50 MG tablet TAKE 1 TABLET DAILY 90 tablet 0  ? sodium chloride (OCEAN) 0.65 % SOLN nasal spray Place 1 spray into both nostrils as needed for  congestion.    ? tretinoin (RETIN-A) 0.01 % gel Apply topically as needed. (Patient not taking: Reported on 08/27/2021)    ? ?No facility-administered medications prior to visit.  ? ? ?Review of Systems; ? ?Patient denies headache, fevers, malaise, unintentional weight loss, skin rash, eye pain, sinus congestion and sinus pain, sore throat, dysphagia,  hemoptysis , cough, dyspnea, wheezing, chest pain, palpitations, orthopnea, edema, abdominal pain, nausea, melena, diarrhea, constipation, flank pain, dysuria, hematuria, urinary  Frequency, nocturia, numbness, tingling, seizures,  Focal weakness, Loss of consciousness,  Tremor, insomnia, depression, anxiety, and suicidal ideation.   ? ? ? ?Objective:  ?BP 124/82 (BP Location: Left Arm, Patient Position: Sitting, Cuff Size: Normal)   Pulse 83   Temp 97.6 ?F (36.4 ?C) (Oral)   Ht '5\' 7"'$  (1.702 m)   Wt 169 lb 9.6 oz (76.9 kg)   SpO2 99%   BMI 26.56 kg/m?  ? ?BP Readings from Last 3 Encounters:  ?08/27/21 124/82  ?08/04/21 130/74  ?04/17/21 (!) 151/83  ? ? ?Wt Readings from Last 3 Encounters:  ?08/27/21 169 lb 9.6 oz (76.9 kg)  ?08/04/21 172 lb 9.6 oz (78.3 kg)  ?04/17/21 172 lb 8 oz (78.2 kg)  ? ? ?General appearance: alert, cooperative and appears stated age ?Ears: normal TM's and external ear canals both ears ?Throat: lips, mucosa, and tongue normal; teeth and gums normal ?Neck: no adenopathy, no carotid  bruit, supple, symmetrical, trachea midline and thyroid not enlarged, symmetric, no tenderness/mass/nodules ?Back: symmetric, no curvature. ROM normal. No CVA tenderness. ?Lungs: clear to auscultation bilaterally ?Heart: regular rate and rhythm, S1, S2 normal, no murmur, click, rub or gallop ?Abdomen: soft, non-tender; bowel sounds normal; no masses,  no organomegaly ?Pulses: 2+ and symmetric ?Skin: Skin color, texture, turgor normal. No rashes or lesions ?Lymph nodes: Cervical, supraclavicular, and axillary nodes normal. ? ?Lab Results  ?Component Value Date  ?  HGBA1C 7.0 (H) 08/04/2021  ? HGBA1C 5.9 (H) 07/01/2018  ? HGBA1C 5.8 11/26/2017  ? ? ?Lab Results  ?Component Value Date  ? CREATININE 0.71 08/04/2021  ? CREATININE 0.68 04/14/2021  ? CREATININE 0.63 11/26/2020  ? ? ?Lab Results  ?Component Value Date  ? WBC 8.7 08/04/2021  ? HGB 14.1 08/04/2021  ? HCT 42.2 08/04/2021  ? PLT 271.0 08/04/2021  ? GLUCOSE 114 (H) 08/04/2021  ? CHOL 206 (H) 08/04/2021  ? TRIG 99.0 08/04/2021  ? HDL 72.80 08/04/2021  ? LDLDIRECT 141.8 12/14/2012  ? LDLCALC 114 (H) 08/04/2021  ? ALT 30 08/04/2021  ? AST 21 08/04/2021  ? NA 139 08/04/2021  ? K 3.7 08/04/2021  ? CL 102 08/04/2021  ? CREATININE 0.71 08/04/2021  ? BUN 15 08/04/2021  ? CO2 29 08/04/2021  ? TSH 1.18 08/04/2021  ? HGBA1C 7.0 (H) 08/04/2021  ? MICROALBUR <0.7 08/04/2021  ? ? ?MR PELVIS W WO CONTRAST ? ?Result Date: 05/23/2021 ?CLINICAL DATA:  Cystic pelvic mass. EXAM: MRI PELVIS WITHOUT AND WITH CONTRAST TECHNIQUE: Multiplanar multisequence MR imaging of the pelvis was performed both before and after administration of intravenous contrast. CONTRAST:  56m MULTIHANCE GADOBENATE DIMEGLUMINE 529 MG/ML IV SOLN COMPARISON:  Ultrasound on 04/29/2021, and CT on 04/14/2021 FINDINGS: Lower Urinary Tract: No urinary bladder or urethral abnormality identified. Bowel: Mild sigmoid diverticulosis, without evidence of diverticulitis. Vascular/Lymphatic: Unremarkable. No pathologically enlarged pelvic lymph nodes identified. Reproductive: -- Uterus: Measures 8.3 x 3.8 x 4.5 cm (volume = 74 cm^3). A 1.4 cm fibroid is seen in the right anterior uterine corpus. No abnormal endometrial thickening. Cervical nabothian cysts are incidentally noted. -- Right ovary: Not visualized, however no adnexal mass identified. -- Left ovary: A complex cystic lesion is seen in the anterior pelvis which appears to originate from the left adnexa. This measures 9.4 x 7.3 by 8.8 cm. This lesion shows diffuse mural enhancement and nodular thickening, with largest  nodule along the left lateral wall measuring 1.7 cm. This is consistent with a cystic ovarian neoplasm, and malignancy cannot be excluded. Other: No peritoneal thickening or abnormal free fluid. Musculoskeletal:  Unremarkable. IMPRESSION: 9.4 cm complex cystic mass in the anterior pelvis which appears to originate from the left adnexa. This is consistent with a cystic ovarian neoplasm, and malignancy cannot be excluded. No evidence of pelvic metastatic disease. 1.4 cm uterine fibroid. Mild sigmoid diverticulosis, without evidence of diverticulitis. Electronically Signed   By: JMarlaine HindM.D.   On: 05/23/2021 18:42  ? ? ?Assessment & Plan:  ? ?Problem List Items Addressed This Visit   ? ? Hypertension associated with type 2 diabetes mellitus ([place] Primary  ?  Standards of care outlined.  Lifestyle changes advocated.  She declines statin therapy.  She has no proteinuria but is taking losartan for management of hypertension. .   RTC in 3 months for repeat assessment.  ? ?Lab Results  ?Component Value Date  ? HGBA1C 7.0 (H) 08/04/2021  ? ?Lab Results  ?Component Value  Date  ? MICROALBUR <0.7 08/04/2021  ? ? ? ?  ?  ? Relevant Orders  ? Hemoglobin A1c  ? Comprehensive metabolic panel  ? Lipid panel  ? Pneumococcal polysaccharide vaccine 23-valent greater than or equal to 2yo subcutaneous/IM (Completed)  ? ? ?I spent 30 minutes dedicated to the care of this patient on the date of this encounter to include pre-visit review of patient's medical history,  most recent imaging studies, Face-to-face time with the patient , and post visit ordering of testing and therapeutics.   ? ?Follow-up: Return in about 6 months (around 02/27/2022). ? ? ?Crecencio Mc, MD ?

## 2021-08-28 NOTE — Assessment & Plan Note (Addendum)
Standards of care outlined.  Lifestyle changes advocated.  She declines statin therapy.  She has no proteinuria but is taking losartan for management of hypertension. .   RTC in 3 months for repeat assessment.  ? ?Lab Results  ?Component Value Date  ? HGBA1C 7.0 (H) 08/04/2021  ? ?Lab Results  ?Component Value Date  ? MICROALBUR <0.7 08/04/2021  ? ? ? ?

## 2021-09-21 ENCOUNTER — Other Ambulatory Visit: Payer: Self-pay | Admitting: Internal Medicine

## 2021-10-13 DIAGNOSIS — D235 Other benign neoplasm of skin of trunk: Secondary | ICD-10-CM | POA: Diagnosis not present

## 2021-10-13 DIAGNOSIS — L988 Other specified disorders of the skin and subcutaneous tissue: Secondary | ICD-10-CM | POA: Diagnosis not present

## 2021-10-20 ENCOUNTER — Other Ambulatory Visit: Payer: Self-pay | Admitting: Internal Medicine

## 2022-01-21 DIAGNOSIS — R1900 Intra-abdominal and pelvic swelling, mass and lump, unspecified site: Secondary | ICD-10-CM | POA: Diagnosis not present

## 2022-01-26 ENCOUNTER — Telehealth: Payer: Self-pay | Admitting: Internal Medicine

## 2022-01-26 DIAGNOSIS — U071 COVID-19: Secondary | ICD-10-CM | POA: Insufficient documentation

## 2022-01-26 MED ORDER — MOLNUPIRAVIR EUA 200MG CAPSULE: 4.0000 | ORAL_CAPSULE | Freq: Two times a day (BID) | ORAL | 0 refills | Status: AC

## 2022-01-26 NOTE — Telephone Encounter (Signed)
Pt is aware.  

## 2022-01-26 NOTE — Telephone Encounter (Signed)
Antiviral for her and her husband sent to pharmacy

## 2022-01-26 NOTE — Telephone Encounter (Signed)
Patient tested positive for COVID today. She has scratchy throat, head congestion, chills, no temp last night. Patient would like to know if Dr Derrel Nip wants her to take the antiviral medication.

## 2022-01-26 NOTE — Telephone Encounter (Signed)
Spoke with pt and scheduled her for a virtual visit tomorrow at 11:30. Pt wants to know if there is anything that can be done to help prevent her husband from getting it. She stated that she has moved into her office but is concerned because her husband ended up the hospital for 5 days the last time he had covid.

## 2022-01-27 ENCOUNTER — Telehealth (INDEPENDENT_AMBULATORY_CARE_PROVIDER_SITE_OTHER): Payer: BC Managed Care – PPO | Admitting: Internal Medicine

## 2022-01-27 ENCOUNTER — Encounter: Payer: Self-pay | Admitting: Internal Medicine

## 2022-01-27 DIAGNOSIS — U071 COVID-19: Secondary | ICD-10-CM | POA: Diagnosis not present

## 2022-01-27 NOTE — Progress Notes (Signed)
Virtual Visit via [place]   Note    This format is felt to be most appropriate for this patient at this time.  All issues noted in this document were discussed and addressed.  No physical exam was performed (except for noted visual exam findings with Video Visits).   I connected with Dawn Russell on 01/27/22 at 11:30 AM EDT by a video enabled telemedicine application  and verified that I am speaking with the correct person using two identifiers. Location patient: home Location provider: work or home office Persons participating in the virtual visit: patient, provider  I discussed the limitations, risks, security and privacy concerns of performing an evaluation and management service by telephone and the availability of in person appointments. I also discussed with the patient that there may be a patient responsible charge related to this service. The patient expressed understanding and agreed to proceed.  Reason for visit: COVID INFECTION,    HPI:  [name] 56 yr old female with current COVID infection.  She TESTED POSITIVE ON MONDAY  after developing symptoms of cough, congestion body aches  which started on  SUNDAY. STARTED MOLNIUPIRVIR YESTERDAY EVENING . Has quarantined herself in her home office . Denies fevers. Staying hydrated    ROS: See pertinent positives and negatives per HPI.  Past Medical History:  Diagnosis Date   Abnormal Pap smear    ASCUS   Asthma    exercise and cold(weather) induced   Concussion 06/24/2014   improved   COVID-19 05/2019   Dyspareunia    Fibrocystic breast changes    History of kidney stones    History of renal calculi 12/2010   s/p lithotripsy [place]   Hypertension        Ovarian low malignant potential tumor 2005   right salpingo-oophorectomy   Pelvic pain in female    Vaginitis     Past Surgical History:  Procedure Laterality Date   ABDOMINAL HYSTERECTOMY Bilateral 06/2021   CESAREAN SECTION     CHOLECYSTECTOMY     COLONOSCOPY WITH  PROPOFOL N/A 10/11/2015   Procedure: COLONOSCOPY WITH PROPOFOL;  Surgeon: Lucilla Lame, MD;  Location: [facility];  Service: Endoscopy;  Laterality: N/A;  latex sensitivity   COLONOSCOPY WITH PROPOFOL N/A 10/11/2020   Procedure: COLONOSCOPY WITH BIOPSY;  Surgeon: Lucilla Lame, MD;  Location: [facility];  Service: Endoscopy;  Laterality: N/A;  Latex priority 4   EXTRACORPOREAL SHOCK WAVE LITHOTRIPSY Left 04/17/2021   Procedure: LEFT EXTRACORPOREAL SHOCK WAVE LITHOTRIPSY (ESWL);  Surgeon: Remi Haggard, MD;  Location: [facility];  Service: Urology;  Laterality: Left;   OOPHORECTOMY Right 2004   due to large benign cyst,  elevated CA125   PARATHYROIDECTOMY Left 12/02/2020   Procedure: LEFT INFERIOR PARATHYROIDECTOMY;  Surgeon: Armandina Gemma, MD;  Location: [facility];  Service: General;  Laterality: Left;   POLYPECTOMY  10/11/2015   Procedure: POLYPECTOMY;  Surgeon: Lucilla Lame, MD;  Location: [place] [facility];  Service: Endoscopy;;   POLYPECTOMY N/A 10/11/2020   Procedure: POLYPECTOMY;  Surgeon: Lucilla Lame, MD;  Location: [place];  Service: Endoscopy;  Laterality: N/A;    Family History  Problem Relation Age of Onset   Hypertension Father    Stroke Father    Hypertension Mother    Hyperlipidemia Mother    Cancer Sister        brain tumor    SOCIAL HX:  reports that she has never smoked. She has never used smokeless tobacco. She reports current alcohol use.  She reports that she does not use drugs.    Current Outpatient Medications:    albuterol (VENTOLIN HFA) 108 (90 Base) MCG/ACT inhaler, INHALE TWO PUFFS 4 TIMES DAILY AS NEEDED FOR  WHEEZING, Disp: 18 g, Rfl: 0   Azelaic Acid 15 % gel, Apply 1 application. topically 2 (two) times daily., Disp: , Rfl:    cholecalciferol (VITAMIN D) 25 MCG (1000 UNIT) tablet, Take 4,000 Units by mouth daily., Disp: , Rfl:    loratadine (CLARITIN) 10 MG tablet, Take 10 mg by mouth daily., Disp: , Rfl:     losartan (COZAAR) 50 MG tablet, TAKE 1 TABLET DAILY, Disp: 90 tablet, Rfl: 3   molnupiravir EUA (LAGEVRIO) 200 mg CAPS capsule, Take 4 capsules (800 mg total) by mouth 2 (two) times daily for 5 days., Disp: 40 capsule, Rfl: 0   sodium chloride (OCEAN) 0.65 % SOLN nasal spray, Place 1 spray into both nostrils as needed for congestion., Disp: , Rfl:    DOXYCYCLINE HYCLATE PO, Take by mouth. (Patient not taking: Reported on 01/27/2022), Disp: , Rfl:   EXAM:  VITALS per patient if applicable:  GENERAL: alert, oriented, appears well and in no acute distress  HEENT: atraumatic, conjunttiva clear, no obvious abnormalities on inspection of external nose and ears  NECK: normal movements of the head and neck  LUNGS: on inspection no signs of respiratory distress, breathing rate appears normal, no obvious gross SOB, gasping or wheezing  CV: no obvious cyanosis  MS: moves all visible extremities without noticeable abnormality  PSYCH/NEURO: pleasant and cooperative, no obvious depression or anxiety, speech and thought processing grossly intact  ASSESSMENT AND PLAN:  Discussed the following assessment and plan:  COVID-19 virus infection  COVID-19 virus infection CONTINUE MOLNUPIRAVIR AND QUARANTINE FROM HUSBAND UNTIL 5 days from positive test.  Supportive care to prevent bacterial sinusitis outlined.     I discussed the assessment and treatment plan with the patient. The patient was provided an opportunity to ask questions and all were answered. The patient agreed with the plan and demonstrated an understanding of the instructions.   The patient was advised to call back or seek an in-person evaluation if the symptoms worsen or if the condition fails to improve as anticipated.   I spent 20 minutes dedicated to the care of this patient on the date of this encounter to include pre-visit review of his medical history,  Face-to-face time with the patient , and post visit ordering of testing and  therapeutics.    Crecencio Mc, MD

## 2022-01-27 NOTE — Assessment & Plan Note (Signed)
CONTINUE MOLNUPIRAVIR AND QUARANTINE FROM HUSBAND UNTIL 5 days from positive test.  Supportive care to prevent bacterial sinusitis outlined.

## 2022-02-27 ENCOUNTER — Ambulatory Visit: Payer: BC Managed Care – PPO | Admitting: Internal Medicine

## 2022-03-18 ENCOUNTER — Ambulatory Visit (INDEPENDENT_AMBULATORY_CARE_PROVIDER_SITE_OTHER): Payer: BC Managed Care – PPO | Admitting: Internal Medicine

## 2022-03-18 ENCOUNTER — Encounter: Payer: Self-pay | Admitting: Internal Medicine

## 2022-03-18 VITALS — BP 124/76 | HR 78 | Temp 97.6°F | Ht 67.0 in | Wt 167.4 lb

## 2022-03-18 DIAGNOSIS — I152 Hypertension secondary to endocrine disorders: Secondary | ICD-10-CM | POA: Diagnosis not present

## 2022-03-18 DIAGNOSIS — C562 Malignant neoplasm of left ovary: Secondary | ICD-10-CM | POA: Diagnosis not present

## 2022-03-18 DIAGNOSIS — E785 Hyperlipidemia, unspecified: Secondary | ICD-10-CM | POA: Diagnosis not present

## 2022-03-18 DIAGNOSIS — R202 Paresthesia of skin: Secondary | ICD-10-CM

## 2022-03-18 DIAGNOSIS — E210 Primary hyperparathyroidism: Secondary | ICD-10-CM

## 2022-03-18 DIAGNOSIS — E1159 Type 2 diabetes mellitus with other circulatory complications: Secondary | ICD-10-CM | POA: Diagnosis not present

## 2022-03-18 DIAGNOSIS — R200 Anesthesia of skin: Secondary | ICD-10-CM

## 2022-03-18 NOTE — Patient Instructions (Signed)
Treatment choices and follow up will depend on your A1c

## 2022-03-18 NOTE — Progress Notes (Signed)
Subjective:  Patient ID: Dawn Russell, female    DOB: Apr 27, 1966  Age: 56 y.o. MRN: [id number]  CC: The primary encounter diagnosis was Primary hyperparathyroidism ([place]). 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   HPI Dawn Russell presents for follow up on diet controlled Type 2 DM and HTN Chief Complaint  Patient presents with   Follow-up    6 month follow up on diabetes and hypertension  %  1) Type 2 DM:    following a low gi diet 75% of the time.  Avoiding bread and pasta.  Being moderate with sweets .  Avoids red meat due to GI issues.   2) Stress:  mother has Alzheimers and is in A/L facility  in [place]. For the past 2 years .  Also stress at work,    3) HTN:   taking losartan 50 mg daily . Not checking BP at home.   4) vitamin d deficiency:  taking 4000 Ius of D3 daily. Had endocrine    Outpatient Medications Prior to Visit  Medication Sig Dispense Refill   albuterol (VENTOLIN HFA) 108 (90 Base) MCG/ACT inhaler INHALE TWO PUFFS 4 TIMES DAILY AS NEEDED FOR  WHEEZING 18 g 0   Azelaic Acid 15 % gel Apply 1 application. topically 2 (two) times daily.     cholecalciferol (VITAMIN D) 25 MCG (1000 UNIT) tablet Take 4,000 Units by mouth daily.     loratadine (CLARITIN) 10 MG tablet Take 10 mg by mouth daily.     losartan (COZAAR) 50 MG tablet TAKE 1 TABLET DAILY 90 tablet 3   sodium chloride (OCEAN) 0.65 % SOLN nasal spray Place 1 spray into both nostrils as needed for congestion.     DOXYCYCLINE HYCLATE PO Take by mouth. (Patient not taking: Reported on 01/27/2022)     No facility-administered medications prior to visit.    Review of Systems;  Patient denies headache, fevers, malaise, unintentional weight loss, skin rash, eye pain, sinus congestion and sinus pain, sore throat,  dysphagia,  hemoptysis , cough, dyspnea, wheezing, chest pain, palpitations, orthopnea, edema, abdominal pain, nausea, melena, diarrhea, constipation, flank pain, dysuria, hematuria, urinary  Frequency, nocturia, numbness, tingling, seizures,  Focal weakness, Loss of consciousness,  Tremor, insomnia, depression, anxiety, and suicidal ideation.      Objective:  BP 124/76 (BP Location: Left Arm, Patient Position: Sitting, Cuff Size: Normal)   Pulse 78   Temp 97.6 F (36.4 C) (Oral)   Ht '5\' 7"'$  (1.702 m)   Wt 167 lb 6.4 oz (75.9 kg)   SpO2 97%   BMI 26.22 kg/m   BP Readings from Last 3 Encounters:  03/18/22 124/76  01/27/22 138/82  08/27/21 124/82    Wt Readings from Last 3 Encounters:  03/18/22 167 lb 6.4 oz (75.9 kg)  01/27/22 170 lb (77.1 kg)  08/27/21 169 lb 9.6 oz (76.9 kg)    General appearance: alert, cooperative and appears stated age Ears: normal TM's and external ear canals both ears Throat: lips, mucosa, and tongue normal; teeth and gums normal Neck: no adenopathy, no carotid bruit, supple, symmetrical, trachea midline and thyroid not enlarged, symmetric, no tenderness/mass/nodules Back: symmetric, no curvature. ROM normal. No CVA tenderness. Lungs: clear to auscultation bilaterally Heart: regular rate and rhythm, S1, S2 normal, no murmur, click, rub or gallop Abdomen: soft, non-tender; bowel  sounds normal; no masses,  no organomegaly Pulses: 2+ and symmetric Skin: Skin color, texture, turgor normal. No rashes or lesions Lymph nodes: Cervical, supraclavicular, and axillary nodes normal. Neuro:  awake and interactive with normal mood and affect. Higher cortical functions are normal. Speech is clear without word-finding difficulty or dysarthria. Extraocular movements are intact. Visual fields of both eyes are grossly intact. Sensation to light touch is grossly intact bilaterally of upper and lower extremities. Motor examination shows 4+/5 symmetric hand grip and upper  extremity and 5/5 lower extremity strength. There is no pronation or drift. Gait is non-ataxic   Lab Results  Component Value Date   HGBA1C 6.5 03/18/2022   HGBA1C 7.0 (H) 08/04/2021   HGBA1C 5.9 (H) 07/01/2018    Lab Results  Component Value Date   CREATININE 0.65 03/18/2022   CREATININE 0.71 08/04/2021   CREATININE 0.68 04/14/2021    Lab Results  Component Value Date   WBC 8.7 08/04/2021   HGB 14.1 08/04/2021   HCT 42.2 08/04/2021   PLT 271.0 08/04/2021   GLUCOSE 104 (H) 03/18/2022   CHOL 188 03/18/2022   TRIG 102.0 03/18/2022   HDL 70.60 03/18/2022   LDLDIRECT 106.0 03/18/2022   LDLCALC 97 03/18/2022   ALT 44 (H) 03/18/2022   AST 25 03/18/2022   NA 140 03/18/2022   K 3.7 03/18/2022   CL 103 03/18/2022   CREATININE 0.65 03/18/2022   BUN 21 03/18/2022   CO2 28 03/18/2022   TSH 0.75 03/18/2022   HGBA1C 6.5 03/18/2022   MICROALBUR <0.7 08/04/2021    MR PELVIS W WO CONTRAST  Result Date: 05/23/2021 CLINICAL DATA:  Cystic pelvic mass. EXAM: MRI PELVIS WITHOUT AND WITH CONTRAST TECHNIQUE: Multiplanar multisequence MR imaging of the pelvis was performed both before and after administration of intravenous contrast. CONTRAST:  38m MULTIHANCE GADOBENATE DIMEGLUMINE 529 MG/ML IV SOLN COMPARISON:  Ultrasound on 04/29/2021, and CT on 04/14/2021 FINDINGS: Lower Urinary Tract: No urinary bladder or urethral abnormality identified. Bowel: Mild sigmoid diverticulosis, without evidence of diverticulitis. Vascular/Lymphatic: Unremarkable. No pathologically enlarged pelvic lymph nodes identified. Reproductive: -- Uterus: Measures 8.3 x 3.8 x 4.5 cm (volume = 74 cm^3). A 1.4 cm fibroid is seen in the right anterior uterine corpus. No abnormal endometrial thickening. Cervical nabothian cysts are incidentally noted. -- Right ovary: Not visualized, however no adnexal mass identified. -- Left ovary: A complex cystic lesion is seen in the anterior pelvis which appears to originate from the left  adnexa. This measures 9.4 x 7.3 by 8.8 cm. This lesion shows diffuse mural enhancement and nodular thickening, with largest nodule along the left lateral wall measuring 1.7 cm. This is consistent with a cystic ovarian neoplasm, and malignancy cannot be excluded. Other: No peritoneal thickening or abnormal free fluid. Musculoskeletal:  Unremarkable. IMPRESSION: 9.4 cm complex cystic mass in the anterior pelvis which appears to originate from the left adnexa. This is consistent with a cystic ovarian neoplasm, and malignancy cannot be excluded. No evidence of pelvic metastatic disease. 1.4 cm uterine fibroid. Mild sigmoid diverticulosis, without evidence of diverticulitis. Electronically Signed   By: JMarlaine HindM.D.   On: 05/23/2021 18:42    Assessment & Plan:   Problem List Items Addressed This Visit     Hypertension associated with type 2 diabetes mellitus ([place]    Improving control with low GI diet and exercise .  hemoglobin A1c is at goal.  . Patient is referred for  eye exam and foot exam was done today.  There  is  no proteinuria on prior micro urinalysis . LDL is over 70 and statin therapy will be advised,  .  HTN is well controlled on current regimen. Renal function stable, no changes today.  Lab Results  Component Value Date   HGBA1C 6.5 03/18/2022    Lab Results  Component Value Date   CREATININE 0.65 03/18/2022   Lab Results  Component Value Date   NA 140 03/18/2022   K 3.7 03/18/2022   CL 103 03/18/2022   CO2 28 03/18/2022   Lab Results  Component Value Date   MICROALBUR <0.7 08/04/2021    Lab Results  Component Value Date   CHOL 188 03/18/2022   HDL 70.60 03/18/2022   LDLCALC 97 03/18/2022   LDLDIRECT 106.0 03/18/2022   TRIG 102.0 03/18/2022   CHOLHDL 3 03/18/2022         Primary hyperparathyroidism ([place]) - Primary   Relevant Orders   TSH (Completed)   Primary malignant neoplasm of ovary [facility])    Reviewed her recent follow up visit in August 2023 by [place] for a surveillance visit s/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  Advised to avoid HRT and continue cancer surveillance.       Other Visit Diagnoses     Hyperlipidemia, unspecified hyperlipidemia type       Relevant Orders   LDL cholesterol, direct (Completed)   Numbness and tingling of both feet       Relevant Orders   B12 and Folate Panel (Completed)       I spent a total of  30 minutes with this patient in a face to face visit on the date of this encounter reviewing the last office visit with me in  June,   patient's diet and exercise habits,  counselling on exercise and diabetes management ,    and post visit ordering of testing and therapeutics.    Follow-up: Return in about 3 months (around 06/18/2022).   Crecencio Mc, MD

## 2022-03-18 NOTE — Assessment & Plan Note (Signed)
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  Advised to avoid HRT and continue cancer surveillance.

## 2022-03-19 LAB — COMPREHENSIVE METABOLIC PANEL
ALT: 44 U/L — ABNORMAL HIGH (ref 0–35)
AST: 25 U/L (ref 0–37)
Albumin: 4.6 g/dL (ref 3.5–5.2)
Alkaline Phosphatase: 108 U/L (ref 39–117)
BUN: 21 mg/dL (ref 6–23)
CO2: 28 mEq/L (ref 19–32)
Calcium: 9.7 mg/dL (ref 8.4–10.5)
Chloride: 103 mEq/L (ref 96–112)
Creatinine, Ser: 0.65 mg/dL (ref 0.40–1.20)
GFR: 97.9 mL/min (ref >60.00)
Glucose, Bld: 104 mg/dL — ABNORMAL HIGH (ref 70–99)
Potassium: 3.7 mEq/L (ref 3.5–5.1)
Sodium: 140 mEq/L (ref 135–145)
Total Bilirubin: 0.4 mg/dL (ref 0.2–1.2)
Total Protein: 7.3 g/dL (ref 6.0–8.3)

## 2022-03-19 LAB — B12 AND FOLATE PANEL
Folate: 13.9 ng/mL (ref >5.9)
Vitamin B-12: 226 pg/mL (ref 211–911)

## 2022-03-19 LAB — TSH: TSH: 0.75 u[IU]/mL (ref 0.35–5.50)

## 2022-03-19 LAB — LIPID PANEL
Cholesterol: 188 mg/dL (ref 0–200)
HDL: 70.6 mg/dL (ref >39.00)
LDL Cholesterol: 97 mg/dL (ref 0–99)
NonHDL: 117.8
Total CHOL/HDL Ratio: 3
Triglycerides: 102 mg/dL (ref 0.0–149.0)
VLDL: 20.4 mg/dL (ref 0.0–40.0)

## 2022-03-19 LAB — HEMOGLOBIN A1C: Hgb A1c MFr Bld: 6.5 % (ref 4.6–6.5)

## 2022-03-19 LAB — LDL CHOLESTEROL, DIRECT: Direct LDL: 106 mg/dL

## 2022-03-19 NOTE — Assessment & Plan Note (Addendum)
Improving control with low GI diet and exercise .  hemoglobin A1c is at goal.  . Patient is referred for  eye exam and foot exam was done today.  There is  no proteinuria on prior micro urinalysis . LDL is over 70 and statin therapy will be advised,  .  HTN is well controlled on current regimen. Renal function stable, no changes today.  Lab Results  Component Value Date   HGBA1C 6.5 03/18/2022    Lab Results  Component Value Date   CREATININE 0.65 03/18/2022   Lab Results  Component Value Date   NA 140 03/18/2022   K 3.7 03/18/2022   CL 103 03/18/2022   CO2 28 03/18/2022   Lab Results  Component Value Date   MICROALBUR <0.7 08/04/2021    Lab Results  Component Value Date   CHOL 188 03/18/2022   HDL 70.60 03/18/2022   LDLCALC 97 03/18/2022   LDLDIRECT 106.0 03/18/2022   TRIG 102.0 03/18/2022   CHOLHDL 3 03/18/2022

## 2022-04-09 ENCOUNTER — Ambulatory Visit: Payer: BC Managed Care – PPO | Admitting: Internal Medicine

## 2022-04-15 DIAGNOSIS — L718 Other rosacea: Secondary | ICD-10-CM | POA: Diagnosis not present

## 2022-04-15 DIAGNOSIS — D225 Melanocytic nevi of trunk: Secondary | ICD-10-CM | POA: Diagnosis not present

## 2022-04-15 DIAGNOSIS — Z86018 Personal history of other benign neoplasm: Secondary | ICD-10-CM | POA: Diagnosis not present

## 2022-04-15 DIAGNOSIS — D485 Neoplasm of uncertain behavior of skin: Secondary | ICD-10-CM | POA: Diagnosis not present

## 2022-04-15 DIAGNOSIS — L578 Other skin changes due to chronic exposure to nonionizing radiation: Secondary | ICD-10-CM | POA: Diagnosis not present

## 2022-04-15 DIAGNOSIS — B351 Tinea unguium: Secondary | ICD-10-CM | POA: Diagnosis not present

## 2022-04-21 ENCOUNTER — Ambulatory Visit (INDEPENDENT_AMBULATORY_CARE_PROVIDER_SITE_OTHER): Payer: BC Managed Care – PPO

## 2022-04-21 DIAGNOSIS — Z23 Encounter for immunization: Secondary | ICD-10-CM

## 2022-05-05 DIAGNOSIS — Z01419 Encounter for gynecological examination (general) (routine) without abnormal findings: Secondary | ICD-10-CM | POA: Diagnosis not present

## 2022-05-05 DIAGNOSIS — Z1272 Encounter for screening for malignant neoplasm of vagina: Secondary | ICD-10-CM | POA: Diagnosis not present

## 2022-05-06 DIAGNOSIS — D235 Other benign neoplasm of skin of trunk: Secondary | ICD-10-CM | POA: Diagnosis not present

## 2022-05-20 NOTE — Progress Notes (Signed)
Patient ID: Dawn Russell, female   DOB: 1965-09-08, 56 y.o.   MRN: [id number]   HPI  Dawn Russell is a 56 y.o.-year-old female, initially referred by her PCP, Dr. Derrel Nip, returning for follow-up for primary hyperparathyroidism and vitamin D deficiency.  Last visit 1 year ago.  Interim history: She feels well after the surgery, without any complaints. She has increased stress with her mother being sick, now in memory care unit in [place].  Reviewedhistory: She had her first kidney stone in 2013 (had to have lithotripsy) and had another kidney stone recently (passed it and was analyzed by urology).  At that time, she was told that the stone was made of calcium and she also had a 24-hour urine collection showing a high urinary calcium. She is seeing Dr. Diona Fanti.  She was started on indapamide before our last visit  - to reduce kidney stone incidence.  A 24-hour urine collection checked after she started on indapamide showed an improved calcium level.   To be able to further investigated her parathyroid status, we stopped indapamide.  Repeat calcium and PTH were normal.  No new kidney stones, however, she tells me that she does have 1 in the left kidney, unchanged. She changed her diet: No red meat, decreased salt.  She had an elevated calcium in 06/2018 and then again in 04/21.  PTH level was not suppressed initially, but at last check, it was appropriate for calcium in the upper limit of normal.  We checked a Tc sestamibi parathyroid scan (09/17/2020) and this showed a left inferior parathyroid adenoma.   I referred her to surgery and Dr. Harlow Asa checked a thyroid ultrasound (11/28/2020): Normal thyroid, 2 nodules inferior to right thyroid (0.6 x 0.6 x 0.4 cm) and inferior to left thyroid (0.7 x 0.7 x 0.4 cm), most compatible with parathyroid glands.  She had left inferior parathyroidectomy by Dr. Harlow Asa 12/02/2020: 0.7 x 0.4 x 0.4 cm, 0.085 g parathyroid adenoma resected.  Calcium and PTH  after surgery (01/2021): 9.6 and 46, respectively.  Reviewed pertinent labs: Lab Results  Component Value Date   PTH 34 04/08/2021   PTH Comment 04/08/2021   PTH 27 10/03/2020   PTH 19 03/29/2020   PTH Comment 03/29/2020   PTH 36 09/15/2019   PTH Comment 09/15/2019   PTH 64 03/22/2019   PTH 29 01/13/2019   PTH 71 (H) 09/13/2018   CALCIUM 9.7 03/18/2022   CALCIUM 10.3 08/04/2021   CALCIUM 9.8 04/14/2021   CALCIUM 9.9 04/08/2021   CALCIUM 9.5 11/26/2020   CALCIUM 10.3 10/03/2020   CALCIUM 10.1 03/29/2020   CALCIUM 10.5 (H) 09/15/2019   CALCIUM 9.7 03/22/2019   CALCIUM 10.0 01/13/2019   Component     Latest Ref Rng & Units 09/13/2018  Calcium Ionized     4.8 - 5.6 mg/dL 5.33   11/30/2018: Reviewed records from Dr. Diona Fanti: Patient had elevated urine calcium in 24-hour urine collection (08/04/2018) at 452.  After starting HCTZ, it  decreased to 234 (11/02/2018)  Her calcium, phosphorus, magnesium, were normal, while her calcitriol level was slightly elevated: Component     Latest Ref Rng & Units 03/22/2019  Calcium     8.6 - 10.4 mg/dL 9.7  Phosphorus     2.5 - 4.5 mg/dL 3.0  Magnesium     1.5 - 2.5 mg/dL 2.0  PTH, Intact     14 - 64 pg/mL 64   Vitamin D 1, 25 (OH) Total  18 - 72 pg/mL 83 (H)  Vitamin D3 1, 25 (OH)     pg/mL 83  Vitamin D2 1, 25 (OH)     pg/mL <8   No history of osteoporosis, fractures, falls.   We obtained a DXA scan: This showed mild osteopenia:  09/03/2020 - [place] Lumbar spine L1-L4 Femoral neck (FN) 33% distal radius  T-score -0.4 RFN: -1.0 LFN: -1.1 -0.8   She has a history of kidney stones x2: 05/2018: [facility] analysis: Carbonate apatite (calcium phosphate) (90%) calcium oxalate (10%) 09/08/2017: CT abdomen showing nonobstructive left nephrolithiasis.  She is drinking lemonade from [employer] when she suspects she has a kidney stone and this helps.  No CKD. Last BUN/Cr: Lab Results  Component Value Date   BUN 21 03/18/2022   BUN  15 08/04/2021   CREATININE 0.65 03/18/2022   CREATININE 0.71 08/04/2021   She is not on HCTZ or indapamide.  She is trying to follow a low-sodium diet.  She has a history of vitamin D deficiency. Previously on ergocalciferol, then on oral vitamin D supplement, increased from 2000 to 4000 units daily - she now continues on this dose.  Reviewed previous vitamin D levels: Lab Results  Component Value Date   VD25OH 53.7 04/08/2021   VD25OH 37.0 10/03/2020   VD25OH 25.88 (L) 03/29/2020   VD25OH 25.74 (L) 09/15/2019   VD25OH 32.90 11/15/2018   VD25OH 21.06 (L) 11/26/2016   VD25OH 12.72 (L) 06/15/2016   Pt does not have a FH of hypercalcemia, pituitary tumors, thyroid cancer, or osteoporosis.  Mother had partial thyroidectomy (? Dx).  She lost 30 lbs in 09/03/2016 - [employer].  In the past, we discussed about a plant-based diet and I gave her references.  She is again interested in getting more information about the plant-based diet.   1 of her sisters developed a brain tumor at 82 years old and died from it at 57 years old. Another sister died of lung cancer in 2019/09/04.   ROS: + see HPI  I reviewed pt's medications, allergies, PMH, social hx, family hx, and changes were documented in the history of present illness. Otherwise, unchanged from my initial visit note.  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 salpingo-oophorectomy   Pelvic pain in female    Vaginitis    Past Surgical History:  Procedure Laterality Date   ABDOMINAL HYSTERECTOMY Bilateral 06/2021   CESAREAN SECTION     CHOLECYSTECTOMY     COLONOSCOPY WITH PROPOFOL N/A 10/11/2015    Procedure: COLONOSCOPY WITH PROPOFOL;  Surgeon: Lucilla Lame, MD;  Location: [place];  Service: Endoscopy;  Laterality: N/A;  latex sensitivity   COLONOSCOPY WITH PROPOFOL N/A 10/11/2020   Procedure: COLONOSCOPY WITH BIOPSY;  Surgeon: Lucilla Lame, MD;  Location: [place];  Service: Endoscopy;  Laterality: N/A;  Latex priority 4   EXTRACORPOREAL SHOCK WAVE LITHOTRIPSY Left 04/17/2021   Procedure: LEFT EXTRACORPOREAL SHOCK WAVE LITHOTRIPSY (ESWL);  Surgeon: Remi Haggard, MD;  [facility];  Service: Urology;  Laterality: Left;  OOPHORECTOMY Right 2004   due to large benign cyst,  elevated CA125   PARATHYROIDECTOMY Left 12/02/2020   Procedure: LEFT INFERIOR PARATHYROIDECTOMY;  Surgeon: Armandina Gemma, MD;  Location: [facility];  Service: General;  Laterality: Left;   POLYPECTOMY  10/11/2015   Procedure: POLYPECTOMY;  Surgeon: Lucilla Lame, MD;  Location: [place];  Service: Endoscopy;;   POLYPECTOMY N/A 10/11/2020   Procedure: POLYPECTOMY;  Surgeon: Lucilla Lame, MD;  Location: [place];  Service: Endoscopy;  Laterality: N/A;   Social History   Socioeconomic History   Marital status: Married    Spouse name: Not on file   Number of children: 2   Years of education: Not on file   Highest education level: Not on file  Occupational History    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  Lifestyle   Physical activity:    Days per week: Not on file    Minutes per session: Not on file   Stress: Not on file   Relationships   Social connections:    Talks on phone: Not on file    Gets together: Not on file    Attends religious service: Not on file    Active member of club or organization: Not on file    Attends meetings of clubs or organizations: Not on file    Relationship status: Not on file   Intimate partner violence:    Fear of current or ex partner: Not on file    Emotionally abused: Not on file    Physically abused: Not on file    Forced sexual activity: Not on file  Other Topics Concern   Not on file  Social History Narrative   Married    Current Outpatient Medications on File Prior to Visit  Medication Sig Dispense Refill   albuterol (VENTOLIN HFA) 108 (90 Base) MCG/ACT inhaler INHALE TWO PUFFS 4 TIMES DAILY AS NEEDED FOR  WHEEZING 18 g 0   Azelaic Acid 15 % gel Apply 1 application. topically 2 (two) times daily.     cholecalciferol (VITAMIN D) 25 MCG (1000 UNIT) tablet Take 4,000 Units by mouth daily.     loratadine (CLARITIN) 10 MG tablet Take 10 mg by mouth daily.     losartan (COZAAR) 50 MG tablet TAKE 1 TABLET DAILY 90 tablet 3   sodium chloride (OCEAN) 0.65 % SOLN nasal spray Place 1 spray into both nostrils as needed for congestion.     No current facility-administered medications on file prior to visit.   Allergies  Allergen Reactions   Amoxicillin Rash   Latex Rash    Burning after internal ultrasound   Monistat [Miconazole] Rash    Itching and burning   Septra [Bactrim] Rash   Sulfa Antibiotics Rash   Sulfamethoxazole-Trimethoprim Rash   Family History  Problem Relation Age of Onset   Hypertension Father    Stroke Father    Hypertension Mother    Hyperlipidemia Mother    Cancer  Sister        brain tumor   PE: BP 120/78 (BP Location: Right Arm, Patient Position: Sitting, Cuff Size: Normal)   Pulse 93   Ht '5\' 7"'$  (1.702 m)   Wt 167 lb 9.6 oz (76 kg)   SpO2 99%   BMI 26.25 kg/m  Wt Readings from Last 3 Encounters:  05/21/22 167 lb 9.6 oz (76 kg)   03/18/22 167 lb 6.4 oz (75.9 kg)  01/27/22 170 lb (77.1 kg)   Constitutional: Slightly overweight, in NAD Eyes:  EOMI, no exophthalmos ENT: no neck masses, no cervical lymphadenopathy Cardiovascular: RRR, No MRG Respiratory: CTA B Musculoskeletal: no deformities Skin:no rashes Neurological: no tremor with outstretched hands  Assessment: 1. Hypercalcemia/hyperparathyroidism  2.  Vitamin D deficiency  Plan: Patient with history of elevated calcium with the highest level being 10.8.  Intact PTH was slightly high, also, at 71, corresponding to a calcium in the normal range, at 9.8.  She was also found to have an elevated 24-hour urine calcium in the urologist office and she was started on indapamide.  To further investigate her hyperparathyroidism, we stopped indapamide in 11/2018.  At further visits, PTH was lower, at 19, for a normal calcium level of 10.1.  Vitamin D level was still slightly low at that time and we increased the supplement dose, after which her vitamin D level normalized.  Phosphorus, magnesium, and calcitriol levels were normal.  Her DXA scan showed osteopenia.  She does have a history of kidney stones x2 and her 24-hour urine calcium was elevated, >400 mg/d. -a technetium sestamibi scan (09/17/2020) showed a left inferior parathyroid adenoma -I referred her to surgery and she had a neck ultrasound (11/28/2020) showing no thyroid nodules but 2 visible parathyroid glands: Right inferior and left inferior -She had left inferior parathyroidectomy on 12/02/2020 -Calcium and PTH levels normalized.  And they were still normal at last visit.  Since then, she had another calcium level checked on 03/18/2022 and this was normal, at 9.7. -Final pathology showed a nodule weighing 0.085 g and measuring 0.7 x 0.4 x 0.4 cm, compatible with an adenoma -She feels good after the surgery -At last visit the right corner of her scar had a little induration-we discussed about possibly using  kelo-cote -At today's visit, we will recheck her calcium, PTH, and vitamin D - I will advise her to continue to follow with PCP with annual calcium and vitamin D levels  2.  Vitamin D deficiency -She continues on 4000 units vitamin D daily -Vitamin D level was normal at last visit: Lab Results  Component Value Date   VD25OH 53.7 04/08/2021  -We will recheck the level today   Component     Latest Ref Rng 05/21/2022  Calcium     8.7 - 10.2 mg/dL 10.0   PTH, Intact     15 - 65 pg/mL 26   VITD     30.00 - 100.00 ng/mL 62.10    Normal labs.  Philemon Kingdom, MD PhD [facility]

## 2022-05-21 ENCOUNTER — Encounter: Payer: Self-pay | Admitting: Internal Medicine

## 2022-05-21 ENCOUNTER — Ambulatory Visit: Payer: BC Managed Care – PPO | Admitting: Internal Medicine

## 2022-05-21 VITALS — BP 120/78 | HR 93 | Ht 67.0 in | Wt 167.6 lb

## 2022-05-21 DIAGNOSIS — E559 Vitamin D deficiency, unspecified: Secondary | ICD-10-CM

## 2022-05-21 DIAGNOSIS — E210 Primary hyperparathyroidism: Secondary | ICD-10-CM

## 2022-05-21 LAB — VITAMIN D 25 HYDROXY (VIT D DEFICIENCY, FRACTURES): VITD: 62.1 ng/mL (ref 30.00–100.00)

## 2022-05-21 NOTE — Patient Instructions (Addendum)
Please stop at the lab.  Please continue vitamin D 4000 units daily.  Read the following books: Dr. Alyssa Grove - Program for Reversing Diabetes Rip Cyd Silence - The Engine 2 diet Dr. Alyssa Grove [facility] over [place] documentary  Please return to see me as needed - if calcium increases in the future.

## 2022-05-22 LAB — PTH, INTACT AND CALCIUM
Calcium: 10 mg/dL (ref 8.7–10.2)
PTH: 26 pg/mL (ref 15–65)

## 2022-05-26 ENCOUNTER — Encounter: Payer: Self-pay | Admitting: Adult Health

## 2022-05-26 ENCOUNTER — Telehealth (INDEPENDENT_AMBULATORY_CARE_PROVIDER_SITE_OTHER): Payer: BC Managed Care – PPO | Admitting: Adult Health

## 2022-05-26 VITALS — BP 127/67 | HR 112 | Temp 101.3°F | Ht 67.0 in | Wt 165.0 lb

## 2022-05-26 DIAGNOSIS — J0140 Acute pansinusitis, unspecified: Secondary | ICD-10-CM

## 2022-05-26 MED ORDER — DOXYCYCLINE HYCLATE 100 MG PO CAPS: 100.0000 mg | ORAL_CAPSULE | Freq: Two times a day (BID) | ORAL | 0 refills | Status: AC

## 2022-05-26 NOTE — Progress Notes (Signed)
Virtual Visit via Video Note  I connected with Dawn Russell on 05/26/22 at  3:30 PM EST by a video enabled telemedicine application and verified that I am speaking with the correct person using two identifiers.  Location patient: home Location provider:work or home office Persons participating in the virtual visit: patient, provider  I discussed the limitations of evaluation and management by telemedicine and the availability of in person appointments. The patient expressed understanding and agreed to proceed.   HPI: 56 year old female who is being evaluated today for an acute issue.  Her symptoms started this week.  Symptoms include a semiproductive cough, fever up to 101.3, headache, body aches, heavy sensation in her sinuses and body aches.  Take a COVID test at home yesterday which was negative.  She has been using Mucinex DM and saline nasal spray as well as taking Tylenol to help alleviate fever.  Her husband was diagnosed with a sinus infection earlier today    ROS: See pertinent positives and negatives per HPI.  Past Medical History:  Diagnosis Date   Abnormal Pap smear    ASCUS   Asthma    exercise and cold(weather) induced   Concussion 06/24/2014   improved   COVID-19 05/2019   Dyspareunia    Fibrocystic breast changes    History of kidney stones    History of renal calculi 12/2010   s/p lithotripsy D[place]t   Hypertension        Ovarian low malignant potential tumor 2005   right salpingo-oophorectomy   Pelvic pain in female    Vaginitis     Past Surgical History:  Procedure Laterality Date   ABDOMINAL HYSTERECTOMY Bilateral 06/2021   CESAREAN SECTION     CHOLECYSTECTOMY     COLONOSCOPY WITH PROPOFOL N/A 10/11/2015   Procedure: COLONOSCOPY WITH PROPOFOL;  Surgeon: Lucilla Lame, MD;  Location: [place];  Service: Endoscopy;  Laterality: N/A;  latex sensitivity   COLONOSCOPY WITH PROPOFOL N/A 10/11/2020   Procedure: COLONOSCOPY WITH BIOPSY;   Surgeon: Lucilla Lame, MD;  Location: [place];  Service: Endoscopy;  Laterality: N/A;  Latex priority 4   EXTRACORPOREAL SHOCK WAVE LITHOTRIPSY Left 04/17/2021   Procedure: LEFT EXTRACORPOREAL SHOCK WAVE LITHOTRIPSY (ESWL);  Surgeon: Remi Haggard, MD;  [facility];  Service: Urology;  Laterality: Left;   OOPHORECTOMY Right 2004   due to large benign cyst,  elevated CA125   PARATHYROIDECTOMY Left 12/02/2020   Procedure: LEFT INFERIOR PARATHYROIDECTOMY;  Surgeon: Armandina Gemma, MD;  Location: [facility];  Service: General;  Laterality: Left;   POLYPECTOMY  10/11/2015   Procedure: POLYPECTOMY;  Surgeon: Lucilla Lame, MD;  Location: [place];  Service: Endoscopy;;   POLYPECTOMY N/A 10/11/2020   Procedure: POLYPECTOMY;  Surgeon: Lucilla Lame, MD;  Location: [place];  Service: Endoscopy;  Laterality: N/A;    Family History  Problem Relation Age of Onset   Hypertension Father    Stroke Father    Hypertension Mother    Hyperlipidemia Mother    Cancer Sister        brain tumor       Current Outpatient Medications:    albuterol (VENTOLIN HFA) 108 (90 Base) MCG/ACT inhaler, INHALE TWO PUFFS 4 TIMES DAILY AS NEEDED FOR  WHEEZING, Disp: 18 g, Rfl: 0   Azelaic Acid 15 % gel, Apply 1 application. topically 2 (two) times daily., Disp: , Rfl:    cholecalciferol (VITAMIN D) 25 MCG (1000 UNIT) tablet, Take 4,000 Units by  mouth daily., Disp: , Rfl:    doxycycline (VIBRAMYCIN) 100 MG capsule, Take 1 capsule (100 mg total) by mouth 2 (two) times daily., Disp: 14 capsule, Rfl: 0   loratadine (CLARITIN) 10 MG tablet, Take 10 mg by mouth daily., Disp: , Rfl:    losartan (COZAAR) 50 MG tablet, TAKE 1 TABLET DAILY, Disp: 90 tablet, Rfl: 3   sodium chloride (OCEAN) 0.65 % SOLN nasal spray, Place 1 spray into both nostrils as needed for congestion., Disp: , Rfl:   EXAM:  VITALS per patient if applicable:  GENERAL: alert, oriented, appears ill but   in no acute distress  HEENT: atraumatic, conjunttiva clear, no obvious abnormalities on inspection of external nose and ears  NECK: normal movements of the head and neck  LUNGS: on inspection no signs of respiratory distress, breathing rate appears normal, no obvious gross SOB, gasping or wheezing  CV: no obvious cyanosis  MS: moves all visible extremities without noticeable abnormality  PSYCH/NEURO: pleasant and cooperative, no obvious depression or anxiety, speech and thought processing grossly intact  ASSESSMENT AND PLAN:  Discussed the following assessment and plan:  1. Acute non-recurrent pansinusitis - will treat d/t symptoms. Advised follow up with PCP if no improvement in the next 3-5 days  - doxycycline (VIBRAMYCIN) 100 MG capsule; Take 1 capsule (100 mg total) by mouth 2 (two) times daily.  Dispense: 14 capsule; Refill: 0     I discussed the assessment and treatment plan with the patient. The patient was provided an opportunity to ask questions and all were answered. The patient agreed with the plan and demonstrated an understanding of the instructions.   The patient was advised to call back or seek an in-person evaluation if the symptoms worsen or if the condition fails to improve as anticipated.   Dorothyann Peng, NP

## 2022-05-29 ENCOUNTER — Ambulatory Visit (INDEPENDENT_AMBULATORY_CARE_PROVIDER_SITE_OTHER): Payer: BC Managed Care – PPO | Admitting: Family Medicine

## 2022-05-29 ENCOUNTER — Encounter: Payer: Self-pay | Admitting: Family Medicine

## 2022-05-29 VITALS — BP 128/68 | HR 85 | Temp 97.7°F | Ht 67.0 in | Wt 166.4 lb

## 2022-05-29 DIAGNOSIS — J069 Acute upper respiratory infection, unspecified: Secondary | ICD-10-CM

## 2022-05-29 DIAGNOSIS — R509 Fever, unspecified: Secondary | ICD-10-CM | POA: Diagnosis not present

## 2022-05-29 LAB — POC COVID19 BINAXNOW: SARS Coronavirus 2 Ag: NEGATIVE

## 2022-05-29 LAB — POCT INFLUENZA A/B
Influenza A, POC: NEGATIVE
Influenza B, POC: NEGATIVE

## 2022-05-29 NOTE — Assessment & Plan Note (Addendum)
Symptoms for 5d  Taking doxy for sinusitis  (rev her virtual visit) Still some fever but gradually coming down Reassuring exam Neg flu and covid testing today  Disc sympt care, may try to add flonase Handout given ER precautions noted  Update if not starting to improve in a week or if worsening

## 2022-05-29 NOTE — Progress Notes (Signed)
Subjective:    Patient ID: Dawn Russell, female    DOB: 1966/04/24, 56 y.o.   MRN: [id number]  HPI Pt presents with sinus symptoms  56 yo pt of Dr Joycelyn Schmid Readings from Last 3 Encounters:  05/29/22 166 lb 6 oz (75.5 kg)  05/26/22 165 lb (74.8 kg)  05/21/22 167 lb 9.6 oz (76 kg)   26.06 kg/m   She had a virt visit on 12/19  Dx with sinusitis and tx with doxy  Home covid test neg early on   Symptoms  Still has nasal congestion  Temp 101.3 tues , 100.3 after that  Sneezing for several day  Coughing - yellow phlegm now  Thought she had the flu  Body aches are starting to improve  No wheezing or sob    Did get a flu shot this year    Had to leave work yesterday   Is off next week and still has to work some   Otc:  Mucinex DM  Tylenol and motrin  Afrin -once  Saline ns   Results for orders placed or performed in visit on 05/29/22  POC COVID-19  Result Value Ref Range   SARS Coronavirus 2 Ag Negative Negative  POCT Influenza A/B  Result Value Ref Range   Influenza A, POC Negative Negative   Influenza B, POC Negative Negative    Patient Active Problem List   Diagnosis Date Noted   COVID-19 virus infection 01/26/2022   Osteoporosis 12/01/2020   History of nephrolithiasis 12/01/2020   History of colonic polyps    Polyp of colon 01/31/2020   Increased frequency of urination 11/20/2019   Primary malignant neoplasm of ovary ([place]) 11/20/2019   Menopausal symptom 11/20/2019   Eczema 01/31/2019   Hypercalcemia 01/31/2019   Sensorineural hearing loss 01/31/2019   Primary hyperparathyroidism ([place]) 09/18/2018   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/2018   Vitamin D deficiency 06/17/2016   Tubular adenoma of colon    Benign neoplasm of sigmoid colon    Low back pain without sciatica 11/30/2012   Viral URI with cough 04/08/2012   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   Overweight (BMI 25.0-29.9) 12/01/2011   Asthma 07/23/2011   Screening for breast cancer  03/30/2011   Screening for cervical cancer 03/30/2011   Calcium oxalate renal calculi    Hypertension associated with type 2 diabetes mellitus ([place])    Past Medical History:  Diagnosis Date   Abnormal Pap smear    ASCUS   Asthma    exercise and cold(weather) induced   Concussion 06/24/2014   improved   COVID-19 05/2019   Dyspareunia    Fibrocystic breast changes    History of kidney stones    History of renal calculi 12/2010   s/p lithotripsy [place]   Hypertension        Ovarian low malignant potential tumor 2005   right salpingo-oophorectomy   Pelvic pain in female    Vaginitis    Past Surgical History:  Procedure Laterality Date   ABDOMINAL HYSTERECTOMY Bilateral 06/2021   CESAREAN SECTION     CHOLECYSTECTOMY     COLONOSCOPY WITH PROPOFOL N/A 10/11/2015   Procedure: COLONOSCOPY WITH PROPOFOL;  Surgeon: Lucilla Lame, MD;  Location: [facility];  Service: Endoscopy;  Laterality: N/A;  latex sensitivity   COLONOSCOPY WITH PROPOFOL N/A 10/11/2020   Procedure: COLONOSCOPY WITH BIOPSY;  Surgeon: Lucilla Lame, MD;  Location: [facility];  Service: Endoscopy;  Laterality: N/A;  Latex priority 4  EXTRACORPOREAL SHOCK WAVE LITHOTRIPSY Left 04/17/2021   Procedure: LEFT EXTRACORPOREAL SHOCK WAVE LITHOTRIPSY (ESWL);  Surgeon: Remi Haggard, MD;  Location: [facility];  Service: Urology;  Laterality: Left;   OOPHORECTOMY Right 2004   due to large benign cyst,  elevated CA125   PARATHYROIDECTOMY Left 12/02/2020   Procedure: LEFT INFERIOR PARATHYROIDECTOMY;  Surgeon: Armandina Gemma, MD;  Location: [facility];  Service: General;  Laterality: Left;   POLYPECTOMY  10/11/2015   Procedure: POLYPECTOMY;  Surgeon: Lucilla Lame, MD;  Location: [facility];  Service: Endoscopy;;   POLYPECTOMY N/A 10/11/2020   Procedure: POLYPECTOMY;  Surgeon: Lucilla Lame, MD;  Location: [place];  Service: Endoscopy;  Laterality: N/A;   Social History    Tobacco Use   Smoking status: Never   Smokeless tobacco: Never  Vaping Use   Vaping Use: Never used  Substance Use Topics   Alcohol use: Yes    Comment: rare   Drug use: Never   Family History  Problem Relation Age of Onset   Hypertension Father    Stroke Father    Hypertension Mother    Hyperlipidemia Mother    Cancer Sister        brain tumor   Allergies  Allergen Reactions   Amoxicillin Rash   Latex Rash    Burning after internal ultrasound   Monistat [Miconazole] Rash    Itching and burning   Septra [Bactrim] Rash   Sulfa Antibiotics Rash   Sulfamethoxazole-Trimethoprim Rash   Current Outpatient Medications on File Prior to Visit  Medication Sig Dispense Refill   albuterol (VENTOLIN HFA) 108 (90 Base) MCG/ACT inhaler INHALE TWO PUFFS 4 TIMES DAILY AS NEEDED FOR  WHEEZING 18 g 0   Azelaic Acid 15 % gel Apply 1 application. topically 2 (two) times daily.     cholecalciferol (VITAMIN D) 25 MCG (1000 UNIT) tablet Take 4,000 Units by mouth daily.     doxycycline (VIBRAMYCIN) 100 MG capsule Take 1 capsule (100 mg total) by mouth 2 (two) times daily. 14 capsule 0   loratadine (CLARITIN) 10 MG tablet Take 10 mg by mouth daily.     losartan (COZAAR) 50 MG tablet TAKE 1 TABLET DAILY 90 tablet 3   sodium chloride (OCEAN) 0.65 % SOLN nasal spray Place 1 spray into both nostrils as needed for congestion.     No current facility-administered medications on file prior to visit.    Review of Systems  Constitutional:  Positive for appetite change and fatigue. Negative for fever.  HENT:  Positive for congestion, postnasal drip, rhinorrhea, sinus pressure, sneezing and sore throat. Negative for ear pain.   Eyes:  Negative for pain and discharge.  Respiratory:  Positive for cough. Negative for shortness of breath, wheezing and stridor.   Cardiovascular:  Negative for chest pain.  Gastrointestinal:  Negative for diarrhea, nausea and vomiting.  Genitourinary:  Negative for  frequency, hematuria and urgency.  Musculoskeletal:  Negative for arthralgias and myalgias.  Skin:  Negative for rash.  Neurological:  Positive for headaches. Negative for dizziness, weakness and light-headedness.  Psychiatric/Behavioral:  Negative for confusion and dysphoric mood.        Objective:   Physical Exam Constitutional:      General: She is not in acute distress.    Appearance: Normal appearance. She is well-developed and normal weight. She is not ill-appearing, toxic-appearing or diaphoretic.  HENT:     Head: Normocephalic and atraumatic.     Comments: Nares are injected and  congested    No facial tenderness    Right Ear: Tympanic membrane, ear canal and external ear normal.     Left Ear: Tympanic membrane, ear canal and external ear normal.     Nose: Congestion and rhinorrhea present.     Mouth/Throat:     Mouth: Mucous membranes are moist.     Pharynx: Oropharynx is clear. No oropharyngeal exudate or posterior oropharyngeal erythema.     Comments: Clear pnd  Eyes:     General:        Right eye: No discharge.        Left eye: No discharge.     Conjunctiva/sclera: Conjunctivae normal.     Pupils: Pupils are equal, round, and reactive to light.  Cardiovascular:     Rate and Rhythm: Normal rate.     Heart sounds: Normal heart sounds.  Pulmonary:     Effort: Pulmonary effort is normal. No respiratory distress.     Breath sounds: Normal breath sounds. No stridor. No wheezing, rhonchi or rales.  Chest:     Chest wall: No tenderness.  Musculoskeletal:     Cervical back: Normal range of motion and neck supple.  Lymphadenopathy:     Cervical: No cervical adenopathy.  Skin:    General: Skin is warm and dry.     Capillary Refill: Capillary refill takes less than 2 seconds.     Findings: No rash.  Neurological:     Mental Status: She is alert.     Cranial Nerves: No cranial nerve deficit.  Psychiatric:        Mood and Affect: Mood normal.            Assessment & Plan:   Problem List Items Addressed This Visit       Respiratory   Viral URI with cough    Symptoms for 5d  Taking doxy for sinusitis  (rev her virtual visit) Still some fever but gradually coming down Reassuring exam Neg flu and covid testing today  Disc sympt care, may try to add flonase Handout given ER precautions noted  Update if not starting to improve in a week or if worsening        Other Visit Diagnoses     Fever, unspecified fever cause    -  Primary   Relevant Orders   POC COVID-19 (Completed)   POCT Influenza A/B (Completed)

## 2022-05-29 NOTE — Patient Instructions (Addendum)
Drink fluids and rest  mucinex DM is good for cough and congestion  Nasal saline for congestion as needed  Tylenol and /or motrin for fever or pain or headache  Please alert [place] if symptoms worsen (if severe or short of breath please go to the ER)   You can get flonase nasal spray over the counter once daily for a week or more   Finish your doxycycline    Update if not starting to improve in a week or if worsening

## 2022-06-18 ENCOUNTER — Encounter: Payer: Self-pay | Admitting: Internal Medicine

## 2022-06-18 ENCOUNTER — Ambulatory Visit: Payer: BC Managed Care – PPO | Admitting: Internal Medicine

## 2022-06-18 VITALS — BP 122/72 | HR 91 | Temp 97.9°F | Ht 67.0 in | Wt 165.8 lb

## 2022-06-18 DIAGNOSIS — E210 Primary hyperparathyroidism: Secondary | ICD-10-CM | POA: Diagnosis not present

## 2022-06-18 DIAGNOSIS — E785 Hyperlipidemia, unspecified: Secondary | ICD-10-CM

## 2022-06-18 DIAGNOSIS — E559 Vitamin D deficiency, unspecified: Secondary | ICD-10-CM

## 2022-06-18 DIAGNOSIS — E1159 Type 2 diabetes mellitus with other circulatory complications: Secondary | ICD-10-CM

## 2022-06-18 DIAGNOSIS — I152 Hypertension secondary to endocrine disorders: Secondary | ICD-10-CM

## 2022-06-18 DIAGNOSIS — N200 Calculus of kidney: Secondary | ICD-10-CM

## 2022-06-18 NOTE — Progress Notes (Signed)
Subjective:  Patient ID: Dawn Russell, female    DOB: 06/22/1965  Age: 57 y.o. MRN: [id number]  CC: The primary encounter diagnosis was Hypertension associated with type 2 diabetes mellitus ([place]). Diagnoses of Hyperlipidemia, unspecified hyperlipidemia type, Primary hyperparathyroidism ([place]), Vitamin D deficiency, and Calcium oxalate renal calculi were also pertinent to this visit.   HPI Chonte Ricke Ballew presents for  Chief Complaint  Patient presents with   Medical Management of Chronic Issues    3 month follow up on diabetes   1_ Type 2 DM:  Se feels generally well, is exercising several times per week and checking blood sugars once daily at variable times.  BS have been under 130 fasting and < 150 post prandially.  Denies any recent hypoglyemic events.  Taking his medications as directed. Following a carbohydrate modified diet 6 days per week. Denies numbness, burning and tingling of extremities. Appetite is good.     2) HTN:  Patient is taking her medications as prescribed and notes no adverse effects.  Home BP readings have been done about once per week and are  generally < 130/80 .  She is avoiding added salt in her diet and walking regularly about 3 times per week for exercise  .   Outpatient Medications Prior to Visit  Medication Sig Dispense Refill   albuterol (VENTOLIN HFA) 108 (90 Base) MCG/ACT inhaler INHALE TWO PUFFS 4 TIMES DAILY AS NEEDED FOR  WHEEZING 18 g 0   Azelaic Acid 15 % gel Apply 1 application. topically 2 (two) times daily.     cholecalciferol (VITAMIN D) 25 MCG (1000 UNIT) tablet Take 4,000 Units by mouth daily.     doxycycline (VIBRAMYCIN) 100 MG capsule Take 1 capsule (100 mg total) by mouth 2 (two) times daily. 14 capsule 0   Efinaconazole (JUBLIA) 10 % SOLN      loratadine (CLARITIN) 10 MG tablet Take 10 mg by mouth daily.     losartan (COZAAR) 50 MG tablet TAKE 1 TABLET DAILY 90 tablet 3   sodium chloride (OCEAN) 0.65 % SOLN nasal spray Place 1 spray into  both nostrils as needed for congestion.     No facility-administered medications prior to visit.    Review of Systems;  Patient denies headache, fevers, malaise, unintentional weight loss, skin rash, eye pain, sinus congestion and sinus pain, sore throat, dysphagia,  hemoptysis , cough, dyspnea, wheezing, chest pain, palpitations, orthopnea, edema, abdominal pain, nausea, melena, diarrhea, constipation, flank pain, dysuria, hematuria, urinary  Frequency, nocturia, numbness, tingling, seizures,  Focal weakness, Loss of consciousness,  Tremor, insomnia, depression, anxiety, and suicidal ideation.      Objective:  BP 122/72   Pulse 91   Temp 97.9 F (36.6 C) (Oral)   Ht '5\' 7"'$  (1.702 m)   Wt 165 lb 12.8 oz (75.2 kg)   SpO2 99%   BMI 25.97 kg/m   BP Readings from Last 3 Encounters:  06/18/22 122/72  05/29/22 128/68  05/26/22 127/67    Wt Readings from Last 3 Encounters:  06/18/22 165 lb 12.8 oz (75.2 kg)  05/29/22 166 lb 6 oz (75.5 kg)  05/26/22 165 lb (74.8 kg)    Physical Exam Vitals reviewed.  Constitutional:      General: She is not in acute distress.    Appearance: Normal appearance. She is normal weight. She is not ill-appearing, toxic-appearing or diaphoretic.  HENT:     Head: Normocephalic.  Eyes:     General: No scleral icterus.  Right eye: No discharge.        Left eye: No discharge.     Conjunctiva/sclera: Conjunctivae normal.  Cardiovascular:     Rate and Rhythm: Normal rate and regular rhythm.     Heart sounds: Normal heart sounds.  Pulmonary:     Effort: Pulmonary effort is normal. No respiratory distress.     Breath sounds: Normal breath sounds.  Musculoskeletal:        General: Normal range of motion.  Skin:    General: Skin is warm and dry.  Neurological:     General: No focal deficit present.     Mental Status: She is alert and oriented to person, place, and time. Mental status is at baseline.  Psychiatric:        Mood and Affect: Mood  normal.        Behavior: Behavior normal.        Thought Content: Thought content normal.        Judgment: Judgment normal.     Lab Results  Component Value Date   HGBA1C 6.5 03/18/2022   HGBA1C 7.0 (H) 08/04/2021   HGBA1C 5.9 (H) 07/01/2018    Lab Results  Component Value Date   CREATININE 0.65 03/18/2022   CREATININE 0.71 08/04/2021   CREATININE 0.68 04/14/2021    Lab Results  Component Value Date   WBC 8.7 08/04/2021   HGB 14.1 08/04/2021   HCT 42.2 08/04/2021   PLT 271.0 08/04/2021   GLUCOSE 104 (H) 03/18/2022   CHOL 188 03/18/2022   TRIG 102.0 03/18/2022   HDL 70.60 03/18/2022   LDLDIRECT 106.0 03/18/2022   LDLCALC 97 03/18/2022   ALT 44 (H) 03/18/2022   AST 25 03/18/2022   NA 140 03/18/2022   K 3.7 03/18/2022   CL 103 03/18/2022   CREATININE 0.65 03/18/2022   BUN 21 03/18/2022   CO2 28 03/18/2022   TSH 0.75 03/18/2022   HGBA1C 6.5 03/18/2022   MICROALBUR <0.7 08/04/2021    Assessment & Plan:  .Hypertension associated with type 2 diabetes mellitus ([place]) Assessment & Plan: Her diabetes and hypertension are well controlled on current regimen f losartan  Renal function stable, no changes today.  Statin therapy advised by deferred by patient .     Orders: -     Comprehensive metabolic panel; Future -     Hemoglobin A1c; Future -     Microalbumin / creatinine urine ratio; Future  Hyperlipidemia, unspecified hyperlipidemia type -     LDL cholesterol, direct; Future -     Lipid panel; Future  Primary hyperparathyroidism [facility]) Assessment & Plan: She had left inferior parathyroidectomy by Dr. Harlow Asa 12/02/2020: 0.7 x 0.4 x 0.4 cm, 0.085 g parathyroid adenoma resected. Post operative calcium level was normal.  She  svoiding thiazide diuretics  taking 4000 Ius of D3 and will continue annual calcium and PTH checkes with me.    Vitamin D deficiency Assessment & Plan: Level was 12. Drisdol prescribed x 4 months  and she is now taking 4000 Ius  daily    Calcium oxalate renal calculi Assessment & Plan: Continue current regimen of water supplemented with citric acid       Follow-up: Return in about 3 months (around 09/17/2022) for follow up diabetes.   Crecencio Mc, MD

## 2022-06-18 NOTE — Patient Instructions (Addendum)
Your cholesterol is actually fine, but patients with diabetes have a higher risk of heart attack and stroke,  and statins (lipitor , crestor etc) have been studied and shown to reduce the risk of these events.    The natural remedies for cholesterol have not been proven to reduce your risk for a heart attack.  Red Yeast Rice has not been proven either,  But does lower cholesterol, so if you want to try it , the dose is 600 mg twice daily in capsule form, available OTC.    Let's have you return in 3 months for labs and follow up

## 2022-06-20 NOTE — Assessment & Plan Note (Addendum)
Her diabetes and hypertension are well controlled on current regimen f losartan  Renal function stable, no changes today.  Statin therapy advised by deferred by patient .

## 2022-06-20 NOTE — Assessment & Plan Note (Addendum)
Continue current regimen of water supplemented with citric acid

## 2022-06-20 NOTE — Assessment & Plan Note (Signed)
Level was 12. Drisdol prescribed x 4 months  and she is now taking 4000 Ius daily

## 2022-06-20 NOTE — Assessment & Plan Note (Addendum)
She had left inferior parathyroidectomy by Dr. Harlow Asa 12/02/2020: 0.7 x 0.4 x 0.4 cm, 0.085 g parathyroid adenoma resected. Post operative calcium level was normal.  She  svoiding thiazide diuretics  taking 4000 Ius of D3 and will continue annual calcium and PTH checkes with me.

## 2022-06-26 IMAGING — MR MR PELVIS WO/W CM
12 of 18 series · 30 of 48 positions shown · IV contrast (16 ml multihance)
Comparison: Ultrasound on 04/29/2021, and CT on 04/14/2021

CLINICAL DATA: Cystic pelvic mass.

EXAM:
MRI PELVIS WITHOUT AND WITH CONTRAST
TECHNIQUE: Multiplanar multisequence MR imaging of the pelvis was performed
both before and after administration of intravenous contrast.
CONTRAST:  16mL MULTIHANCE GADOBENATE DIMEGLUMINE 529 MG/ML IV SOLN

[Series 3: T2 · coronal · 6.0mm · 1.56mm/px · 1 of 20 slices shown (1 of 4)]
[im 1/20]
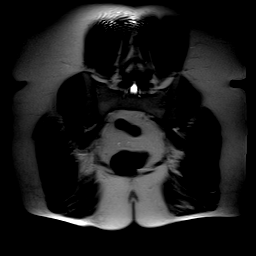

[Series 4: T2 · axial · 5.0mm · 0.94mm/px · 1 of 30 slices shown (2 of 4)]
[im 1/30]
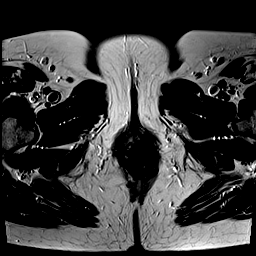

[Series 5: T2 fat-sat · axial · 5.0mm · 0.94mm/px · 1 of 30 slices shown]
[im 1/30]
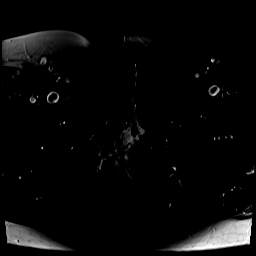

[Series 6: T2 · sagittal · 5.0mm · 0.94mm/px · 1 of 25 slices shown (3 of 4)]
[im 1/25]
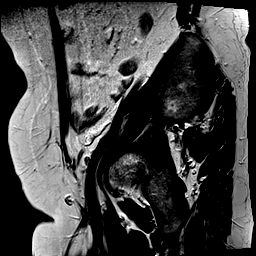

[Series 7: T2 · coronal · 3.0mm · 0.81mm/px · 3 of 35 slices shown (4 of 4)]
[im 1/35]
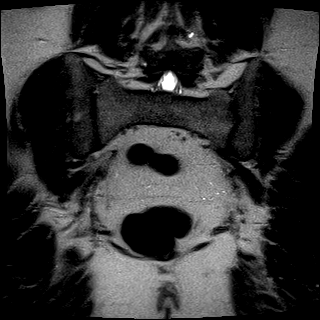
[im 18/35]
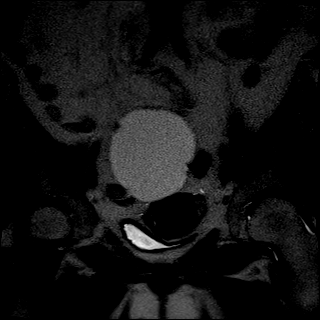
[im 35/35]
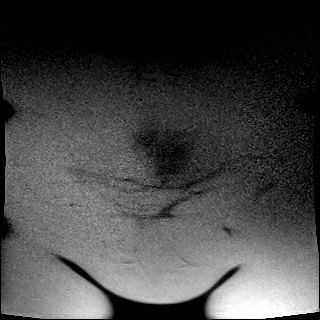

[Series 8: DWI · axial · 5.0mm · 1.72mm/px · z∈[-21,+129]mm · 6 of 78 slices shown]
[im 1/78]
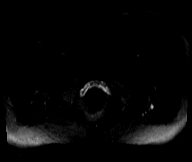
[im 16/78]
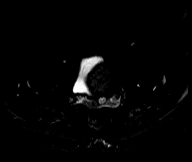
[im 31/78]
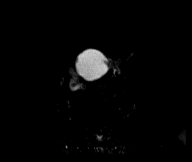
[im 47/78]
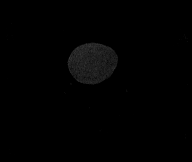
[im 62/78]
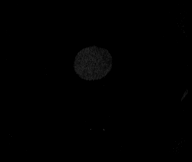
[im 78/78]
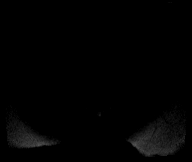

[Series 9: axial dwi_adc · axial · 5.0mm · 1.72mm/px · z∈[-21,+129]mm · 2 of 26 slices shown]
[im 1/26]
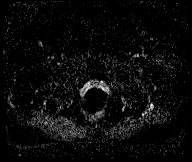
[im 26/26]
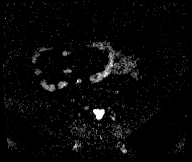

[Series 10: axial in out · axial · 5.5mm · 0.74mm/px · z∈[-44,+139]mm · 4 of 60 slices shown]
[im 1/60]
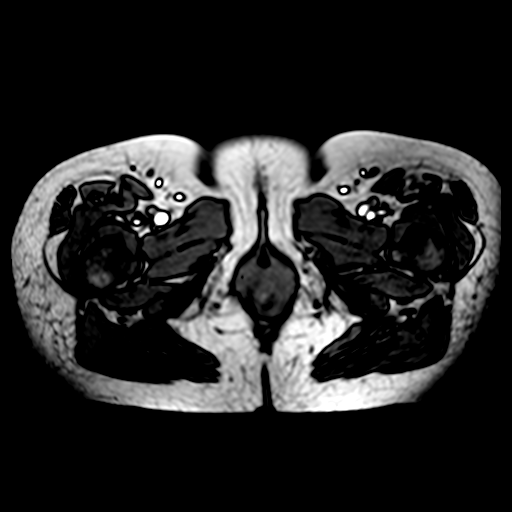
[im 20/60]
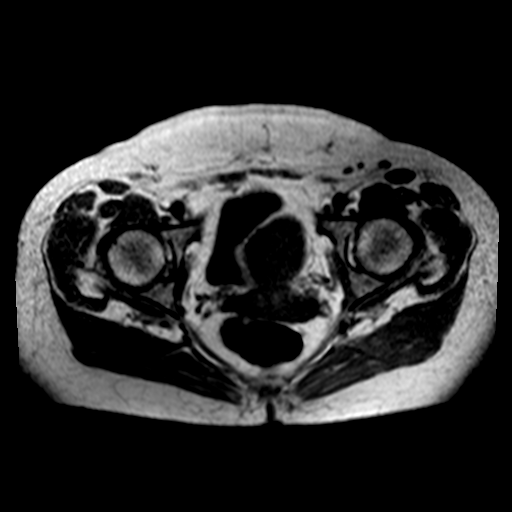
[im 40/60]
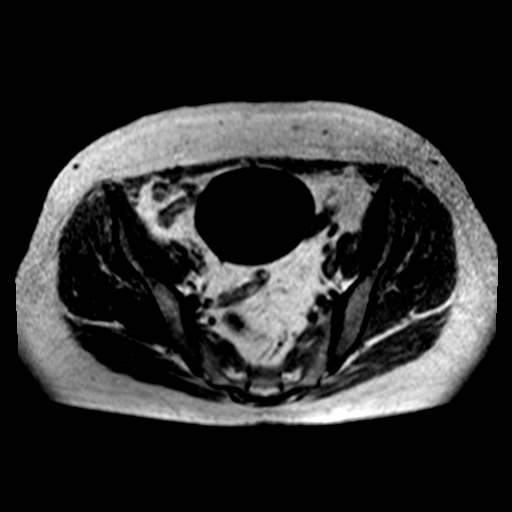
[im 60/60]
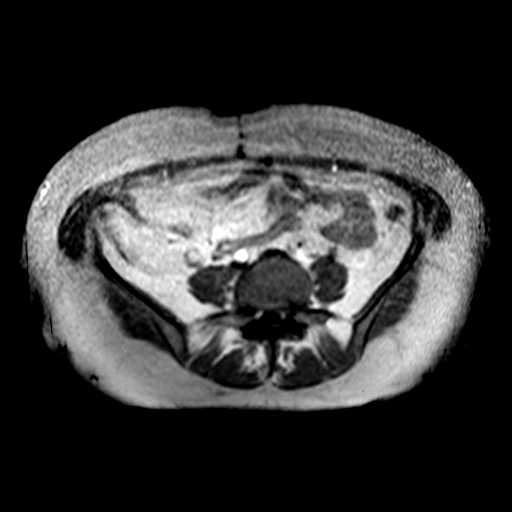

[Series 11: T1 dynamic · axial · non-contrast · 4.0mm · 0.49mm/px · z∈[-17,+123]mm · 3 of 36 slices shown (1 of 2)]
[im 1/36]
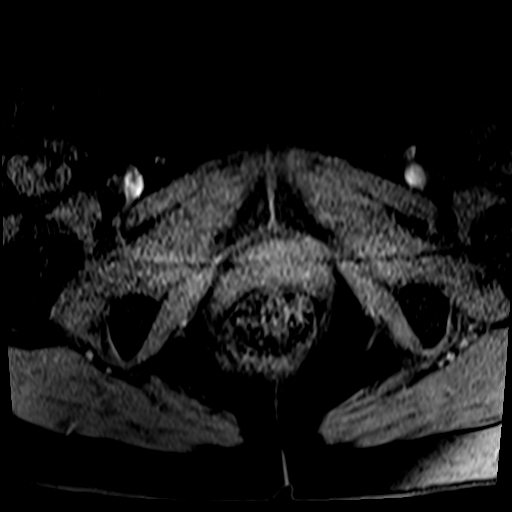
[im 18/36]
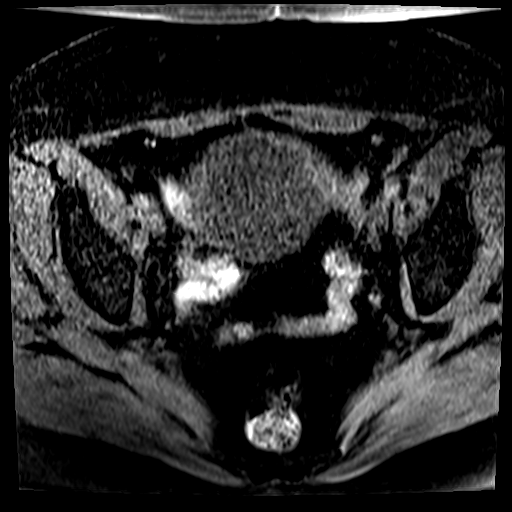
[im 36/36]
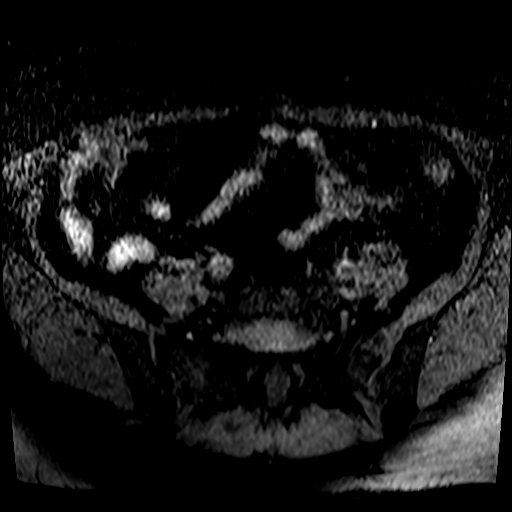

[Series 12: T1 dynamic post-contrast · axial · 4.0mm · 0.49mm/px · z∈[-17,+123]mm · 3 of 36 slices shown (1 of 2)]
[im 1/36]
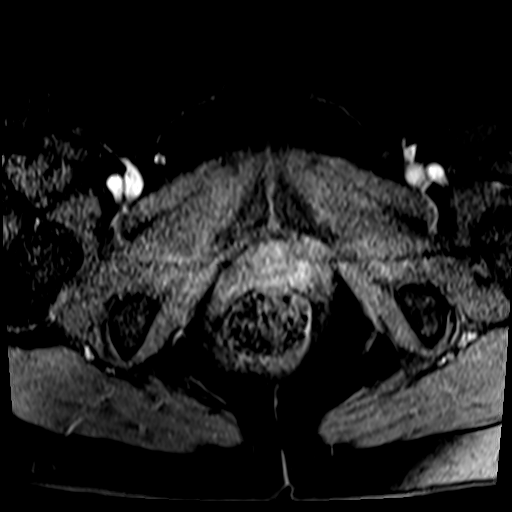
[im 18/36]
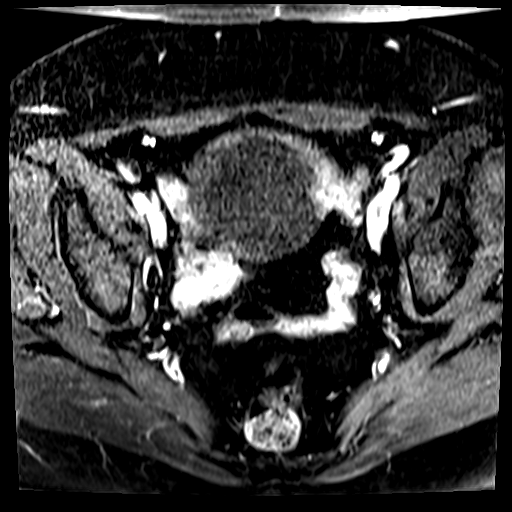
[im 36/36]
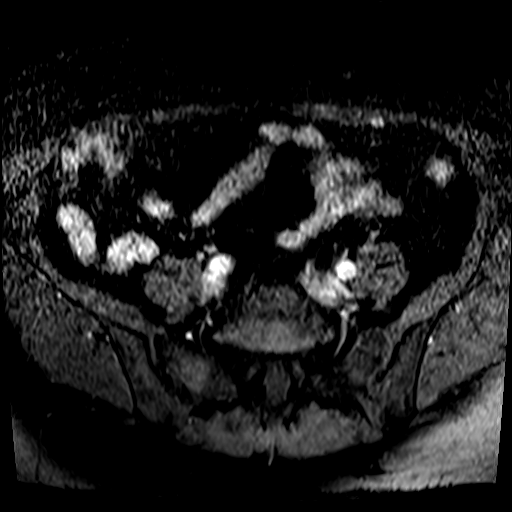

[Series 13: T1 dynamic · axial · 4.0mm · 0.49mm/px · z∈[-17,+123]mm · 3 of 36 slices shown (2 of 2)]
[im 1/36]
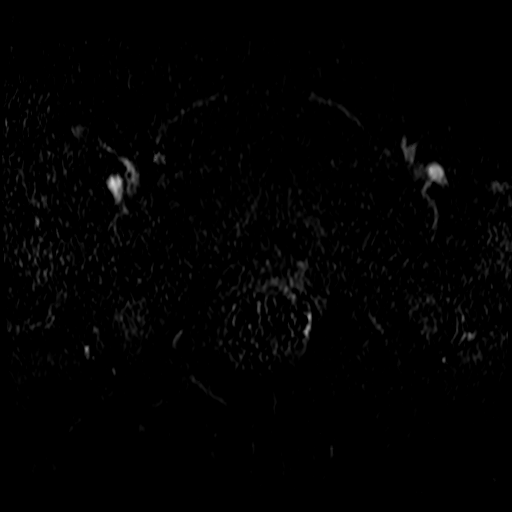
[im 18/36]
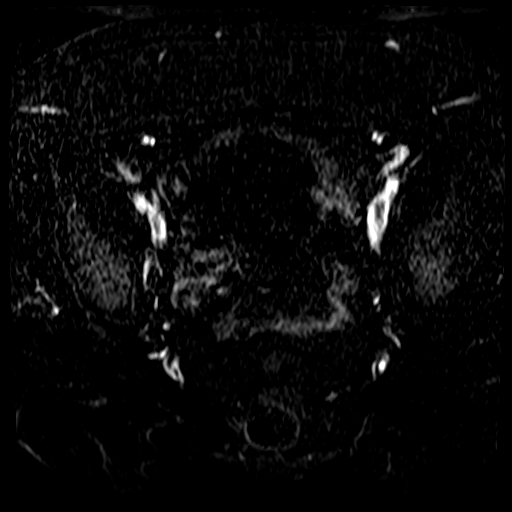
[im 36/36]
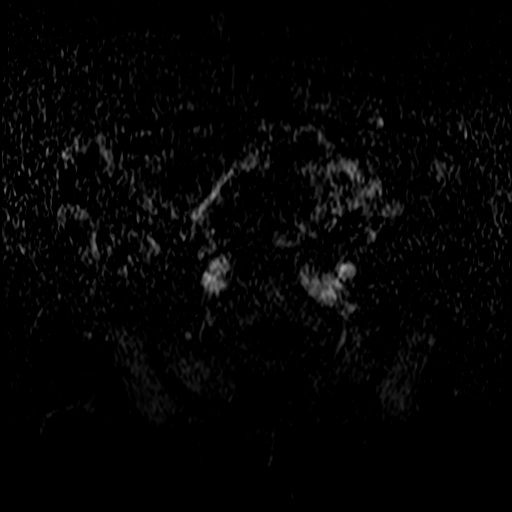

[Series 14: T1 dynamic post-contrast · axial · 4.0mm · 0.49mm/px · z∈[-17,+51]mm · 2 of 36 slices shown (2 of 2)]
[im 1/36]
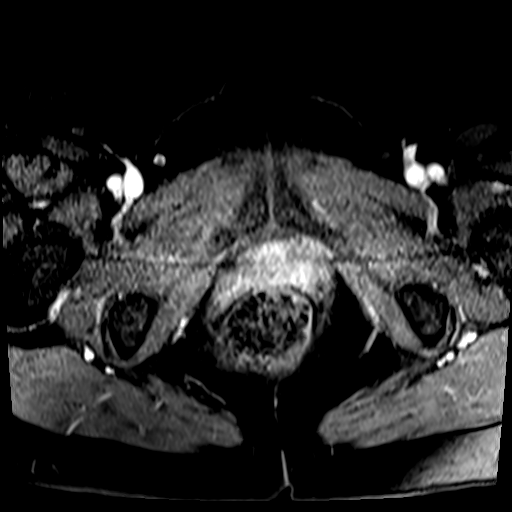
[im 18/36]
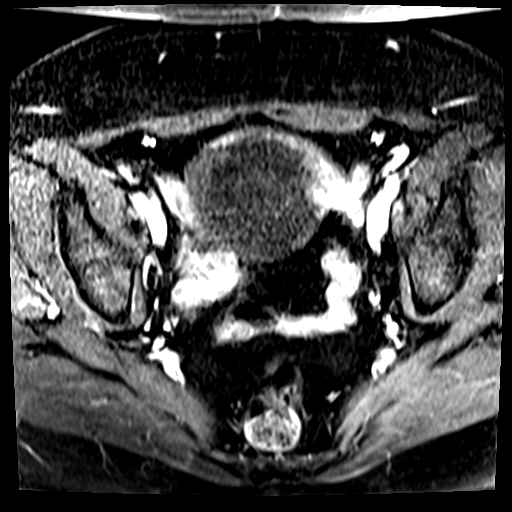

[30 of 48 positions shown; findings below may reference images not displayed]

FINDINGS: Lower Urinary Tract: No urinary bladder or urethral abnormality
identified.

Bowel: Mild sigmoid diverticulosis, without evidence of
diverticulitis.

Vascular/Lymphatic: Unremarkable. No pathologically enlarged pelvic
lymph nodes identified.

Reproductive:

-- Uterus: Measures 8.3 x 3.8 x 4.5 cm (volume = 74 cm^3). A
cm fibroid is seen in the right anterior uterine corpus. No abnormal
endometrial thickening. Cervical nabothian cysts are incidentally
noted.

-- Right ovary: Not visualized, however no adnexal mass identified.

-- Left ovary: A complex cystic lesion is seen in the anterior
pelvis which appears to originate from the left adnexa. This
measures 9.4 x 7.3 by 8.8 cm. This lesion shows diffuse mural
enhancement and nodular thickening, with largest nodule along the
left lateral wall measuring 1.7 cm. This is consistent with a cystic
ovarian neoplasm, and malignancy cannot be excluded.

Other: No peritoneal thickening or abnormal free fluid.

Musculoskeletal:  Unremarkable.
IMPRESSION: 9.4 cm complex cystic mass in the anterior pelvis which appears to
originate from the left adnexa. This is consistent with a cystic
ovarian neoplasm, and malignancy cannot be excluded.

No evidence of pelvic metastatic disease.

1.4 cm uterine fibroid.

Mild sigmoid diverticulosis, without evidence of diverticulitis.

## 2022-07-08 LAB — HM MAMMOGRAPHY

## 2022-09-09 LAB — HM DIABETES EYE EXAM

## 2022-09-18 ENCOUNTER — Other Ambulatory Visit (INDEPENDENT_AMBULATORY_CARE_PROVIDER_SITE_OTHER): Payer: BC Managed Care – PPO

## 2022-09-18 DIAGNOSIS — E1159 Type 2 diabetes mellitus with other circulatory complications: Secondary | ICD-10-CM

## 2022-09-18 DIAGNOSIS — I152 Hypertension secondary to endocrine disorders: Secondary | ICD-10-CM | POA: Diagnosis not present

## 2022-09-18 DIAGNOSIS — E785 Hyperlipidemia, unspecified: Secondary | ICD-10-CM | POA: Diagnosis not present

## 2022-09-18 LAB — COMPREHENSIVE METABOLIC PANEL
ALT: 32 U/L (ref 0–35)
AST: 21 U/L (ref 0–37)
Albumin: 4.4 g/dL (ref 3.5–5.2)
Alkaline Phosphatase: 94 U/L (ref 39–117)
BUN: 17 mg/dL (ref 6–23)
CO2: 29 mEq/L (ref 19–32)
Calcium: 10.9 mg/dL — ABNORMAL HIGH (ref 8.4–10.5)
Chloride: 104 mEq/L (ref 96–112)
Creatinine, Ser: 0.84 mg/dL (ref 0.40–1.20)
GFR: 77 mL/min (ref >60.00)
Glucose, Bld: 117 mg/dL — ABNORMAL HIGH (ref 70–99)
Potassium: 3.7 mEq/L (ref 3.5–5.1)
Sodium: 142 mEq/L (ref 135–145)
Total Bilirubin: 0.4 mg/dL (ref 0.2–1.2)
Total Protein: 6.9 g/dL (ref 6.0–8.3)

## 2022-09-18 LAB — LIPID PANEL
Cholesterol: 195 mg/dL (ref 0–200)
HDL: 73.7 mg/dL (ref >39.00)
LDL Cholesterol: 96 mg/dL (ref 0–99)
NonHDL: 121.13
Total CHOL/HDL Ratio: 3
Triglycerides: 125 mg/dL (ref 0.0–149.0)
VLDL: 25 mg/dL (ref 0.0–40.0)

## 2022-09-18 LAB — HEMOGLOBIN A1C: Hgb A1c MFr Bld: 6.9 % — ABNORMAL HIGH (ref 4.6–6.5)

## 2022-09-18 LAB — LDL CHOLESTEROL, DIRECT: Direct LDL: 105 mg/dL

## 2022-09-18 NOTE — Addendum Note (Signed)
Addended by: Clearnce Sorrel on: 09/18/2022 07:54 AM   Modules accepted: Orders

## 2022-09-23 ENCOUNTER — Ambulatory Visit: Payer: BC Managed Care – PPO | Admitting: Internal Medicine

## 2022-09-23 ENCOUNTER — Encounter: Payer: Self-pay | Admitting: Internal Medicine

## 2022-09-23 VITALS — BP 134/86 | HR 93 | Temp 97.6°F | Wt 171.0 lb

## 2022-09-23 DIAGNOSIS — E559 Vitamin D deficiency, unspecified: Secondary | ICD-10-CM

## 2022-09-23 DIAGNOSIS — I152 Hypertension secondary to endocrine disorders: Secondary | ICD-10-CM | POA: Diagnosis not present

## 2022-09-23 DIAGNOSIS — E1159 Type 2 diabetes mellitus with other circulatory complications: Secondary | ICD-10-CM

## 2022-09-23 DIAGNOSIS — E663 Overweight: Secondary | ICD-10-CM

## 2022-09-23 DIAGNOSIS — C562 Malignant neoplasm of left ovary: Secondary | ICD-10-CM

## 2022-09-23 DIAGNOSIS — N951 Menopausal and female climacteric states: Secondary | ICD-10-CM | POA: Diagnosis not present

## 2022-09-23 LAB — MICROALBUMIN / CREATININE URINE RATIO
Creatinine,U: 89.5 mg/dL
Microalb Creat Ratio: 1.1 mg/g (ref 0.0–30.0)
Microalb, Ur: 1 mg/dL (ref 0.0–1.9)

## 2022-09-23 LAB — VITAMIN D 25 HYDROXY (VIT D DEFICIENCY, FRACTURES): VITD: 54.46 ng/mL (ref 30.00–100.00)

## 2022-09-23 NOTE — Assessment & Plan Note (Signed)
Advised to avoid use of GLP 1 agonist given need for lifestyle modifications.  Rtc 6 months

## 2022-09-23 NOTE — Assessment & Plan Note (Addendum)
Diet controlled diabetes with recent slight rise  in A1c reviewed.  She is taking losartan 50 mg daily and she reports compliance with medication regimen  but has an elevated reading today in office.  She is not using NSAIDs daily.  Discussed goal of 120/70  (130/80 for patients over 70)  to preserve renal function.  She has been asked to check her  BP  at home and  submit readings for evaluation. Renal function, electrolytes and screen for proteinuria are all normal or pending

## 2022-09-23 NOTE — Assessment & Plan Note (Signed)
Currently taking 4000 ius daily

## 2022-09-23 NOTE — Patient Instructions (Addendum)
  The new goals for optimal blood pressure management are 120/70.  Please check your blood pressure a few times at home (once daily at various times,  after sitting quietly  for 5 minutes ) and send me the readings so I can determine if you need a change in medication    Rechecking calcium  and PTH today.    No med changes

## 2022-09-23 NOTE — Assessment & Plan Note (Signed)
Previously resolved s/p partial  parathyroidectomy, but elevation noted  on current April labs.  Rechecking with PTH and PTHRP given recent ovarian tumor resection .  Taking 4000 Ius of D3 daily and no calcium supplemebts

## 2022-09-23 NOTE — Progress Notes (Signed)
Subjective:  Patient ID: Dawn Russell, female    DOB: 01-31-1966  Age: 57 y.o. MRN: [id number]  CC: The primary encounter diagnosis was Borderline serous cystadenoma of left ovary. Diagnoses of Hypertension associated with type 2 diabetes mellitus, Hypercalcemia, Menopausal symptom, Overweight (BMI 25.0-29.9), and Vitamin D deficiency were also pertinent to this visit.   HPI Dawn Russell presents for follow up on multiple issues  Chief Complaint  Patient presents with   Medical Management of Chronic Issues    Follow up on diabetes   1) Type 2 DM:  She feels generally well, has not been exercising lately or  checking blood sugars  due to multiple trips to [place] to take care of mother and recent hysterectomy.o.  Has gained 5 lbs since January B  Denies any recent hypoglyemic events.  Following a carbohydrate modified diet 6 days per week. Denies numbness, burning and tingling of extremities. Appetite is good. Has deferred statin therapy .  Eye exam done recently; no retinopathy     2) Hypertension: patient has not been checking blood pressure twice weekly at home.  Readings have been < 130/80 on all prior visits.  Patient is following a reduce salt diet most days and is taking losartan 50 mg daily  as prescribed   3) Borderline serous tumor of left ovary:  incidentally found in Nov 2022 during workup for renal stones.  9.5 cm  considered a recurrence of tumor found 20 yrs ago..  s/p TLH and LSO at [facility]. Most recent follow up was in Feb 2024 with drop in CA 125 from 23 preopertively to 7 post operatively  4) hypercalcemia : recurrent.  H/o hyperparathyroidism. S/p resection in 2022 of one gland  with normalization of calcium by December labs. .  Taking 4000 ius of D3 . Not taking any calcium supplement.     Outpatient Medications Prior to Visit  Medication Sig Dispense Refill   albuterol (VENTOLIN HFA) 108 (90 Base) MCG/ACT inhaler INHALE TWO PUFFS 4 TIMES DAILY AS NEEDED FOR   WHEEZING 18 g 0   Azelaic Acid 15 % gel Apply 1 application. topically 2 (two) times daily.     cholecalciferol (VITAMIN D) 25 MCG (1000 UNIT) tablet Take 4,000 Units by mouth daily.     doxycycline (VIBRAMYCIN) 100 MG capsule Take 1 capsule (100 mg total) by mouth 2 (two) times daily. (Patient taking differently: Take 100 mg by mouth daily.) 14 capsule 0   Efinaconazole (JUBLIA) 10 % SOLN      loratadine (CLARITIN) 10 MG tablet Take 10 mg by mouth daily.     losartan (COZAAR) 50 MG tablet TAKE 1 TABLET DAILY 90 tablet 3   sodium chloride (OCEAN) 0.65 % SOLN nasal spray Place 1 spray into both nostrils as needed for congestion.     No facility-administered medications prior to visit.    Review of Systems;  Patient denies headache, fevers, malaise, unintentional weight loss, skin rash, eye pain, sinus congestion and sinus pain, sore throat, dysphagia,  hemoptysis , cough, dyspnea, wheezing, chest pain, palpitations, orthopnea, edema, abdominal pain, nausea, melena, diarrhea, constipation, flank pain, dysuria, hematuria, urinary  Frequency, nocturia, numbness, tingling, seizures,  Focal weakness, Loss of consciousness,  Tremor, insomnia, depression, anxiety, and suicidal ideation.      Objective:  BP 134/86   Pulse 93   Temp 97.6 F (36.4 C)   Wt 171 lb (77.6 kg)   SpO2 98%   BMI 26.78 kg/m   BP  Readings from Last 3 Encounters:  09/23/22 134/86  06/18/22 122/72  05/29/22 128/68    Wt Readings from Last 3 Encounters:  09/23/22 171 lb (77.6 kg)  06/18/22 165 lb 12.8 oz (75.2 kg)  05/29/22 166 lb 6 oz (75.5 kg)    Physical Exam Vitals reviewed.  Constitutional:      General: She is not in acute distress.    Appearance: Normal appearance. She is normal weight. She is not ill-appearing, toxic-appearing or diaphoretic.  HENT:     Head: Normocephalic.  Eyes:     General: No scleral icterus.       Right eye: No discharge.        Left eye: No discharge.     Conjunctiva/sclera:  Conjunctivae normal.  Cardiovascular:     Rate and Rhythm: Normal rate and regular rhythm.     Heart sounds: Normal heart sounds.  Pulmonary:     Effort: Pulmonary effort is normal. No respiratory distress.     Breath sounds: Normal breath sounds.  Musculoskeletal:        General: Normal range of motion.  Skin:    General: Skin is warm and dry.  Neurological:     General: No focal deficit present.     Mental Status: She is alert and oriented to person, place, and time. Mental status is at baseline.  Psychiatric:        Mood and Affect: Mood normal.        Behavior: Behavior normal.        Thought Content: Thought content normal.        Judgment: Judgment normal.    Lab Results  Component Value Date   HGBA1C 6.9 (H) 09/18/2022   HGBA1C 6.5 03/18/2022   HGBA1C 7.0 (H) 08/04/2021    Lab Results  Component Value Date   CREATININE 0.84 09/18/2022   CREATININE 0.65 03/18/2022   CREATININE 0.71 08/04/2021    Lab Results  Component Value Date   WBC 8.7 08/04/2021   HGB 14.1 08/04/2021   HCT 42.2 08/04/2021   PLT 271.0 08/04/2021   GLUCOSE 117 (H) 09/18/2022   CHOL 195 09/18/2022   TRIG 125.0 09/18/2022   HDL 73.70 09/18/2022   LDLDIRECT 105.0 09/18/2022   LDLCALC 96 09/18/2022   ALT 32 09/18/2022   AST 21 09/18/2022   NA 142 09/18/2022   K 3.7 09/18/2022   CL 104 09/18/2022   CREATININE 0.84 09/18/2022   BUN 17 09/18/2022   CO2 29 09/18/2022   TSH 0.75 03/18/2022   HGBA1C 6.9 (H) 09/18/2022   MICROALBUR 1.0 09/23/2022    MR PELVIS W WO CONTRAST  Result Date: 05/23/2021 CLINICAL DATA:  Cystic pelvic mass. EXAM: MRI PELVIS WITHOUT AND WITH CONTRAST TECHNIQUE: Multiplanar multisequence MR imaging of the pelvis was performed both before and after administration of intravenous contrast. CONTRAST:  16mL MULTIHANCE GADOBENATE DIMEGLUMINE 529 MG/ML IV SOLN COMPARISON:  Ultrasound on 04/29/2021, and CT on 04/14/2021 FINDINGS: Lower Urinary Tract: No urinary bladder or  urethral abnormality identified. Bowel: Mild sigmoid diverticulosis, without evidence of diverticulitis. Vascular/Lymphatic: Unremarkable. No pathologically enlarged pelvic lymph nodes identified. Reproductive: -- Uterus: Measures 8.3 x 3.8 x 4.5 cm (volume = 74 cm^3). A 1.4 cm fibroid is seen in the right anterior uterine corpus. No abnormal endometrial thickening. Cervical nabothian cysts are incidentally noted. -- Right ovary: Not visualized, however no adnexal mass identified. -- Left ovary: A complex cystic lesion is seen in the anterior pelvis which appears to originate from  the left adnexa. This measures 9.4 x 7.3 by 8.8 cm. This lesion shows diffuse mural enhancement and nodular thickening, with largest nodule along the left lateral wall measuring 1.7 cm. This is consistent with a cystic ovarian neoplasm, and malignancy cannot be excluded. Other: No peritoneal thickening or abnormal free fluid. Musculoskeletal:  Unremarkable. IMPRESSION: 9.4 cm complex cystic mass in the anterior pelvis which appears to originate from the left adnexa. This is consistent with a cystic ovarian neoplasm, and malignancy cannot be excluded. No evidence of pelvic metastatic disease. 1.4 cm uterine fibroid. Mild sigmoid diverticulosis, without evidence of diverticulitis. Electronically Signed   By: Danae Orleans M.D.   On: 05/23/2021 18:42    Assessment & Plan:  .Borderline serous cystadenoma of left ovary Assessment & Plan: She has follow up every 6 months with gyn onc awith  Ca125 and u/s every 2 years .  HRT generally contraindicated.    Hypertension associated with type 2 diabetes mellitus Assessment & Plan: Diet controlled diabetes with recent slight rise  in A1c reviewed.  She is taking losartan 50 mg daily and she reports compliance with medication regimen  but has an elevated reading today in office.  She is not using NSAIDs daily.  Discussed goal of 120/70  (130/80 for patients over 70)  to preserve renal  function.  She has been asked to check her  BP  at home and  submit readings for evaluation. Renal function, electrolytes and screen for proteinuria are all normal or pending   Orders: -     Microalbumin / creatinine urine ratio  Hypercalcemia Assessment & Plan: Previously resolved s/p partial  parathyroidectomy, but elevation noted  on current April labs.  Rechecking with PTH and PTHRP given recent ovarian tumor resection .  Taking 4000 Ius of D3 daily and no calcium supplemebts   Orders: -     PTH, intact and calcium -     PTH-related peptide -     VITAMIN D 25 Hydroxy (Vit-D Deficiency, Fractures)  Menopausal symptom Assessment & Plan: Avoiding HRT given history of recurrent ovarian tumor    Overweight (BMI 25.0-29.9) Assessment & Plan: Advised to avoid use of GLP 1 agonist given need for lifestyle modifications.  Rtc 6 months    Vitamin D deficiency Assessment & Plan: Currently taking 4000 ius daily       I provided 30 minutes of face-to-face time during this encounter reviewing patient's last visit with me, patient's  most recent visit with  gyn onc  endocrinology,    recent surgical and non surgical procedures, previous  labs and imaging studies, counseling on currently addressed issues,  and post visit ordering to diagnostics and therapeutics .   Follow-up: No follow-ups on file.   Sherlene Shams, MD

## 2022-09-23 NOTE — Assessment & Plan Note (Signed)
Avoiding HRT given history of recurrent ovarian tumor

## 2022-09-23 NOTE — Assessment & Plan Note (Addendum)
She has follow up every 6 months with gyn onc awith  Ca125 and u/s every 2 years .  HRT generally contraindicated.

## 2022-09-26 LAB — PTH, INTACT AND CALCIUM
Calcium: 9.8 mg/dL (ref 8.6–10.4)
PTH: 37 pg/mL (ref 16–77)

## 2022-10-06 LAB — PTH-RELATED PEPTIDE: PTH-Related Protein (PTH-RP): 11 pg/mL (ref 11–20)

## 2022-10-14 ENCOUNTER — Other Ambulatory Visit: Payer: Self-pay | Admitting: Internal Medicine

## 2022-12-21 ENCOUNTER — Telehealth: Payer: Self-pay | Admitting: Internal Medicine

## 2022-12-22 ENCOUNTER — Ambulatory Visit: Payer: BC Managed Care – PPO | Admitting: Nurse Practitioner

## 2022-12-22 ENCOUNTER — Encounter: Payer: Self-pay | Admitting: Nurse Practitioner

## 2022-12-22 VITALS — BP 132/82 | HR 81 | Temp 98.1°F | Ht 67.0 in | Wt 172.0 lb

## 2022-12-22 DIAGNOSIS — I152 Hypertension secondary to endocrine disorders: Secondary | ICD-10-CM | POA: Diagnosis not present

## 2022-12-22 DIAGNOSIS — E1159 Type 2 diabetes mellitus with other circulatory complications: Secondary | ICD-10-CM | POA: Diagnosis not present

## 2022-12-22 DIAGNOSIS — E210 Primary hyperparathyroidism: Secondary | ICD-10-CM

## 2022-12-22 MED ORDER — LOSARTAN POTASSIUM 100 MG PO TABS
100.0000 mg | ORAL_TABLET | Freq: Every day | ORAL | 1 refills | Status: DC
Start: 2022-12-22 — End: 2023-03-01

## 2022-12-22 NOTE — Patient Instructions (Signed)
Increase losartan 50 to 100 mg. Check BP twice a day and send number via mychart

## 2022-12-22 NOTE — Progress Notes (Unsigned)
Established Patient Office Visit  Subjective:  Patient ID: Dawn Russell, female    DOB: 1965-07-21  Age: 57 y.o. MRN: [id number]  CC:  Chief Complaint  Patient presents with   Medical Management of Chronic Issues    HPI  Dawn Russell presents for:  HPI   Past Medical History:  Diagnosis Date   Abnormal Pap smear    ASCUS   Asthma    exercise and cold(weather) induced   Concussion 06/24/2014   improved   COVID-19 05/2019   Dyspareunia    Fibrocystic breast changes    History of kidney stones    History of renal calculi 12/2010   s/p lithotripsy [place]   Hypertension        Ovarian low malignant potential tumor 2005   right salpingo-oophorectomy   Pelvic pain in female    Vaginitis     Past Surgical History:  Procedure Laterality Date   ABDOMINAL HYSTERECTOMY Bilateral 06/2021   CESAREAN SECTION     CHOLECYSTECTOMY     COLONOSCOPY WITH PROPOFOL N/A 10/11/2015   Procedure: COLONOSCOPY WITH PROPOFOL;  Surgeon: Midge Minium, MD;  Location: [facility];  Service: Endoscopy;  Laterality: N/A;  latex sensitivity   COLONOSCOPY WITH PROPOFOL N/A 10/11/2020   Procedure: COLONOSCOPY WITH BIOPSY;  Surgeon: Midge Minium, MD;  Location: [facility];  Service: Endoscopy;  Laterality: N/A;  Latex priority 4   EXTRACORPOREAL SHOCK WAVE LITHOTRIPSY Left 04/17/2021   Procedure: LEFT EXTRACORPOREAL SHOCK WAVE LITHOTRIPSY (ESWL);  Surgeon: Belva Agee, MD;  Location: [facility];  Service: Urology;  Laterality: Left;   OOPHORECTOMY Right 2004   due to large benign cyst,  elevated CA125   PARATHYROIDECTOMY Left 12/02/2020   Procedure: LEFT INFERIOR PARATHYROIDECTOMY;  Surgeon: Darnell Level, MD;  Location: [facility];  Service: General;  Laterality: Left;   POLYPECTOMY  10/11/2015   Procedure: POLYPECTOMY;  Surgeon: Midge Minium, MD;  Location: [facility];  Service: Endoscopy;;   POLYPECTOMY N/A 10/11/2020   Procedure: POLYPECTOMY;   Surgeon: Midge Minium, MD;  Location: [facility];  Service: Endoscopy;  Laterality: N/A;    Family History  Problem Relation Age of Onset   Hypertension Father    Stroke Father    Hypertension Mother    Hyperlipidemia Mother    Cancer Sister        brain tumor    Social History   Socioeconomic History   Marital status: Married    Spouse name: Not on file   Number of children: Not on file   Years of education: Not on file   Highest education level: Not on file  Occupational History   Not on file  Tobacco Use   Smoking status: Never   Smokeless tobacco: Never  Vaping Use   Vaping status: Never Used  Substance and Sexual Activity   Alcohol use: Yes    Comment: rare   Drug use: Never   Sexual activity: Yes    Birth control/protection: Surgical    Comment: vas.  Other Topics Concern   Not on file  Social History Narrative   Married    Social Determinants of Health   Financial Resource Strain: Not on file  Food Insecurity: Not on file  Transportation Needs: Not on file  Physical Activity: Not on file  Stress: Not on file  Social Connections: Not on file  Intimate Partner Violence: Not on file     Outpatient Medications Prior to Visit  Medication Sig Dispense Refill   albuterol (VENTOLIN HFA) 108 (90 Base) MCG/ACT inhaler INHALE TWO PUFFS 4 TIMES DAILY AS NEEDED FOR  WHEEZING 18 g 0   Azelaic Acid 15 % gel Apply 1 application. topically 2 (two) times daily.     cholecalciferol (VITAMIN D) 25 MCG (1000 UNIT) tablet Take 4,000 Units by mouth daily.     Efinaconazole (JUBLIA) 10 % SOLN      loratadine (CLARITIN) 10 MG tablet Take 10 mg by mouth daily.     sodium chloride (OCEAN) 0.65 % SOLN nasal spray Place 1 spray into both nostrils as needed for congestion.     doxycycline (VIBRAMYCIN) 100 MG capsule Take 1 capsule (100 mg total) by mouth 2 (two) times daily. (Patient not taking: Reported on 12/22/2022) 14 capsule 0   losartan (COZAAR) 50 MG tablet TAKE 1  TABLET DAILY 90 tablet 3   No facility-administered medications prior to visit.    Allergies  Allergen Reactions   Amoxicillin Rash   Latex Rash    Burning after internal ultrasound   Monistat [Miconazole] Rash    Itching and burning   Septra [Bactrim] Rash   Sulfa Antibiotics Rash   Sulfamethoxazole-Trimethoprim Rash    ROS Review of Systems Negative unless indicated in HPI.    Objective:    Physical Exam  BP 132/82   Pulse 81   Temp 98.1 F (36.7 C)   Ht 5\' 7"  (1.702 m)   Wt 172 lb (78 kg)   SpO2 98%   BMI 26.94 kg/m  Wt Readings from Last 3 Encounters:  12/22/22 172 lb (78 kg)  09/23/22 171 lb (77.6 kg)  06/18/22 165 lb 12.8 oz (75.2 kg)     Health Maintenance  Topic Date Due   HIV Screening  Never done   Zoster Vaccines- Shingrix (1 of 2) Never done   COVID-19 Vaccine (3 - Pfizer risk series) 10/18/2019   INFLUENZA VACCINE  01/07/2023   HEMOGLOBIN A1C  03/20/2023   FOOT EXAM  06/19/2023   MAMMOGRAM  07/09/2023   OPHTHALMOLOGY EXAM  09/09/2023   Diabetic kidney evaluation - eGFR measurement  09/18/2023   Diabetic kidney evaluation - Urine ACR  09/23/2023   DTaP/Tdap/Td (3 - Td or Tdap) 06/24/2024   Colonoscopy  10/11/2025   Hepatitis C Screening  Completed   HPV VACCINES  Aged Out    There are no preventive care reminders to display for this patient.  Lab Results  Component Value Date   TSH 0.75 03/18/2022   Lab Results  Component Value Date   WBC 8.7 08/04/2021   HGB 14.1 08/04/2021   HCT 42.2 08/04/2021   MCV 80.3 08/04/2021   PLT 271.0 08/04/2021   Lab Results  Component Value Date   NA 142 09/18/2022   K 3.7 09/18/2022   CO2 29 09/18/2022   GLUCOSE 117 (H) 09/18/2022   BUN 17 09/18/2022   CREATININE 0.84 09/18/2022   BILITOT 0.4 09/18/2022   ALKPHOS 94 09/18/2022   AST 21 09/18/2022   ALT 32 09/18/2022   PROT 6.9 09/18/2022   ALBUMIN 4.4 09/18/2022   CALCIUM 9.8 09/23/2022   ANIONGAP 10 04/14/2021   GFR 77.00 09/18/2022    Lab Results  Component Value Date   CHOL 195 09/18/2022   Lab Results  Component Value Date   HDL 73.70 09/18/2022   Lab Results  Component Value Date   LDLCALC 96 09/18/2022   Lab Results  Component Value Date  TRIG 125.0 09/18/2022   Lab Results  Component Value Date   CHOLHDL 3 09/18/2022   Lab Results  Component Value Date   HGBA1C 6.9 (H) 09/18/2022      Assessment & Plan:  Primary hyperparathyroidism (HCC)  Other orders -     Losartan Potassium; Take 1 tablet (100 mg total) by mouth daily.  Dispense: 30 tablet; Refill: 1    Follow-up: No follow-ups on file.   Kara Dies, NP

## 2023-01-05 NOTE — Assessment & Plan Note (Signed)
Patient BP 132/82 in the office. Advised pt to follow a low sodium and heart healthy diet. Increased losartan from 50 mg to 100 mg. Advised to check BP at home and send the numbers via MyChart and 2 week nurse visit for BP check.

## 2023-01-07 ENCOUNTER — Ambulatory Visit: Payer: BC Managed Care – PPO

## 2023-01-11 ENCOUNTER — Ambulatory Visit: Payer: BC Managed Care – PPO

## 2023-01-11 ENCOUNTER — Ambulatory Visit (INDEPENDENT_AMBULATORY_CARE_PROVIDER_SITE_OTHER): Payer: BC Managed Care – PPO | Admitting: *Deleted

## 2023-01-11 VITALS — BP 131/77 | HR 76 | Ht 67.0 in

## 2023-01-11 DIAGNOSIS — I152 Hypertension secondary to endocrine disorders: Secondary | ICD-10-CM

## 2023-01-11 DIAGNOSIS — E1159 Type 2 diabetes mellitus with other circulatory complications: Secondary | ICD-10-CM | POA: Diagnosis not present

## 2023-01-11 NOTE — Progress Notes (Signed)
Patient here for nurse visit BP check per order from Kara Dies, NP  Patient reports compliance with prescribed BP medications: yes  Increased Losartan 50mg  to 100mg  on 12/24/22.  BP Readings from Last 3 Encounters:  01/11/23 131/77  12/22/22 132/82  09/23/22 134/86   Pulse Readings from Last 3 Encounters:  01/11/23 76  12/22/22 81  09/23/22 93   Pt also wrote down home BP readings:  7/17-156/90 @ 10:30am 145/82 @ 10:35am 7/19-130/84 @ 11pm 7/21 132/80 @ 1pm 7/22 148/892 @ 10pm 7/28 126/82 @ 4pm 7/30 154/90 @ 10pm  154/88 @ 10:05pm 8/1 147/82 @ 10:30pm  133/77 @ 10:35 8/4 138/81 @ 11pm  Pt advised to continue meds as she is currently taking.  Patient verbalized understanding of instructions.   Thurmond Butts, CMA

## 2023-02-27 ENCOUNTER — Other Ambulatory Visit: Payer: Self-pay | Admitting: Nurse Practitioner

## 2023-03-25 ENCOUNTER — Encounter: Payer: Self-pay | Admitting: Internal Medicine

## 2023-03-25 ENCOUNTER — Ambulatory Visit: Payer: BC Managed Care – PPO | Admitting: Internal Medicine

## 2023-03-25 VITALS — BP 130/82 | HR 90 | Ht 67.0 in | Wt 171.2 lb

## 2023-03-25 DIAGNOSIS — E785 Hyperlipidemia, unspecified: Secondary | ICD-10-CM | POA: Diagnosis not present

## 2023-03-25 DIAGNOSIS — N200 Calculus of kidney: Secondary | ICD-10-CM

## 2023-03-25 DIAGNOSIS — Z23 Encounter for immunization: Secondary | ICD-10-CM

## 2023-03-25 DIAGNOSIS — E663 Overweight: Secondary | ICD-10-CM | POA: Diagnosis not present

## 2023-03-25 DIAGNOSIS — C562 Malignant neoplasm of left ovary: Secondary | ICD-10-CM | POA: Diagnosis not present

## 2023-03-25 DIAGNOSIS — I152 Hypertension secondary to endocrine disorders: Secondary | ICD-10-CM | POA: Diagnosis not present

## 2023-03-25 DIAGNOSIS — D126 Benign neoplasm of colon, unspecified: Secondary | ICD-10-CM

## 2023-03-25 DIAGNOSIS — E1159 Type 2 diabetes mellitus with other circulatory complications: Secondary | ICD-10-CM

## 2023-03-25 DIAGNOSIS — Z780 Asymptomatic menopausal state: Secondary | ICD-10-CM

## 2023-03-25 DIAGNOSIS — M8580 Other specified disorders of bone density and structure, unspecified site: Secondary | ICD-10-CM

## 2023-03-25 LAB — CBC WITH DIFFERENTIAL/PLATELET
Basophils Absolute: 0 10*3/uL (ref 0.0–0.1)
Basophils Relative: 0.4 % (ref 0.0–3.0)
Eosinophils Absolute: 0.2 10*3/uL (ref 0.0–0.7)
Eosinophils Relative: 2.5 % (ref 0.0–5.0)
HCT: 43.8 % (ref 36.0–46.0)
Hemoglobin: 14.2 g/dL (ref 12.0–15.0)
Lymphocytes Relative: 24 % (ref 12.0–46.0)
Lymphs Abs: 2.1 10*3/uL (ref 0.7–4.0)
MCHC: 32.5 g/dL (ref 30.0–36.0)
MCV: 83.3 fL (ref 78.0–100.0)
Monocytes Absolute: 0.5 10*3/uL (ref 0.1–1.0)
Monocytes Relative: 5.2 % (ref 3.0–12.0)
Neutro Abs: 6 10*3/uL (ref 1.4–7.7)
Neutrophils Relative %: 67.9 % (ref 43.0–77.0)
Platelets: 300 10*3/uL (ref 150.0–400.0)
RBC: 5.26 Mil/uL — ABNORMAL HIGH (ref 3.87–5.11)
RDW: 13.6 % (ref 11.5–15.5)
WBC: 8.8 10*3/uL (ref 4.0–10.5)

## 2023-03-25 LAB — URINALYSIS, ROUTINE W REFLEX MICROSCOPIC
Bilirubin Urine: NEGATIVE
Hgb urine dipstick: NEGATIVE
Ketones, ur: NEGATIVE
Leukocytes,Ua: NEGATIVE
Nitrite: NEGATIVE
Specific Gravity, Urine: 1.02 (ref 1.000–1.030)
Total Protein, Urine: NEGATIVE
Urine Glucose: NEGATIVE
Urobilinogen, UA: 0.2 (ref 0.0–1.0)
pH: 6 (ref 5.0–8.0)

## 2023-03-25 LAB — LIPID PANEL
Cholesterol: 216 mg/dL — ABNORMAL HIGH (ref 0–200)
HDL: 70.1 mg/dL (ref >39.00)
LDL Cholesterol: 119 mg/dL — ABNORMAL HIGH (ref 0–99)
NonHDL: 145.42
Total CHOL/HDL Ratio: 3
Triglycerides: 130 mg/dL (ref 0.0–149.0)
VLDL: 26 mg/dL (ref 0.0–40.0)

## 2023-03-25 LAB — COMPREHENSIVE METABOLIC PANEL
ALT: 40 U/L — ABNORMAL HIGH (ref 0–35)
AST: 21 U/L (ref 0–37)
Albumin: 4.7 g/dL (ref 3.5–5.2)
Alkaline Phosphatase: 95 U/L (ref 39–117)
BUN: 15 mg/dL (ref 6–23)
CO2: 28 meq/L (ref 19–32)
Calcium: 10 mg/dL (ref 8.4–10.5)
Chloride: 103 meq/L (ref 96–112)
Creatinine, Ser: 0.67 mg/dL (ref 0.40–1.20)
GFR: 96.49 mL/min (ref >60.00)
Glucose, Bld: 132 mg/dL — ABNORMAL HIGH (ref 70–99)
Potassium: 4.1 meq/L (ref 3.5–5.1)
Sodium: 141 meq/L (ref 135–145)
Total Bilirubin: 0.5 mg/dL (ref 0.2–1.2)
Total Protein: 7.4 g/dL (ref 6.0–8.3)

## 2023-03-25 LAB — TSH: TSH: 1.45 u[IU]/mL (ref 0.35–5.50)

## 2023-03-25 LAB — LDL CHOLESTEROL, DIRECT: Direct LDL: 126 mg/dL

## 2023-03-25 LAB — HEMOGLOBIN A1C: Hgb A1c MFr Bld: 6.8 % — ABNORMAL HIGH (ref 4.6–6.5)

## 2023-03-25 MED ORDER — LOSARTAN POTASSIUM 100 MG PO TABS: 100.0000 mg | ORAL_TABLET | Freq: Every day | ORAL | 1 refills | Status: DC

## 2023-03-25 NOTE — Assessment & Plan Note (Addendum)
Diet controlled diabetes ,  A1c  is < 7.0  .   She is taking losartan 100 mg daily and she reports compliance with medication regimen  but has an elevated reading today in office.  She is not using NSAIDs daily.   She has been asked to  continue to check her  BP  at home and  submit readings for evaluation. Renal function, electrolytes and screen for proteinuria are all normal .  Stati advsed. iven today   Lab Results  Component Value Date   HGBA1C 6.8 (H) 03/25/2023   Lab Results  Component Value Date   MICROALBUR 1.0 09/23/2022   MICROALBUR <0.7 08/04/2021   Lab Results  Component Value Date   NA 141 03/25/2023   K 4.1 03/25/2023   CL 103 03/25/2023   CO2 28 03/25/2023   Lab Results  Component Value Date   CREATININE 0.67 03/25/2023

## 2023-03-25 NOTE — Progress Notes (Signed)
Subjective:  Patient ID: Dawn Russell, female    DOB: [birth year]  Age: 57 y.o. MRN: [id number]  CC: The primary encounter diagnosis was Hypertension associated with type 2 diabetes mellitus (HCC). 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   HPI Joen Hevner Durflinger presents for  Chief Complaint  Patient presents with   Medical Management of Chronic Issues    6 month follow up    1) Type 2 DM : She  feels generally well, is exercising several times per week and checking blood sugars once daily at variable times.  BS have been under 130 fasting and < 150 post prandially.  Denies any recent hypoglyemic events. . Following a carbohydrate modified diet 6 days per week. Denies numbness, burning and tingling of extremities. Appetite is good.   walking daily  going to the gym 3/week following a low GI diet.   2) HTN:  Patient is taking her medications as prescribed and notes no adverse effects.  Home BP readings have been done about once per week and are  generally < 130/80 .  She is avoiding added salt in her diet and walking regularly about 3 times per week for exercise  .    Outpatient Medications Prior to Visit  Medication Sig Dispense Refill   albuterol (VENTOLIN HFA) 108 (90 Base) MCG/ACT inhaler INHALE TWO PUFFS 4 TIMES DAILY AS NEEDED FOR  WHEEZING 18 g 0   Azelaic Acid 15 % gel Apply 1 application. topically 2 (two) times daily.     cholecalciferol (VITAMIN D) 25 MCG (1000 UNIT) tablet Take 4,000 Units by mouth daily.     doxycycline (VIBRAMYCIN) 100 MG capsule Take 1 capsule (100 mg total) by mouth 2 (two) times daily. (Patient taking differently: Take 100 mg by mouth daily as needed.) 14  capsule 0   Efinaconazole (JUBLIA) 10 % SOLN      loratadine (CLARITIN) 10 MG tablet Take 10 mg by mouth daily.     sodium chloride (OCEAN) 0.65 % SOLN nasal spray Place 1 spray into both nostrils as needed for congestion.     losartan (COZAAR) 100 MG tablet Take 1 tablet by mouth once daily 30 tablet 0   No facility-administered medications prior to visit.    Review of Systems;  Patient denies headache, fevers, malaise, unintentional weight loss, skin rash, eye pain, sinus congestion and sinus pain, sore throat, dysphagia,  hemoptysis , cough, dyspnea, wheezing, chest pain, palpitations, orthopnea, edema, abdominal pain, nausea, melena, diarrhea, constipation, flank pain, dysuria, hematuria, urinary  Frequency, nocturia, numbness, tingling, seizures,  Focal weakness, Loss of consciousness,  Tremor, insomnia, depression, anxiety, and suicidal ideation.      Objective:  BP 130/82   Pulse 90   Ht 5\' 7"  (1.702 m)   Wt 171 lb 3.2 oz (77.7 kg)   SpO2 96%   BMI 26.81 kg/m   BP Readings from Last 3 Encounters:  03/25/23 130/82  01/11/23 131/77  12/22/22 132/82    Wt Readings from Last 3 Encounters:  03/25/23 171 lb 3.2 oz (77.7 kg)  12/22/22 172 lb (78 kg)  09/23/22 171 lb (77.6 kg)    Physical Exam Vitals reviewed.  Constitutional:      General: She is not in acute distress.  Appearance: Normal appearance. She is normal weight. She is not ill-appearing, toxic-appearing or diaphoretic.  HENT:     Head: Normocephalic.  Eyes:     General: No scleral icterus.       Right eye: No discharge.        Left eye: No discharge.     Conjunctiva/sclera: Conjunctivae normal.  Cardiovascular:     Rate and Rhythm: Normal rate and regular rhythm.     Heart sounds: Normal heart sounds.  Pulmonary:     Effort: Pulmonary effort is normal. No respiratory distress.     Breath sounds: Normal breath sounds.  Musculoskeletal:        General: Normal range of motion.  Skin:    General: Skin  is warm and dry.  Neurological:     General: No focal deficit present.     Mental Status: She is alert and oriented to person, place, and time. Mental status is at baseline.  Psychiatric:        Mood and Affect: Mood normal.        Behavior: Behavior normal.        Thought Content: Thought content normal.        Judgment: Judgment normal.     Lab Results  Component Value Date   HGBA1C 6.8 (H) 03/25/2023   HGBA1C 6.9 (H) 09/18/2022   HGBA1C 6.5 03/18/2022    Lab Results  Component Value Date   CREATININE 0.67 03/25/2023   CREATININE 0.84 09/18/2022   CREATININE 0.65 03/18/2022    Lab Results  Component Value Date   WBC 8.8 03/25/2023   HGB 14.2 03/25/2023   HCT 43.8 03/25/2023   PLT 300.0 03/25/2023   GLUCOSE 132 (H) 03/25/2023   CHOL 216 (H) 03/25/2023   TRIG 130.0 03/25/2023   HDL 70.10 03/25/2023   LDLDIRECT 126.0 03/25/2023   LDLCALC 119 (H) 03/25/2023   ALT 40 (H) 03/25/2023   AST 21 03/25/2023   NA 141 03/25/2023   K 4.1 03/25/2023   CL 103 03/25/2023   CREATININE 0.67 03/25/2023   BUN 15 03/25/2023   CO2 28 03/25/2023   TSH 1.45 03/25/2023   HGBA1C 6.8 (H) 03/25/2023   MICROALBUR 1.0 09/23/2022    MR PELVIS W WO CONTRAST  Result Date: 05/23/2021 CLINICAL DATA:  Cystic pelvic mass. EXAM: MRI PELVIS WITHOUT AND WITH CONTRAST TECHNIQUE: Multiplanar multisequence MR imaging of the pelvis was performed both before and after administration of intravenous contrast. CONTRAST:  16mL MULTIHANCE GADOBENATE DIMEGLUMINE 529 MG/ML IV SOLN COMPARISON:  Ultrasound on 04/29/2021, and CT on 04/14/2021 FINDINGS: Lower Urinary Tract: No urinary bladder or urethral abnormality identified. Bowel: Mild sigmoid diverticulosis, without evidence of diverticulitis. Vascular/Lymphatic: Unremarkable. No pathologically enlarged pelvic lymph nodes identified. Reproductive: -- Uterus: Measures 8.3 x 3.8 x 4.5 cm (volume = 74 cm^3). A 1.4 cm fibroid is seen in the right anterior uterine  corpus. No abnormal endometrial thickening. Cervical nabothian cysts are incidentally noted. -- Right ovary: Not visualized, however no adnexal mass identified. -- Left ovary: A complex cystic lesion is seen in the anterior pelvis which appears to originate from the left adnexa. This measures 9.4 x 7.3 by 8.8 cm. This lesion shows diffuse mural enhancement and nodular thickening, with largest nodule along the left lateral wall measuring 1.7 cm. This is consistent with a cystic ovarian neoplasm, and malignancy cannot be excluded. Other: No peritoneal thickening or abnormal free fluid. Musculoskeletal:  Unremarkable. IMPRESSION: 9.4 cm complex cystic mass in the anterior pelvis  which appears to originate from the left adnexa. This is consistent with a cystic ovarian neoplasm, and malignancy cannot be excluded. No evidence of pelvic metastatic disease. 1.4 cm uterine fibroid. Mild sigmoid diverticulosis, without evidence of diverticulitis. Electronically Signed   By: Danae Orleans M.D.   On: 05/23/2021 18:42    Assessment & Plan:  .Hypertension associated with type 2 diabetes mellitus (HCC) Assessment & Plan: Diet controlled diabetes ,  A1c  is < 7.0  .   She is taking losartan 100 mg daily and she reports compliance with medication regimen  but has an elevated reading today in office.  She is not using NSAIDs daily.   She has been asked to  continue to check her  BP  at home and  submit readings for evaluation. Renal function, electrolytes and screen for proteinuria are all normal or pending.  Flu and Prevnar 20 vaccines given today   Lab Results  Component Value Date   HGBA1C 6.8 (H) 03/25/2023   Lab Results  Component Value Date   MICROALBUR 1.0 09/23/2022   MICROALBUR <0.7 08/04/2021   Lab Results  Component Value Date   NA 141 03/25/2023   K 4.1 03/25/2023   CL 103 03/25/2023   CO2 28 03/25/2023   Lab Results  Component Value Date   CREATININE 0.67 03/25/2023        Orders: -      Hemoglobin A1c -     Comprehensive metabolic panel  Hyperlipidemia, unspecified hyperlipidemia type -     Lipid panel -     LDL cholesterol, direct  Overweight (BMI 25.0-29.9) Assessment & Plan: Weight is stable.  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  Surveillance by Dr Tresa Endo at [facility] in [facility]  GYN ONC with CA 125 and CT abd pelvis every 6 months CA 125 was 7 in Feb and 6 in August.    Calcium oxalate renal calculi Assessment & Plan: Continue current regimen of water supplemented with citric acid   Orders: -     Urinalysis, Routine w reflex microscopic  Need for influenza vaccination -     Flu vaccine trivalent PF, 6mos and older(Flulaval,Afluria,Fluarix,Fluzone)  Need for pneumococcal 20-valent conjugate vaccination -     Pneumococcal conjugate vaccine 20-valent  Tubular adenoma of colon Assessment & Plan: by colonoscopy May 2022  5 yr follow up recommended ([place])   Osteopenia after menopause Assessment & Plan: T scores -1.0  in 2022   Other orders -     Losartan Potassium; Take 1 tablet (100 mg total) by mouth daily.  Dispense: 90 tablet; Refill: 1     I provided 34 minutes of face-to-face time during this encounter reviewing patient's last visit with me, patient's  most recent visit with  oncology, recent surgical and non surgical procedures, previous  labs and imaging studies, counseling on currently addressed issues,  and post visit ordering to diagnostics and therapeutics .   Follow-up: Return in about 6 months (around 09/23/2023).   Sherlene Shams, MD

## 2023-03-25 NOTE — Patient Instructions (Signed)
YOUR BLOOD PRESSURE IS UNDER BETTER CONTROL ON 100 MG LOSARTAN .  REMEMBER THAT OUR GOAL IS around 130/80 or less.  Please check your blood pressure once a week  at home and send me the readings in  a few months

## 2023-03-25 NOTE — Assessment & Plan Note (Addendum)
Recurrent,  first occurrence 20 yrs ago .  Surveillance by Dr Tresa Endo at [facility] in [facility]  GYN ONC with CA 125 and CT abd pelvis every 6 months CA 125 was 7 in Feb and 6 in August.

## 2023-03-27 NOTE — Assessment & Plan Note (Addendum)
Weight is stable.  Will consider use of  GLP 1 agonist if A1c is not < 6.5 in 6 months

## 2023-03-27 NOTE — Assessment & Plan Note (Signed)
Continue current regimen of water supplemented with citric acid

## 2023-03-27 NOTE — Assessment & Plan Note (Signed)
by colonoscopy May 2022  5 yr follow up recommended Servando Snare)

## 2023-03-27 NOTE — Assessment & Plan Note (Addendum)
T scores -1.0  in 2022

## 2023-03-31 ENCOUNTER — Other Ambulatory Visit: Payer: Self-pay | Admitting: Nurse Practitioner

## 2023-09-14 LAB — OPHTHALMOLOGY REPORT-SCANNED

## 2023-09-21 ENCOUNTER — Other Ambulatory Visit: Payer: Self-pay | Admitting: Internal Medicine

## 2023-09-23 ENCOUNTER — Encounter: Payer: Self-pay | Admitting: Internal Medicine

## 2023-09-23 ENCOUNTER — Ambulatory Visit: Payer: BC Managed Care – PPO | Admitting: Internal Medicine

## 2023-09-23 VITALS — BP 102/72 | HR 76 | Ht 67.0 in | Wt 164.8 lb

## 2023-09-23 DIAGNOSIS — E1159 Type 2 diabetes mellitus with other circulatory complications: Secondary | ICD-10-CM

## 2023-09-23 DIAGNOSIS — Z820 Family history of epilepsy and other diseases of the nervous system: Secondary | ICD-10-CM

## 2023-09-23 DIAGNOSIS — I152 Hypertension secondary to endocrine disorders: Secondary | ICD-10-CM | POA: Diagnosis not present

## 2023-09-23 DIAGNOSIS — E210 Primary hyperparathyroidism: Secondary | ICD-10-CM | POA: Diagnosis not present

## 2023-09-23 DIAGNOSIS — M7712 Lateral epicondylitis, left elbow: Secondary | ICD-10-CM

## 2023-09-23 DIAGNOSIS — E785 Hyperlipidemia, unspecified: Secondary | ICD-10-CM | POA: Diagnosis not present

## 2023-09-23 LAB — COMPREHENSIVE METABOLIC PANEL WITH GFR
ALT: 31 U/L (ref 0–35)
AST: 21 U/L (ref 0–37)
Albumin: 4.6 g/dL (ref 3.5–5.2)
Alkaline Phosphatase: 79 U/L (ref 39–117)
BUN: 18 mg/dL (ref 6–23)
CO2: 29 meq/L (ref 19–32)
Calcium: 9.6 mg/dL (ref 8.4–10.5)
Chloride: 105 meq/L (ref 96–112)
Creatinine, Ser: 0.69 mg/dL (ref 0.40–1.20)
GFR: 95.48 mL/min (ref >60.00)
Glucose, Bld: 120 mg/dL — ABNORMAL HIGH (ref 70–99)
Potassium: 4.1 meq/L (ref 3.5–5.1)
Sodium: 141 meq/L (ref 135–145)
Total Bilirubin: 0.5 mg/dL (ref 0.2–1.2)
Total Protein: 7.3 g/dL (ref 6.0–8.3)

## 2023-09-23 LAB — MICROALBUMIN / CREATININE URINE RATIO
Creatinine,U: 136.7 mg/dL
Microalb Creat Ratio: 8 mg/g (ref 0.0–30.0)
Microalb, Ur: 1.1 mg/dL (ref 0.0–1.9)

## 2023-09-23 LAB — LIPID PANEL
Cholesterol: 206 mg/dL — ABNORMAL HIGH (ref 0–200)
HDL: 63.9 mg/dL (ref >39.00)
LDL Cholesterol: 123 mg/dL — ABNORMAL HIGH (ref 0–99)
NonHDL: 142.3
Total CHOL/HDL Ratio: 3
Triglycerides: 97 mg/dL (ref 0.0–149.0)
VLDL: 19.4 mg/dL (ref 0.0–40.0)

## 2023-09-23 LAB — LDL CHOLESTEROL, DIRECT: Direct LDL: 133 mg/dL

## 2023-09-23 LAB — HEMOGLOBIN A1C: Hgb A1c MFr Bld: 6.6 % — ABNORMAL HIGH (ref 4.6–6.5)

## 2023-09-23 MED ORDER — SCOPOLAMINE 1 MG/3DAYS TD PT72: 1.0000 | MEDICATED_PATCH | TRANSDERMAL | 0 refills | Status: AC

## 2023-09-23 NOTE — Patient Instructions (Addendum)
 I do not recommend testing for Alzheimers,  but if you do want it,  I will be happy to refer you to Dr Mason Sole at [facility]    Whether you do or don't  ,  I  DO  highly recommend reading "the End of Alzheimer's: The First Program to Prevent and Reverse Cognitive Decline"  by Fonnie Iba, MD     Ok  to take 800 ius of Vitamin E daily    Zinc supplementation  50 mg daily  Vitamin C  1000 mg daily should be plenty    Left elbow tendonitis:  treat with activity modification, ice and ibuprofen or Aleve  Sea sickness patch sent to [employer].  Place on the day of the cruise,  replace every 72 hours

## 2023-09-23 NOTE — Progress Notes (Signed)
 Subjective:  Patient ID: Dawn Russell, female    DOB: [birth year]  Age: 58 y.o. MRN: [id number]  CC: The primary encounter diagnosis was Hypertension associated with type 2 diabetes mellitus (HCC). Diagnoses of Hyperlipidemia, unspecified hyperlipidemia type, Primary hyperparathyroidism (HCC), Lateral epicondylitis of left elbow, and Family history of Alzheimer disease were also pertinent to this visit.   HPI [name] presents for  Chief Complaint  Patient presents with   Medical Management of Chronic Issues    6 month follow up    1) abnormal mammogram:  has follow up in June with her  Bre[facility] at [facility]   2) left elbow pain, lateral side, new onset,  since starting an exercise class.  Does not lift more than 2 lb weights pain is aggraved by lifting any weight while arm is extended.    3) Mother has AD,  considering testing for alzheimers:    4) transdermal scopalamine patch needed  for upcoming cruise.  Has used it before   5) Type 2 DM:  she feels generally well, is exercising several times per week .  Not checking blood sugars   Denies any recent hypoglyemic symptoms .  Taking her medications as directed. Following a carbohydrate modified diet 6 days per week. Denies numbness, burning and tingling of extremities. Appetite is good.         Outpatient Medications Prior to Visit  Medication Sig Dispense Refill   albuterol  (VENTOLIN  HFA) 108 (90 Base) MCG/ACT inhaler INHALE TWO PUFFS 4 TIMES DAILY AS NEEDED FOR  WHEEZING 18 g 0   Azelaic Acid 15 % gel Apply 1 application. topically 2 (two) times daily.     cholecalciferol (VITAMIN D ) 25 MCG (1000 UNIT) tablet Take 4,000 Units by mouth daily.     doxycycline  (VIBRAMYCIN ) 100 MG capsule Take 1 capsule (100 mg total) by mouth 2 (two) times daily. (Patient taking differently: Take 100 mg by mouth daily as needed.) 14 capsule 0   Efinaconazole (JUBLIA) 10 % SOLN      loratadine (CLARITIN) 10 MG tablet Take 10 mg by  mouth daily.     losartan  (COZAAR ) 100 MG tablet TAKE 1 TABLET DAILY 90 tablet 3   sodium chloride  (OCEAN) 0.65 % SOLN nasal spray Place 1 spray into both nostrils as needed for congestion.     No facility-administered medications prior to visit.    Review of Systems;  Patient denies headache, fevers, malaise, unintentional weight loss, skin rash, eye pain, sinus congestion and sinus pain, sore throat, dysphagia,  hemoptysis , cough, dyspnea, wheezing, chest pain, palpitations, orthopnea, edema, abdominal pain, nausea, melena, diarrhea, constipation, flank pain, dysuria, hematuria, urinary  Frequency, nocturia, numbness, tingling, seizures,  Focal weakness, Loss of consciousness,  Tremor, insomnia, depression, anxiety, and suicidal ideation.      Objective:  BP 102/72   Pulse 76   Ht 5\' 7"  (1.702 m)   Wt 164 lb 12.8 oz (74.8 kg)   SpO2 98%   BMI 25.81 kg/m   BP Readings from Last 3 Encounters:  09/23/23 102/72  03/25/23 130/82  01/11/23 131/77    Wt Readings from Last 3 Encounters:  09/23/23 164 lb 12.8 oz (74.8 kg)  03/25/23 171 lb 3.2 oz (77.7 kg)  12/22/22 172 lb (78 kg)    Physical Exam Vitals reviewed.  Constitutional:      General: She is not in acute distress.    Appearance: Normal appearance. She is normal weight. She is not  ill-appearing, toxic-appearing or diaphoretic.  HENT:     Head: Normocephalic.  Eyes:     General: No scleral icterus.       Right eye: No discharge.        Left eye: No discharge.     Conjunctiva/sclera: Conjunctivae normal.  Cardiovascular:     Rate and Rhythm: Normal rate and regular rhythm.     Heart sounds: Normal heart sounds.  Pulmonary:     Effort: Pulmonary effort is normal. No respiratory distress.     Breath sounds: Normal breath sounds.  Musculoskeletal:        General: Tenderness present. Normal range of motion.     Left elbow: Tenderness present in lateral epicondyle.       Arms:  Skin:    General: Skin is warm and  dry.  Neurological:     General: No focal deficit present.     Mental Status: She is alert and oriented to person, place, and time. Mental status is at baseline.  Psychiatric:        Mood and Affect: Mood normal.        Behavior: Behavior normal.        Thought Content: Thought content normal.        Judgment: Judgment normal.    Lab Results  Component Value Date   HGBA1C 6.6 (H) 09/23/2023   HGBA1C 6.8 (H) 03/25/2023   HGBA1C 6.9 (H) 09/18/2022    Lab Results  Component Value Date   CREATININE 0.69 09/23/2023   CREATININE 0.67 03/25/2023   CREATININE 0.84 09/18/2022    Lab Results  Component Value Date   WBC 8.8 03/25/2023   HGB 14.2 03/25/2023   HCT 43.8 03/25/2023   PLT 300.0 03/25/2023   GLUCOSE 120 (H) 09/23/2023   CHOL 206 (H) 09/23/2023   TRIG 97.0 09/23/2023   HDL 63.90 09/23/2023   LDLDIRECT 133.0 09/23/2023   LDLCALC 123 (H) 09/23/2023   ALT 31 09/23/2023   AST 21 09/23/2023   NA 141 09/23/2023   K 4.1 09/23/2023   CL 105 09/23/2023   CREATININE 0.69 09/23/2023   BUN 18 09/23/2023   CO2 29 09/23/2023   TSH 1.45 03/25/2023   HGBA1C 6.6 (H) 09/23/2023   MICROALBUR 1.1 09/23/2023    MR PELVIS W WO CONTRAST Result Date: 05/23/2021 CLINICAL DATA:  Cystic pelvic mass. EXAM: MRI PELVIS WITHOUT AND WITH CONTRAST TECHNIQUE: Multiplanar multisequence MR imaging of the pelvis was performed both before and after administration of intravenous contrast. CONTRAST:  16mL MULTIHANCE  GADOBENATE DIMEGLUMINE  529 MG/ML IV SOLN COMPARISON:  Ultrasound on 04/29/2021, and CT on 04/14/2021 FINDINGS: Lower Urinary Tract: No urinary bladder or urethral abnormality identified. Bowel: Mild sigmoid diverticulosis, without evidence of diverticulitis. Vascular/Lymphatic: Unremarkable. No pathologically enlarged pelvic lymph nodes identified. Reproductive: -- Uterus: Measures 8.3 x 3.8 x 4.5 cm (volume = 74 cm^3). A 1.4 cm fibroid is seen in the right anterior uterine corpus. No abnormal  endometrial thickening. Cervical nabothian cysts are incidentally noted. -- Right ovary: Not visualized, however no adnexal mass identified. -- Left ovary: A complex cystic lesion is seen in the anterior pelvis which appears to originate from the left adnexa. This measures 9.4 x 7.3 by 8.8 cm. This lesion shows diffuse mural enhancement and nodular thickening, with largest nodule along the left lateral wall measuring 1.7 cm. This is consistent with a cystic ovarian neoplasm, and malignancy cannot be excluded. Other: No peritoneal thickening or abnormal free fluid. Musculoskeletal:  Unremarkable. IMPRESSION: 9.4  cm complex cystic mass in the anterior pelvis which appears to originate from the left adnexa. This is consistent with a cystic ovarian neoplasm, and malignancy cannot be excluded. No evidence of pelvic metastatic disease. 1.4 cm uterine fibroid. Mild sigmoid diverticulosis, without evidence of diverticulitis. Electronically Signed   By: Marlyce Sine M.D.   On: 05/23/2021 18:42    Assessment & Plan:  .Hypertension associated with type 2 diabetes mellitus (HCC) Assessment & Plan: Diet controlled diabetes ,  A1c  is < 7.0  .   She is taking losartan  100 mg daily and she reports compliance with medication regimen    She is not using NSAIDs daily.  Renal function, electrolytes and screen for proteinuria are all normal .  Statin  advised but deferred by patient.   Lab Results  Component Value Date   HGBA1C 6.6 (H) 09/23/2023   Lab Results  Component Value Date   MICROALBUR 1.1 09/23/2023   MICROALBUR 1.0 09/23/2022   Lab Results  Component Value Date   NA 141 09/23/2023   K 4.1 09/23/2023   CL 105 09/23/2023   CO2 29 09/23/2023   Lab Results  Component Value Date   CREATININE 0.69 09/23/2023        Orders: -     Microalbumin / creatinine urine ratio -     Comprehensive metabolic panel with GFR -     Hemoglobin A1c  Hyperlipidemia, unspecified hyperlipidemia type -     LDL  cholesterol, direct -     Lipid panel  Primary hyperparathyroidism (HCC) Assessment & Plan: She had left inferior parathyroidectomy by Dr. Sofia Dunn 12/02/2020: 0.7 x 0.4 x 0.4 cm, 0.085 g parathyroid  adenoma resected. Post operative calcium level was normal.  She  avoiding thiazide diuretics  taking 4000 Ius of D3 ,   calcium level is normal   Lab Results  Component Value Date   PTH 37 09/23/2022   CALCIUM 9.6 09/23/2023   CAION 5.33 09/13/2018   PHOS 3.0 03/22/2019   .    Lateral epicondylitis of left elbow Assessment & Plan: Secondary to strain during exercise class. RICE advised   Family history of Alzheimer disease Assessment & Plan: Risks and benefits of testing advised.  She has deferred.  I recommend reading "the End of Alzheimer's: The First Program to Prevent and Reverse Cognitive Decline"  by Fonnie Iba, MD     Other orders -     Scopolamine ; Place 1 patch (1.5 mg total) onto the skin every 3 (three) days.  Dispense: 4 patch; Refill: 0     I spent 34 minutes on the day of this face to face encounter reviewing patient's  family history, ,  prior relevant surgical and non surgical procedures, recent  labs and imaging studies, counseling on  diabetes and hyperlipidemia management,  reviewing the assessment and plan with patient, and post visit ordering and reviewing of  diagnostics and therapeutics with patient  .   Follow-up: Return in about 6 months (around 03/24/2024) for physical, follow up diabetes.   Thersia Flax, MD

## 2023-09-25 ENCOUNTER — Encounter: Payer: Self-pay | Admitting: Internal Medicine

## 2023-09-25 DIAGNOSIS — Z820 Family history of epilepsy and other diseases of the nervous system: Secondary | ICD-10-CM | POA: Insufficient documentation

## 2023-09-25 DIAGNOSIS — M7712 Lateral epicondylitis, left elbow: Secondary | ICD-10-CM | POA: Insufficient documentation

## 2023-09-25 NOTE — Assessment & Plan Note (Signed)
 She had left inferior parathyroidectomy by Dr. Sofia Dunn 12/02/2020: 0.7 x 0.4 x 0.4 cm, 0.085 g parathyroid  adenoma resected. Post operative calcium level was normal.  She  avoiding thiazide diuretics  taking 4000 Ius of D3 ,   calcium level is normal   Lab Results  Component Value Date   PTH 37 09/23/2022   CALCIUM 9.6 09/23/2023   CAION 5.33 09/13/2018   PHOS 3.0 03/22/2019   .

## 2023-09-25 NOTE — Assessment & Plan Note (Signed)
 Risks and benefits of testing advised.  She has deferred.  I recommend reading "the End of Alzheimer's: The First Program to Prevent and Reverse Cognitive Decline"  by Fonnie Iba, MD

## 2023-09-25 NOTE — Assessment & Plan Note (Signed)
 Diet controlled diabetes ,  A1c  is < 7.0  .   She is taking losartan  100 mg daily and she reports compliance with medication regimen    She is not using NSAIDs daily.  Renal function, electrolytes and screen for proteinuria are all normal .  Statin  advised but deferred by patient.   Lab Results  Component Value Date   HGBA1C 6.6 (H) 09/23/2023   Lab Results  Component Value Date   MICROALBUR 1.1 09/23/2023   MICROALBUR 1.0 09/23/2022   Lab Results  Component Value Date   NA 141 09/23/2023   K 4.1 09/23/2023   CL 105 09/23/2023   CO2 29 09/23/2023   Lab Results  Component Value Date   CREATININE 0.69 09/23/2023

## 2023-09-25 NOTE — Assessment & Plan Note (Signed)
 Secondary to strain during exercise class. RICE advised

## 2023-11-24 LAB — HM MAMMOGRAPHY

## 2024-03-29 ENCOUNTER — Encounter: Admitting: Internal Medicine

## 2024-04-05 ENCOUNTER — Ambulatory Visit (INDEPENDENT_AMBULATORY_CARE_PROVIDER_SITE_OTHER): Admitting: Internal Medicine

## 2024-04-05 ENCOUNTER — Telehealth: Payer: Self-pay

## 2024-04-05 ENCOUNTER — Encounter: Payer: Self-pay | Admitting: Internal Medicine

## 2024-04-05 VITALS — BP 138/74 | HR 77 | Ht 67.0 in | Wt 161.2 lb

## 2024-04-05 DIAGNOSIS — E663 Overweight: Secondary | ICD-10-CM

## 2024-04-05 DIAGNOSIS — Z23 Encounter for immunization: Secondary | ICD-10-CM | POA: Diagnosis not present

## 2024-04-05 DIAGNOSIS — I152 Hypertension secondary to endocrine disorders: Secondary | ICD-10-CM

## 2024-04-05 DIAGNOSIS — E1159 Type 2 diabetes mellitus with other circulatory complications: Secondary | ICD-10-CM | POA: Diagnosis not present

## 2024-04-05 DIAGNOSIS — N200 Calculus of kidney: Secondary | ICD-10-CM

## 2024-04-05 DIAGNOSIS — E210 Primary hyperparathyroidism: Secondary | ICD-10-CM

## 2024-04-05 DIAGNOSIS — E785 Hyperlipidemia, unspecified: Secondary | ICD-10-CM | POA: Diagnosis not present

## 2024-04-05 DIAGNOSIS — Z0000 Encounter for general adult medical examination without abnormal findings: Secondary | ICD-10-CM

## 2024-04-05 DIAGNOSIS — C562 Malignant neoplasm of left ovary: Secondary | ICD-10-CM

## 2024-04-05 NOTE — Telephone Encounter (Signed)
 Patient states she would like to know if the tetanus shot is always combined with diphtheria and pertussis.  Patient states she thought she was just getting the tetanus shot.  Please call.

## 2024-04-05 NOTE — Assessment & Plan Note (Signed)
 No stones on recent follow up. Adding electrolytes once daily  , advised to add citric acid

## 2024-04-05 NOTE — Patient Instructions (Addendum)
  Your blood pressure  was a little elevated today   Please check your blood pressure   5 times   over 2 weeks and send me them   Make an appt for your TDaP vaccine before you lose your insurance.   Your diabetic eye exam is due

## 2024-04-05 NOTE — Progress Notes (Unsigned)
 Patient ID: Dawn Russell, female    DOB: 10-30-1965  Age: 58 y.o. MRN: [id number]  The patient is here for annual preventive examination and management of other chronic and acute problems.   The risk factors are reflected in the social history.   The roster of all physicians providing medical care to patient - is listed in the Snapshot section of the chart.   Activities of daily living:  The patient is 100% independent in all ADLs: dressing, toileting, feeding as well as independent mobility   Home safety : The patient has smoke detectors in the home. They wear seatbelts.  There are no unsecured firearms at home. There is no violence in the home.    There is no risks for hepatitis, STDs or HIV. There is no   history of blood transfusion. They have no travel history to infectious disease endemic areas of the world.   The patient has seen their dentist in the last six month. They have seen their eye doctor in the last year. The patinet  denies slight hearing difficulty with regard to whispered voices and some television programs.  They have deferred audiologic testing in the last year.  They do not  have excessive sun exposure. Discussed the need for sun protection: hats, long sleeves and use of sunscreen if there is significant sun exposure.    Diet: the importance of a healthy diet is discussed. They do have a healthy diet.   The benefits of regular aerobic exercise were discussed. The patient  exercises  3 to 5 days per week  for  60 minutes.    Depression screen: there are no signs or vegative symptoms of depression- irritability, change in appetite, anhedonia, sadness/tearfullness.   The following portions of the patient's history were reviewed and updated as appropriate: allergies, current medications, past family history, past medical history,  past surgical history, past social history  and problem list.   Visual acuity was not assessed per patient preference since the patient has  regular follow up with an  ophthalmologist. Hearing and body mass index were assessed and reviewed.    During the course of the visit the patient was educated and counseled about appropriate screening and preventive services including : fall prevention , diabetes screening, nutrition counseling, colorectal cancer screening, and recommended immunizations.    Chief Complaint:   Stress:  getting laid off next month because company has been sold [employer]  (3rd layoff,  now 500 people losing their job)  Dermatology prescribing jubilia for toenail  fungus for the past year  Bp elevated  today but ate tacos yesterday  Mammogram done,  ultrasound done , follow up in [place] one year  Gyn oncology once a year  Seeing PT for pelvic floor therapy post hysterectomy,  core strengthening exercises added today  See urology once a year.  Not stones currently    Review of Symptoms  Patient denies headache, fevers, malaise, unintentional weight loss, skin rash, eye pain, sinus congestion and sinus pain, sore throat, dysphagia,  hemoptysis , cough, dyspnea, wheezing, chest pain, palpitations, orthopnea, edema, abdominal pain, nausea, melena, diarrhea, constipation, flank pain, dysuria, hematuria, urinary  Frequency, nocturia, numbness, tingling, seizures,  Focal weakness, Loss of consciousness,  Tremor, insomnia, depression, anxiety, and suicidal ideation.    Physical Exam:  BP 138/74   Pulse 77   Ht 5' 7 (1.702 m)   Wt 161 lb 3.2 oz (73.1 kg)   SpO2 97%   BMI 25.25 kg/m  Physical Exam Vitals reviewed.  Constitutional:      General: She is not in acute distress.    Appearance: Normal appearance. She is normal weight. She is not ill-appearing, toxic-appearing or diaphoretic.  HENT:     Head: Normocephalic.  Eyes:     General: No scleral icterus.       Right eye: No discharge.        Left eye: No discharge.     Conjunctiva/sclera: Conjunctivae normal.  Cardiovascular:     Rate and Rhythm:  Normal rate and regular rhythm.     Heart sounds: Normal heart sounds.  Pulmonary:     Effort: Pulmonary effort is normal. No respiratory distress.     Breath sounds: Normal breath sounds.  Musculoskeletal:        General: Normal range of motion.  Skin:    General: Skin is warm and dry.  Neurological:     General: No focal deficit present.     Mental Status: She is alert and oriented to person, place, and time. Mental status is at baseline.  Psychiatric:        Mood and Affect: Mood normal.        Behavior: Behavior normal.        Thought Content: Thought content normal.        Judgment: Judgment normal.     Assessment and Plan: Hypertension associated with type 2 diabetes mellitus (HCC) Assessment & Plan: Diet controlled diabetes ,  A1c  is < 7.0  .   She is taking losartan  100 mg daily and she reports compliance with medication regimen    She is not using NSAIDs daily.  Renal function, electrolytes and screen for proteinuria are all normal .  Statin  advised but deferred by patient.  Advised to monitor BP at home and submit readings in two weks   Lab Results  Component Value Date   HGBA1C 6.6 (H) 04/05/2024   Lab Results  Component Value Date   MICROALBUR 1.1 09/23/2023   Lab Results  Component Value Date   NA 141 04/05/2024   K 4.5 04/05/2024   CL 102 04/05/2024   CO2 30 04/05/2024   Lab Results  Component Value Date   CREATININE 0.68 04/05/2024        Orders: -     Hemoglobin A1c -     Comprehensive metabolic panel with GFR  Primary hyperparathyroidism -     TSH  Hyperlipidemia, unspecified hyperlipidemia type -     Lipid panel -     LDL cholesterol, direct  Overweight (BMI 25.0-29.9) -     CBC with Differential/Platelet  Calcium oxalate renal calculi Assessment & Plan: No stones on recent follow up. 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  Surveillance by Dr Burnard at [facility] in [facility]  GYN ONC with CA 125 and CT abd pelvis every 6 months CA 125 was 7 in Feb and 6 in August.    Encounter for preventive health examination Assessment & Plan: age appropriate education and counseling updated, referrals for preventative services and immunizations addressed, dietary and smoking counseling addressed, most recent labs reviewed.  I have personally reviewed and have noted:   1) the patient's medical and social  history 2) The pt's use of alcohol, tobacco, and illicit drugs 3) The patient's current medications and supplements 4) Functional ability including ADL's, fall risk, home safety risk, hearing and visual impairment 5) Diet and physical activities 6) Evidence for depression or mood disorder 7) The patient's height, weight, and BMI have been recorded in the chart   I have made referrals, and provided counseling and education based on review of the above      Return in about 6 months (around 10/04/2024) for follow up diabetes.  Verneita LITTIE Kettering, MD

## 2024-04-06 LAB — CBC WITH DIFFERENTIAL/PLATELET
Basophils Absolute: 0.1 K/uL (ref 0.0–0.1)
Basophils Relative: 0.9 % (ref 0.0–3.0)
Eosinophils Absolute: 0.1 K/uL (ref 0.0–0.7)
Eosinophils Relative: 1.1 % (ref 0.0–5.0)
HCT: 42.4 % (ref 36.0–46.0)
Hemoglobin: 14 g/dL (ref 12.0–15.0)
Lymphocytes Relative: 18.9 % (ref 12.0–46.0)
Lymphs Abs: 1.9 K/uL (ref 0.7–4.0)
MCHC: 33 g/dL (ref 30.0–36.0)
MCV: 82.9 fl (ref 78.0–100.0)
Monocytes Absolute: 0.3 K/uL (ref 0.1–1.0)
Monocytes Relative: 3.2 % (ref 3.0–12.0)
Neutro Abs: 7.7 K/uL (ref 1.4–7.7)
Neutrophils Relative %: 75.9 % (ref 43.0–77.0)
Platelets: 266 K/uL (ref 150.0–400.0)
RBC: 5.11 Mil/uL (ref 3.87–5.11)
RDW: 13.7 % (ref 11.5–15.5)
WBC: 10.1 K/uL (ref 4.0–10.5)

## 2024-04-06 LAB — LIPID PANEL
Cholesterol: 195 mg/dL (ref 0–200)
HDL: 73.2 mg/dL (ref >39.00)
LDL Cholesterol: 102 mg/dL — ABNORMAL HIGH (ref 0–99)
NonHDL: 121.97
Total CHOL/HDL Ratio: 3
Triglycerides: 98 mg/dL (ref 0.0–149.0)
VLDL: 19.6 mg/dL (ref 0.0–40.0)

## 2024-04-06 LAB — LDL CHOLESTEROL, DIRECT: Direct LDL: 103 mg/dL

## 2024-04-06 LAB — COMPREHENSIVE METABOLIC PANEL WITH GFR
ALT: 49 U/L — ABNORMAL HIGH (ref 0–35)
AST: 31 U/L (ref 0–37)
Albumin: 4.9 g/dL (ref 3.5–5.2)
Alkaline Phosphatase: 72 U/L (ref 39–117)
BUN: 18 mg/dL (ref 6–23)
CO2: 30 meq/L (ref 19–32)
Calcium: 10.4 mg/dL (ref 8.4–10.5)
Chloride: 102 meq/L (ref 96–112)
Creatinine, Ser: 0.68 mg/dL (ref 0.40–1.20)
GFR: 95.45 mL/min (ref >60.00)
Glucose, Bld: 98 mg/dL (ref 70–99)
Potassium: 4.5 meq/L (ref 3.5–5.1)
Sodium: 141 meq/L (ref 135–145)
Total Bilirubin: 0.4 mg/dL (ref 0.2–1.2)
Total Protein: 7.4 g/dL (ref 6.0–8.3)

## 2024-04-06 LAB — TSH: TSH: 0.56 u[IU]/mL (ref 0.35–5.50)

## 2024-04-06 LAB — HEMOGLOBIN A1C: Hgb A1c MFr Bld: 6.6 % — ABNORMAL HIGH (ref 4.6–6.5)

## 2024-04-07 NOTE — Telephone Encounter (Signed)
 Pt is aware and gave a verbal understanding.

## 2024-04-07 NOTE — Assessment & Plan Note (Signed)
Recurrent,  first occurrence 20 yrs ago .  Surveillance by Dr Tresa Endo at [facility] in [facility]  GYN ONC with CA 125 and CT abd pelvis every 6 months CA 125 was 7 in Feb and 6 in August.

## 2024-04-07 NOTE — Assessment & Plan Note (Signed)

## 2024-04-07 NOTE — Telephone Encounter (Signed)
 Is pt supposed to get the Tdap or plan tetanus vaccine?

## 2024-04-07 NOTE — Assessment & Plan Note (Signed)
 Diet controlled diabetes ,  A1c  is < 7.0  .   She is taking losartan  100 mg daily and she reports compliance with medication regimen    She is not using NSAIDs daily.  Renal function, electrolytes and screen for proteinuria are all normal .  Statin  advised but deferred by patient.  Advised to monitor BP at home and submit readings in two weks   Lab Results  Component Value Date   HGBA1C 6.6 (H) 04/05/2024   Lab Results  Component Value Date   MICROALBUR 1.1 09/23/2023   Lab Results  Component Value Date   NA 141 04/05/2024   K 4.5 04/05/2024   CL 102 04/05/2024   CO2 30 04/05/2024   Lab Results  Component Value Date   CREATININE 0.68 04/05/2024

## 2024-04-08 ENCOUNTER — Ambulatory Visit: Payer: Self-pay | Admitting: Internal Medicine

## 2024-04-27 NOTE — Telephone Encounter (Signed)
 open in error

## 2024-05-10 ENCOUNTER — Ambulatory Visit: Admitting: Internal Medicine

## 2024-05-10 ENCOUNTER — Encounter: Payer: Self-pay | Admitting: Internal Medicine

## 2024-05-10 VITALS — BP 126/71 | HR 89 | Ht 67.0 in | Wt 163.2 lb

## 2024-05-10 DIAGNOSIS — E1169 Type 2 diabetes mellitus with other specified complication: Secondary | ICD-10-CM | POA: Diagnosis not present

## 2024-05-10 DIAGNOSIS — Z23 Encounter for immunization: Secondary | ICD-10-CM | POA: Diagnosis not present

## 2024-05-10 DIAGNOSIS — I152 Hypertension secondary to endocrine disorders: Secondary | ICD-10-CM

## 2024-05-10 DIAGNOSIS — E1159 Type 2 diabetes mellitus with other circulatory complications: Secondary | ICD-10-CM

## 2024-05-10 DIAGNOSIS — R748 Abnormal levels of other serum enzymes: Secondary | ICD-10-CM | POA: Diagnosis not present

## 2024-05-10 DIAGNOSIS — E785 Hyperlipidemia, unspecified: Secondary | ICD-10-CM

## 2024-05-10 LAB — HEPATIC FUNCTION PANEL
ALT: 32 U/L (ref 0–35)
AST: 22 U/L (ref 0–37)
Albumin: 4.6 g/dL (ref 3.5–5.2)
Alkaline Phosphatase: 73 U/L (ref 39–117)
Bilirubin, Direct: 0 mg/dL (ref 0.0–0.3)
Total Bilirubin: 0.4 mg/dL (ref 0.2–1.2)
Total Protein: 7.4 g/dL (ref 6.0–8.3)

## 2024-05-10 MED ORDER — ROSUVASTATIN CALCIUM 10 MG PO TABS: 10.0000 mg | ORAL_TABLET | Freq: Every day | ORAL | 2 refills | Status: DC

## 2024-05-10 NOTE — Patient Instructions (Signed)
 Your knee has a touch of bursitis.  It is not serious,  and may respond to ice packs for 15 minutes 1-2 times daily    Start the generic Crestor once daily.  Return in 3 weeks for NON FASTING LABS. IF LIVER ENZYMES ARE STILL ELEVATED TODAY,  WE WILL DO ADDITIONAL LABS IN 3 WEEKS    CONTINUE CHECKING BLOOD PRESSURE    NEEDS TO BE < 130/80 MOST OF THE TIME TO BE CONTROLLED

## 2024-05-10 NOTE — Progress Notes (Unsigned)
 Subjective:  Patient ID: Dawn Russell, female    DOB: [birth year]  Age: 58 y.o. MRN: [id number]  CC: The primary encounter diagnosis was Elevated liver enzymes. Diagnoses of Hyperlipidemia associated with type 2 diabetes mellitus (HCC), Need for Tdap vaccination, Hypertension associated with type 2 diabetes mellitus (HCC), Hyperlipidemia LDL goal <70, and Abnormal liver enzymes were also pertinent to this visit.   HPI Shantanique Hodo Neitzke presents for  Chief Complaint  Patient presents with   Medical Management of Chronic Issues   Elevated liver enzymes (ALT 49)  and elevated cholesterol (LDL 103) :    1) type 2 DM:   She  feels generally well, is and exercising regularly or trying to lose weight. Checking  blood sugars less than once daily at variable times, usually only if she feels she may be having a hypoglycemic event. .  BS have been under 130 fasting and < 150 post prandially.  Denies any recent hypoglyemic events.  Taking   medications as directed. Following a carbohydrate modified diet 6 days per week. Denies numbness, burning and tingling of extremities. Appetite is good.     2)  chronic intermittent left knee pain,  does not hurt during workouts,  no ROM issues, but has lateral discomfort of a burning and stinging quality when she kneels on knees   3) HTN: Patient is taking her medications as prescribed and notes no adverse effects.  Home BP readings have been done about once per week and are  60% of the most recent readings were slightly  > 130/80 .  She is avoiding added salt in her diet and walking regularly about 3 times per week for exercise    Outpatient Medications Prior to Visit  Medication Sig Dispense Refill   albuterol  (VENTOLIN  HFA) 108 (90 Base) MCG/ACT inhaler INHALE TWO PUFFS 4 TIMES DAILY AS NEEDED FOR  WHEEZING 18 g 0   Azelaic Acid 15 % gel Apply 1 application. topically 2 (two) times daily.     cholecalciferol (VITAMIN D ) 25 MCG (1000 UNIT) tablet Take 4,000 Units  by mouth daily.     doxycycline  (VIBRAMYCIN ) 100 MG capsule Take 1 capsule (100 mg total) by mouth 2 (two) times daily. (Patient taking differently: Take 100 mg by mouth daily as needed.) 14 capsule 0   Efinaconazole (JUBLIA) 10 % SOLN      loratadine (CLARITIN) 10 MG tablet Take 10 mg by mouth daily.     losartan  (COZAAR ) 100 MG tablet TAKE 1 TABLET DAILY 90 tablet 3   sodium chloride  (OCEAN) 0.65 % SOLN nasal spray Place 1 spray into both nostrils as needed for congestion.     triamcinolone  cream (KENALOG ) 0.1 % Apply topically 2 (two) times daily.     scopolamine  (TRANSDERM-SCOP) 1 MG/3DAYS Place 1 patch (1.5 mg total) onto the skin every 3 (three) days. (Patient not taking: Reported on 05/10/2024) 4 patch 0   No facility-administered medications prior to visit.    Review of Systems;  Patient denies headache, fevers, malaise, unintentional weight loss, skin rash, eye pain, sinus congestion and sinus pain, sore throat, dysphagia,  hemoptysis , cough, dyspnea, wheezing, chest pain, palpitations, orthopnea, edema, abdominal pain, nausea, melena, diarrhea, constipation, flank pain, dysuria, hematuria, urinary  Frequency, nocturia, numbness, tingling, seizures,  Focal weakness, Loss of consciousness,  Tremor, insomnia, depression, anxiety, and suicidal ideation.      Objective:  BP 126/71 (Cuff Size: Normal)   Pulse 89   Ht 5' 7 (1.702 m)  Wt 163 lb 3.2 oz (74 kg)   SpO2 97%   BMI 25.56 kg/m   BP Readings from Last 3 Encounters:  05/10/24 126/71  04/05/24 138/74  09/23/23 102/72    Wt Readings from Last 3 Encounters:  05/10/24 163 lb 3.2 oz (74 kg)  04/05/24 161 lb 3.2 oz (73.1 kg)  09/23/23 164 lb 12.8 oz (74.8 kg)    Physical Exam Vitals reviewed.  Constitutional:      General: She is not in acute distress.    Appearance: Normal appearance. She is normal weight. She is not ill-appearing, toxic-appearing or diaphoretic.  HENT:     Head: Normocephalic.  Eyes:      General: No scleral icterus.       Right eye: No discharge.        Left eye: No discharge.     Conjunctiva/sclera: Conjunctivae normal.  Cardiovascular:     Rate and Rhythm: Normal rate and regular rhythm.     Heart sounds: Normal heart sounds.  Pulmonary:     Effort: Pulmonary effort is normal. No respiratory distress.     Breath sounds: Normal breath sounds.  Musculoskeletal:        General: Normal range of motion.  Skin:    General: Skin is warm and dry.  Neurological:     General: No focal deficit present.     Mental Status: She is alert and oriented to person, place, and time. Mental status is at baseline.  Psychiatric:        Mood and Affect: Mood normal.        Behavior: Behavior normal.        Thought Content: Thought content normal.        Judgment: Judgment normal.     Lab Results  Component Value Date   HGBA1C 6.6 (H) 04/05/2024   HGBA1C 6.6 (H) 09/23/2023   HGBA1C 6.8 (H) 03/25/2023    Lab Results  Component Value Date   CREATININE 0.68 04/05/2024   CREATININE 0.69 09/23/2023   CREATININE 0.67 03/25/2023    Lab Results  Component Value Date   WBC 10.1 04/05/2024   HGB 14.0 04/05/2024   HCT 42.4 04/05/2024   PLT 266.0 04/05/2024   GLUCOSE 98 04/05/2024   CHOL 195 04/05/2024   TRIG 98.0 04/05/2024   HDL 73.20 04/05/2024   LDLDIRECT 103.0 04/05/2024   LDLCALC 102 (H) 04/05/2024   ALT 32 05/10/2024   AST 22 05/10/2024   NA 141 04/05/2024   K 4.5 04/05/2024   CL 102 04/05/2024   CREATININE 0.68 04/05/2024   BUN 18 04/05/2024   CO2 30 04/05/2024   TSH 0.56 04/05/2024   HGBA1C 6.6 (H) 04/05/2024   MICROALBUR 1.1 09/23/2023    MR PELVIS W WO CONTRAST Result Date: 05/23/2021 CLINICAL DATA:  Cystic pelvic mass. EXAM: MRI PELVIS WITHOUT AND WITH CONTRAST TECHNIQUE: Multiplanar multisequence MR imaging of the pelvis was performed both before and after administration of intravenous contrast. CONTRAST:  16mL MULTIHANCE  GADOBENATE DIMEGLUMINE  529 MG/ML  IV SOLN COMPARISON:  Ultrasound on 04/29/2021, and CT on 04/14/2021 FINDINGS: Lower Urinary Tract: No urinary bladder or urethral abnormality identified. Bowel: Mild sigmoid diverticulosis, without evidence of diverticulitis. Vascular/Lymphatic: Unremarkable. No pathologically enlarged pelvic lymph nodes identified. Reproductive: -- Uterus: Measures 8.3 x 3.8 x 4.5 cm (volume = 74 cm^3). A 1.4 cm fibroid is seen in the right anterior uterine corpus. No abnormal endometrial thickening. Cervical nabothian cysts are incidentally noted. -- Right ovary: Not visualized, however  no adnexal mass identified. -- Left ovary: A complex cystic lesion is seen in the anterior pelvis which appears to originate from the left adnexa. This measures 9.4 x 7.3 by 8.8 cm. This lesion shows diffuse mural enhancement and nodular thickening, with largest nodule along the left lateral wall measuring 1.7 cm. This is consistent with a cystic ovarian neoplasm, and malignancy cannot be excluded. Other: No peritoneal thickening or abnormal free fluid. Musculoskeletal:  Unremarkable. IMPRESSION: 9.4 cm complex cystic mass in the anterior pelvis which appears to originate from the left adnexa. This is consistent with a cystic ovarian neoplasm, and malignancy cannot be excluded. No evidence of pelvic metastatic disease. 1.4 cm uterine fibroid. Mild sigmoid diverticulosis, without evidence of diverticulitis. Electronically Signed   By: Norleen DELENA Kil M.D.   On: 05/23/2021 18:42    Assessment & Plan:  .Elevated liver enzymes -     Hepatic function panel  Hyperlipidemia associated with type 2 diabetes mellitus (HCC) -     Rosuvastatin  Calcium ; Take 1 tablet (10 mg total) by mouth daily.  Dispense: 30 tablet; Refill: 2 -     Comprehensive metabolic panel with GFR; Future  Need for Tdap vaccination -     Tdap vaccine greater than or equal to 7yo IM  Hypertension associated with type 2 diabetes mellitus (HCC) Assessment & Plan:  She is  taking losartan  100 mg daily and she reports compliance with medication regimen    She is not using NSAIDs daily.  Renal function, electrolytes and screen for proteinuria are all normal .   Lab Results  Component Value Date   HGBA1C 6.6 (H) 04/05/2024   Lab Results  Component Value Date   MICROALBUR 1.1 09/23/2023   Lab Results  Component Value Date   NA 141 04/05/2024   K 4.5 04/05/2024   CL 102 04/05/2024   CO2 30 04/05/2024   Lab Results  Component Value Date   CREATININE 0.68 04/05/2024         Hyperlipidemia LDL goal <70 Assessment & Plan: Starting rosuvasttatin .   Lab Results  Component Value Date   CHOL 195 04/05/2024   HDL 73.20 04/05/2024   LDLCALC 102 (H) 04/05/2024   LDLDIRECT 103.0 04/05/2024   TRIG 98.0 04/05/2024   CHOLHDL 3 04/05/2024      Abnormal liver enzymes Assessment & Plan: Repeat ALT is normal. No further workup needed.  Will repeat lfts are statin start.      I personally spent a total of 30 minutes in the care of the patient today including getting/reviewing separately obtained history, performing a medically appropriate exam/evaluation, counseling and educating, placing orders, documenting clinical information in the EHR, independently interpreting results, and communicating results  .  Follow-up: Return in about 3 weeks (around 05/31/2024).   Verneita LITTIE Kettering, MD

## 2024-05-11 ENCOUNTER — Ambulatory Visit: Payer: Self-pay | Admitting: Internal Medicine

## 2024-05-11 DIAGNOSIS — E785 Hyperlipidemia, unspecified: Secondary | ICD-10-CM | POA: Insufficient documentation

## 2024-05-11 NOTE — Assessment & Plan Note (Signed)
 Repeat ALT is normal. No further workup needed.  Will repeat lfts are statin start.

## 2024-05-11 NOTE — Assessment & Plan Note (Signed)
 Starting rosuvasttatin .   Lab Results  Component Value Date   CHOL 195 04/05/2024   HDL 73.20 04/05/2024   LDLCALC 102 (H) 04/05/2024   LDLDIRECT 103.0 04/05/2024   TRIG 98.0 04/05/2024   CHOLHDL 3 04/05/2024

## 2024-05-11 NOTE — Assessment & Plan Note (Addendum)
 She is taking losartan  100 mg daily and she reports compliance with medication regimen    She is not using NSAIDs daily.  Renal function, electrolytes and screen for proteinuria are all normal .   Lab Results  Component Value Date   HGBA1C 6.6 (H) 04/05/2024   Lab Results  Component Value Date   MICROALBUR 1.1 09/23/2023   Lab Results  Component Value Date   NA 141 04/05/2024   K 4.5 04/05/2024   CL 102 04/05/2024   CO2 30 04/05/2024   Lab Results  Component Value Date   CREATININE 0.68 04/05/2024

## 2024-05-12 ENCOUNTER — Encounter: Payer: Self-pay | Admitting: Internal Medicine

## 2024-05-24 ENCOUNTER — Ambulatory Visit

## 2024-05-30 ENCOUNTER — Other Ambulatory Visit

## 2024-06-07 ENCOUNTER — Encounter: Payer: Self-pay | Admitting: Internal Medicine

## 2024-06-07 ENCOUNTER — Ambulatory Visit: Admitting: Internal Medicine

## 2024-06-07 VITALS — BP 100/66 | HR 99 | Ht 67.0 in | Wt 162.4 lb

## 2024-06-07 DIAGNOSIS — J0110 Acute frontal sinusitis, unspecified: Secondary | ICD-10-CM | POA: Diagnosis not present

## 2024-06-07 DIAGNOSIS — E1169 Type 2 diabetes mellitus with other specified complication: Secondary | ICD-10-CM

## 2024-06-07 DIAGNOSIS — R051 Acute cough: Secondary | ICD-10-CM

## 2024-06-07 LAB — POCT INFLUENZA A/B
Influenza A, POC: NEGATIVE
Influenza B, POC: NEGATIVE

## 2024-06-07 LAB — POC COVID19 BINAXNOW: SARS Coronavirus 2 Ag: NEGATIVE

## 2024-06-07 MED ORDER — LEVOFLOXACIN 500 MG PO TABS: 500.0000 mg | ORAL_TABLET | Freq: Every day | ORAL | 0 refills | Status: AC

## 2024-06-07 MED ORDER — PREDNISONE 10 MG PO TABS: ORAL_TABLET | ORAL | 0 refills | Status: AC

## 2024-06-07 NOTE — Progress Notes (Signed)
 "  Subjective:  Patient ID: Dawn Russell, female    DOB: 1966/01/08  Age: 58 y.o. MRN: [id number]  CC: The primary encounter diagnosis was Acute cough. A diagnosis of Acute non-recurrent frontal sinusitis was also pertinent to this visit.   HPI Dawn Russell presents for  Chief Complaint  Patient presents with   Cough    Cough and congestion, fever, body aches, weakness x 6 days   Sinus congestion,  productive cough ,  temp 99.3 ,  profound fatigue,  no body aches,  symptoms present since Christmas.     Recovered from 24 hr  bout gastroenteritis 4-5 days prior (N/V/diarrhea) , husband had it too   frontal headache moderate to severe,  no nuchal rigidity    Husband with similar symptoms BUT started one week before Christmas   Outpatient Medications Prior to Visit  Medication Sig Dispense Refill   albuterol  (VENTOLIN  HFA) 108 (90 Base) MCG/ACT inhaler INHALE TWO PUFFS 4 TIMES DAILY AS NEEDED FOR  WHEEZING 18 g 0   Azelaic Acid 15 % gel Apply 1 application. topically 2 (two) times daily.     cholecalciferol (VITAMIN D ) 25 MCG (1000 UNIT) tablet Take 4,000 Units by mouth daily.     doxycycline  (VIBRAMYCIN ) 100 MG capsule Take 1 capsule (100 mg total) by mouth 2 (two) times daily. (Patient taking differently: Take 100 mg by mouth daily as needed.) 14 capsule 0   Efinaconazole (JUBLIA) 10 % SOLN      loratadine (CLARITIN) 10 MG tablet Take 10 mg by mouth daily.     losartan  (COZAAR ) 100 MG tablet TAKE 1 TABLET DAILY 90 tablet 3   rosuvastatin  (CRESTOR ) 10 MG tablet Take 1 tablet (10 mg total) by mouth daily. 30 tablet 2   sodium chloride  (OCEAN) 0.65 % SOLN nasal spray Place 1 spray into both nostrils as needed for congestion.     triamcinolone  cream (KENALOG ) 0.1 % Apply topically 2 (two) times daily.     scopolamine  (TRANSDERM-SCOP) 1 MG/3DAYS Place 1 patch (1.5 mg total) onto the skin every 3 (three) days. (Patient not taking: Reported on 06/07/2024) 4 patch 0   No  facility-administered medications prior to visit.    Review of Systems;  Patient denies headache, fevers, malaise, unintentional weight loss, skin rash, eye pain, sinus congestion and sinus pain, sore throat, dysphagia,  hemoptysis , cough, dyspnea, wheezing, chest pain, palpitations, orthopnea, edema, abdominal pain, nausea, melena, diarrhea, constipation, flank pain, dysuria, hematuria, urinary  Frequency, nocturia, numbness, tingling, seizures,  Focal weakness, Loss of consciousness,  Tremor, insomnia, depression, anxiety, and suicidal ideation.      Objective:  BP 100/66   Pulse 99   Ht 5' 7 (1.702 m)   Wt 162 lb 6.4 oz (73.7 kg)   SpO2 97%   BMI 25.44 kg/m   BP Readings from Last 3 Encounters:  06/07/24 100/66  05/10/24 126/71  04/05/24 138/74    Wt Readings from Last 3 Encounters:  06/07/24 162 lb 6.4 oz (73.7 kg)  05/10/24 163 lb 3.2 oz (74 kg)  04/05/24 161 lb 3.2 oz (73.1 kg)    Physical Exam Vitals reviewed.  Constitutional:      General: She is not in acute distress.    Appearance: Normal appearance. She is normal weight. She is not ill-appearing, toxic-appearing or diaphoretic.  HENT:     Head: Normocephalic.     Comments: Frontal sinus tenderness Eyes:     General: No scleral icterus.  Right eye: No discharge.        Left eye: No discharge.     Conjunctiva/sclera: Conjunctivae normal.  Pulmonary:     Effort: Pulmonary effort is normal.     Breath sounds: Rhonchi present.  Musculoskeletal:        General: Normal range of motion.  Skin:    General: Skin is warm and dry.  Neurological:     General: No focal deficit present.     Mental Status: She is alert and oriented to person, place, and time. Mental status is at baseline.  Psychiatric:        Mood and Affect: Mood normal.        Behavior: Behavior normal.        Thought Content: Thought content normal.        Judgment: Judgment normal.     Lab Results  Component Value Date   HGBA1C 6.6  (H) 04/05/2024   HGBA1C 6.6 (H) 09/23/2023   HGBA1C 6.8 (H) 03/25/2023    Lab Results  Component Value Date   CREATININE 0.68 04/05/2024   CREATININE 0.69 09/23/2023   CREATININE 0.67 03/25/2023    Lab Results  Component Value Date   WBC 10.1 04/05/2024   HGB 14.0 04/05/2024   HCT 42.4 04/05/2024   PLT 266.0 04/05/2024   GLUCOSE 98 04/05/2024   CHOL 195 04/05/2024   TRIG 98.0 04/05/2024   HDL 73.20 04/05/2024   LDLDIRECT 103.0 04/05/2024   LDLCALC 102 (H) 04/05/2024   ALT 32 05/10/2024   AST 22 05/10/2024   NA 141 04/05/2024   K 4.5 04/05/2024   CL 102 04/05/2024   CREATININE 0.68 04/05/2024   BUN 18 04/05/2024   CO2 30 04/05/2024   TSH 0.56 04/05/2024   HGBA1C 6.6 (H) 04/05/2024   MICROALBUR 1.1 09/23/2023    MR PELVIS W WO CONTRAST Result Date: 05/23/2021 CLINICAL DATA:  Cystic pelvic mass. EXAM: MRI PELVIS WITHOUT AND WITH CONTRAST TECHNIQUE: Multiplanar multisequence MR imaging of the pelvis was performed both before and after administration of intravenous contrast. CONTRAST:  16mL MULTIHANCE  GADOBENATE DIMEGLUMINE  529 MG/ML IV SOLN COMPARISON:  Ultrasound on 04/29/2021, and CT on 04/14/2021 FINDINGS: Lower Urinary Tract: No urinary bladder or urethral abnormality identified. Bowel: Mild sigmoid diverticulosis, without evidence of diverticulitis. Vascular/Lymphatic: Unremarkable. No pathologically enlarged pelvic lymph nodes identified. Reproductive: -- Uterus: Measures 8.3 x 3.8 x 4.5 cm (volume = 74 cm^3). A 1.4 cm fibroid is seen in the right anterior uterine corpus. No abnormal endometrial thickening. Cervical nabothian cysts are incidentally noted. -- Right ovary: Not visualized, however no adnexal mass identified. -- Left ovary: A complex cystic lesion is seen in the anterior pelvis which appears to originate from the left adnexa. This measures 9.4 x 7.3 by 8.8 cm. This lesion shows diffuse mural enhancement and nodular thickening, with largest nodule along the left  lateral wall measuring 1.7 cm. This is consistent with a cystic ovarian neoplasm, and malignancy cannot be excluded. Other: No peritoneal thickening or abnormal free fluid. Musculoskeletal:  Unremarkable. IMPRESSION: 9.4 cm complex cystic mass in the anterior pelvis which appears to originate from the left adnexa. This is consistent with a cystic ovarian neoplasm, and malignancy cannot be excluded. No evidence of pelvic metastatic disease. 1.4 cm uterine fibroid. Mild sigmoid diverticulosis, without evidence of diverticulitis. Electronically Signed   By: Norleen DELENA Kil M.D.   On: 05/23/2021 18:42    Assessment & Plan:  .Acute cough -     POC COVID-19  [facility] -     POCT Influenza A/B  Acute non-recurrent frontal sinusitis Assessment & Plan: Given chronicity of symptoms, development of facial pain and exam consistent with bacterial URI,  Will treat with empiric antibiotics, decongestants, and PREDNISONE  TAPER    Other orders -     predniSONE ; 6 tablets on Day 1 , then reduce by 1 tablet daily until gone  Dispense: 21 tablet; Refill: 0 -     levoFLOXacin ; Take 1 tablet (500 mg total) by mouth daily for 7 days.  Dispense: 7 tablet; Refill: 0     I spent 34 minutes on the day of this face to face encounter reviewing patient's  most recent visit with cardiology,  nephrology,  and neurology,  prior relevant surgical and non surgical procedures, recent  labs and imaging studies, counseling on weight management,  reviewing the assessment and plan with patient, and post visit ordering and reviewing of  diagnostics and therapeutics with patient  .   Follow-up: No follow-ups on file.   Dawn Russell [place], MD "

## 2024-06-07 NOTE — Patient Instructions (Signed)
"   I am treating you for bacterial sinusitis which is probably a  complication from a viral infection  given the duration of your symptoms    I am prescribing an antibiotic (levaquin )   To manage the infection and  prednisone  taper for the inflammation in your ear/sinuses.   I also advise use of the following OTC meds to help with your other symptoms.   Take generic Afrin nasal spray every 12 hours for 3 days, then ince dialy for 2 days then STOP THE AFRIN    Please take a probiotic ( Align, Flora que or [place]) OR A GENERIC EQUIVALENT for three weeks since you are taking an  antibiotic to prevent a very serious antibiotic associated infection  Called clostridium dificile colitis that can cause diarrhea, multi organ failure, sepsis and death if not managed.   "

## 2024-06-07 NOTE — Assessment & Plan Note (Signed)
 Given chronicity of symptoms, development of facial pain and exam consistent with bacterial URI,  Will treat with empiric antibiotics, decongestants, and PREDNISONE  TAPER

## 2024-06-09 ENCOUNTER — Encounter: Admitting: Internal Medicine

## 2024-06-09 MED ORDER — ALBUTEROL SULFATE HFA 108 (90 BASE) MCG/ACT IN AERS: INHALATION_SPRAY | RESPIRATORY_TRACT | 0 refills | Status: AC

## 2024-06-09 MED ORDER — ROSUVASTATIN CALCIUM 10 MG PO TABS: 10.0000 mg | ORAL_TABLET | Freq: Every day | ORAL | 1 refills | Status: AC

## 2024-06-09 NOTE — Telephone Encounter (Signed)
 Pt requesting refill on inhaler. Medication has been pended for approval. Crestor  has been seen as 90 day script

## 2024-10-04 ENCOUNTER — Ambulatory Visit: Admitting: Internal Medicine
